# Patient Record
Sex: Female | Born: 1960 | Race: White | Hispanic: No | Marital: Married | Smoking: Current every day smoker
Health system: Southern US, Community
[De-identification: ages and names within clinical notes are randomized; demographics above are authoritative.]

## PROBLEM LIST (undated history)

## (undated) DIAGNOSIS — J069 Acute upper respiratory infection, unspecified: Secondary | ICD-10-CM

## (undated) DIAGNOSIS — F419 Anxiety disorder, unspecified: Secondary | ICD-10-CM

## (undated) DIAGNOSIS — F32A Depression, unspecified: Secondary | ICD-10-CM

## (undated) DIAGNOSIS — M549 Dorsalgia, unspecified: Secondary | ICD-10-CM

## (undated) DIAGNOSIS — E785 Hyperlipidemia, unspecified: Secondary | ICD-10-CM

## (undated) DIAGNOSIS — E010 Iodine-deficiency related diffuse (endemic) goiter: Secondary | ICD-10-CM

## (undated) DIAGNOSIS — M5030 Other cervical disc degeneration, unspecified cervical region: Secondary | ICD-10-CM

## (undated) DIAGNOSIS — Z720 Tobacco use: Secondary | ICD-10-CM

## (undated) DIAGNOSIS — M519 Unspecified thoracic, thoracolumbar and lumbosacral intervertebral disc disorder: Secondary | ICD-10-CM

## (undated) DIAGNOSIS — E041 Nontoxic single thyroid nodule: Secondary | ICD-10-CM

## (undated) DIAGNOSIS — M545 Low back pain, unspecified: Secondary | ICD-10-CM

## (undated) DIAGNOSIS — F410 Panic disorder [episodic paroxysmal anxiety] without agoraphobia: Secondary | ICD-10-CM

## (undated) HISTORY — DX: Anxiety disorder, unspecified: F41.9

## (undated) HISTORY — DX: Other cervical disc degeneration, unspecified cervical region: M50.30

## (undated) HISTORY — DX: Depression, unspecified: F32.A

## (undated) HISTORY — DX: Hyperlipidemia, unspecified: E78.5

## (undated) HISTORY — DX: Unspecified thoracic, thoracolumbar and lumbosacral intervertebral disc disorder: M51.9

## (undated) HISTORY — DX: Iodine-deficiency related diffuse (endemic) goiter: E01.0

## (undated) HISTORY — DX: Nontoxic single thyroid nodule: E04.1

## (undated) HISTORY — DX: Panic disorder (episodic paroxysmal anxiety): F41.0

## (undated) HISTORY — DX: Tobacco use: Z72.0

## (undated) HISTORY — DX: Low back pain, unspecified: M54.50

## (undated) HISTORY — PX: NECK SURGERY: SHX720

## (undated) HISTORY — PX: ABDOMINAL HYSTERECTOMY: SHX81

## (undated) HISTORY — DX: Acute upper respiratory infection, unspecified: J06.9

## (undated) HISTORY — DX: Dorsalgia, unspecified: M54.9

## (undated) HISTORY — PX: BRAIN SURGERY: SHX531

## (undated) HISTORY — PX: TONSILLECTOMY AND ADENOIDECTOMY: SUR1326

---

## 2011-09-24 ENCOUNTER — Encounter: Payer: Self-pay | Admitting: Gastroenterology

## 2011-09-25 ENCOUNTER — Encounter: Payer: Self-pay | Admitting: Gastroenterology

## 2011-10-14 ENCOUNTER — Encounter: Payer: Self-pay | Admitting: Neurosurgery

## 2011-10-15 ENCOUNTER — Ambulatory Visit: Payer: Self-pay | Admitting: Neurosurgery

## 2011-10-15 ENCOUNTER — Encounter: Payer: Self-pay | Admitting: Neurosurgery

## 2011-10-15 VITALS — BP 94/56 | HR 76 | Ht 64.0 in | Wt 151.0 lb

## 2011-10-15 DIAGNOSIS — M5412 Radiculopathy, cervical region: Secondary | ICD-10-CM

## 2011-10-15 DIAGNOSIS — M545 Low back pain, unspecified: Secondary | ICD-10-CM

## 2011-10-15 NOTE — H&P (Signed)
History and Physical    HISTORY:  Chief Complaint   Patient presents with   . Back Pain         History of Present Illness:    HPI56 year old with a multiyear history of low back and neck pain.  Over the last few years the pain has progressively worsened.  She feels the neck pain is related to back pain, which bothers her more.  Back pain radiates to her left leg; anterior/lateral thigh, calf and into the toes which are numb.  The pain is there always, but is episodically more severe.  It is during these spikes in severity that she feels the leg is bad.  She has fallen at times when her left leg gives out due to pain.  With respect to her neck, the pain is, again always there, but worse when her back pain escalates.  The pain usually radiates to both scapulae, left greater than right and over her triceps.  She feels like her arms frequently "go numb".   She is here for surgical evaluation.    Problems:  There is no problem list on file for this patient.       Past Medical/Surgical History:   Past Medical History   Diagnosis Date   . Lumbar disc disease    . Disc disease, degenerative, cervical    . Depression    . Anxiety    . Hypertension    . Low back pain    . Hyperlipidemia    . Panic disorder    . Thyroid nodule    . Thyromegaly    . Tobacco abuse      Past Surgical History   Procedure Date   . Hysterectomy    . Ovary removal          Allergies:    Allergies   Allergen Reactions   . Codeine    . Morphine Sulfate    . Motrin (Ibuprofen Micronized)    . Percocet (Oxycodone-Acetaminophen)    . Vicodin (Hydrocodone-Acetaminophen)        Current medications:    Current Outpatient Prescriptions   Medication Sig   . cyclobenzaprine (FLEXERIL) 10 MG tablet Take 10 mg by mouth 3 times daily as needed       . cetirizine (ZYRTEC) 10 MG tablet Take 10 mg by mouth nightly       . ALPRAZolam (XANAX) 1 MG tablet Take 1 mg by mouth 4 times daily as needed       . zolpidem (AMBIEN) 10 MG tablet Take 10 mg by mouth nightly as  needed       . citalopram (CELEXA) 40 MG tablet Take 60 mg by mouth daily       . acetaminophen (TYLENOL) 500 mg tablet Take 500 mg by mouth Q4-6H PRN           Family History:    No family history on file.    Social/Occupational History:   History     Social History   . Marital Status: Married     Spouse Name: N/A     Number of Children: N/A   . Years of Education: N/A     Social History Main Topics   . Smoking status: Current Everyday Smoker -- 5.0 packs/day for 30 years     Types: Cigarettes   . Smokeless tobacco: Not on file   . Alcohol Use: Not on file   . Drug Use: Not on file   .  Sexually Active: Not on file     Other Topics Concern   . Not on file     Social History Narrative   . No narrative on file         Review of Systems:    Review of Systems   HENT: Positive for neck pain.    Gastrointestinal: Positive for heartburn.   Musculoskeletal: Positive for back pain, joint pain and falls.   Skin: Positive for itching.   Neurological: Positive for tingling, sensory change and headaches.         PHYSICAL EXAM:  Physical Exam   Constitutional: She appears well-developed.   HENT:   Head: Atraumatic.   Eyes: Pupils are equal, round, and reactive to light.   Musculoskeletal: She exhibits no edema and no tenderness.   Neurological: She is alert. She has normal reflexes. No sensory deficit. She exhibits normal muscle tone. Gait abnormal. Coordination normal.        4/5 in left triceps and biceps.  Left leg mildly weaker than the right; testing evokes discomfort.  Walks with antalgic gait.   Skin: Skin is warm.   Psychiatric: Her behavior is normal.       Vital Signs:   BP 94/56  Pulse 76  Ht 1.626 m (5\' 4" )  Wt 68.493 kg (151 lb)  BMI 25.92 kg/m2    Assessment:    There are no diagnoses linked to this encounter.   .      Plan:       *surgical evaluation*

## 2011-10-16 ENCOUNTER — Other Ambulatory Visit: Payer: Self-pay | Admitting: Neurosurgery

## 2011-10-16 DIAGNOSIS — M542 Cervicalgia: Secondary | ICD-10-CM

## 2011-10-16 NOTE — Letter (Signed)
October 15, 2011    Shari Prows, NP  [address]  [place], [place] [address]    RE:   [name], [name]  DOB:  [birth year]  Unit#: [id number]    Dear Barkley Bruns:    I have just seen Ms. Krystal Kramer.  She is a very nice lady with a long history  of back pain for 25+ years, neck pain in the last several years.  Again,  she is a difficult historian, as she associates her back and neck pain  strongly.  She states her back pain comes on and brings on her neck pain  and left entire body numbness and weakness.  She does note she does have  neck pain that shoots into her left arm.  Again, she notes weakness in her  left arm.    On exam, she does appear to have some weakness in the left biceps, wrist  extension, and wrist flexion.    Her MRI of the lumbar spine is frankly unremarkable but she does have what  appears to be some eccentric disk bulge to the left at C5-C6 with potential  foraminal compromise.  Also, she states she has been diagnosed with carpal  tunnel syndrome before.    Overall, given [name]'s somewhat confusing history but concerning  objective findings, I would like to get a better detailed anatomic study  with CT myelography.  I also would like to get an EMG of her upper  extremities to evaluate for the severity of her nerve compression  peripherally in this somewhat confusing patient.  I certainly think her  lumbar spine is relatively unremarkable with a small bulge at L4-L5, which  does not explain some of her dramatic symptoms.    Thanks so much for referring this very nice lady.    Sincerely,              Electronically Signed and Finalized by  Anna Genre, MD 10/20/2011 13:14  ____________________________________  Anna Genre, MD        DD:   10/15/2011  DT:   10/16/2011  7:37 A  DVI:  [id number]  RER/JR2#[id number]    cc:   *Shari Prows, NP

## 2011-11-28 ENCOUNTER — Ambulatory Visit: Payer: Self-pay | Admitting: Neurosurgery

## 2011-12-10 ENCOUNTER — Telehealth: Payer: Self-pay

## 2011-12-11 ENCOUNTER — Ambulatory Visit
Admit: 2011-12-11 | Discharge: 2011-12-11 | Disposition: A | Discharge status: Home / Self Care | Payer: Self-pay | Source: Ambulatory Visit | Attending: Neurosurgery | Admitting: Neurosurgery

## 2011-12-11 ENCOUNTER — Encounter: Payer: Self-pay | Admitting: Neurosurgery

## 2011-12-11 MED ORDER — MEPERIDINE HCL 75 MG/ML IJ SOLN *I*
75.0000 mg | Freq: Once | INTRAMUSCULAR | Status: AC
Start: 2011-12-11 — End: 2011-12-11
  Administered 2011-12-11: 75 mg via INTRAMUSCULAR

## 2011-12-11 MED ORDER — MIDAZOLAM HCL 2 MG/ML PO SYRP *I*
8.0000 mg | ORAL_SOLUTION | Freq: Once | ORAL | Status: AC
Start: 2011-12-11 — End: 2011-12-11
  Administered 2011-12-11: 8 mg via ORAL

## 2011-12-11 MED ORDER — MEPERIDINE HCL 75 MG/ML IJ SOLN *I*
INTRAMUSCULAR | Status: AC
Start: 2011-12-11 — End: 2011-12-11
  Filled 2011-12-11: qty 1

## 2011-12-11 MED ORDER — MIDAZOLAM HCL 2 MG/ML PO SYRP *I*
ORAL_SOLUTION | ORAL | Status: AC
Start: 2011-12-11 — End: 2011-12-11
  Filled 2011-12-11: qty 5

## 2011-12-16 ENCOUNTER — Ambulatory Visit: Payer: Self-pay | Admitting: Neurosurgery

## 2011-12-17 ENCOUNTER — Telehealth: Payer: Self-pay | Admitting: Neurosurgery

## 2011-12-23 ENCOUNTER — Ambulatory Visit: Payer: Self-pay | Admitting: Neurology

## 2011-12-23 ENCOUNTER — Encounter: Payer: Self-pay | Admitting: Neurology

## 2011-12-26 ENCOUNTER — Encounter: Payer: Self-pay | Admitting: Neurology

## 2012-01-06 ENCOUNTER — Encounter: Payer: Self-pay | Admitting: Neurology

## 2012-01-09 ENCOUNTER — Encounter: Payer: Self-pay | Admitting: Neurosurgery

## 2012-01-09 ENCOUNTER — Ambulatory Visit: Payer: Self-pay | Admitting: Neurosurgery

## 2012-01-09 VITALS — BP 108/70 | HR 100

## 2012-01-09 DIAGNOSIS — M4802 Spinal stenosis, cervical region: Secondary | ICD-10-CM | POA: Insufficient documentation

## 2012-01-09 HISTORY — DX: Spinal stenosis, cervical region: M48.02

## 2012-01-09 NOTE — Progress Notes (Signed)
 Pre op h&p done

## 2012-01-27 NOTE — Preop H&P (Signed)
OUTPATIENT  Chief Complaint: neck, left arm pain    History of Present Illness:  HPI  51 year old with neck pain which usually radiates to both scapulae, left greater than right and over her triceps. She feels like her arms frequently "go numb".  She has weakness in the left biceps, wrist extension, and wrist flexion.  MRI shows a disc bulge at C5-6 to the left.  Foraminal stenosis confirmed with CT myelogram.  Presents for elective surgery.      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  History     Social History    Marital Status: Married     Spouse Name: N/A     Number of Children: N/A    Years of Education: N/A     Social History Main Topics    Smoking status: Current Everyday Smoker -- 5.0 packs/day for 30 years     Types: Cigarettes    Smokeless tobacco: Not on file    Alcohol Use: Not on file    Drug Use: Not on file    Sexually Active: Not on file     Other Topics Concern    Not on file     Social History Narrative    No narrative on file       Allergies:   Allergies   Allergen Reactions    Morphine Sulfate Itching    Codeine Nausea And Vomiting    Motrin (Ibuprofen Micronized) Nausea And Vomiting    Percocet (Oxycodone-Acetaminophen) Nausea And Vomiting    Vicodin (Hydrocodone-Acetaminophen) Nausea And Vomiting       Current Outpatient Prescriptions   Medication    naproxen (NAPROSYN) 375 MG tablet    cyclobenzaprine (FLEXERIL) 10 MG tablet    cetirizine (ZYRTEC) 10 MG tablet    ALPRAZolam (XANAX) 1 MG tablet    zolpidem (AMBIEN) 10 MG tablet    citalopram (CELEXA) 40 MG tablet    acetaminophen (TYLENOL) 500 mg tablet         Review of Systems:   Review of Systems   HENT: Positive for neck pain.    Musculoskeletal: Positive for back pain.       Last Nursing documented pain:        No data found.           Physical Exam   Constitutional: She appears well-developed.   HENT:   Head: Atraumatic.   Eyes: Pupils are equal, round, and reactive to light.   Neck:        ROM limited by pain   Cardiovascular: Normal rate, regular rhythm, normal heart sounds and intact distal pulses.  Exam reveals no gallop and no friction rub.    No murmur heard.  Pulmonary/Chest: Breath sounds normal.   Abdominal: Bowel sounds are normal.   Musculoskeletal: She exhibits no edema and no tenderness.   Neurological: She is alert. She has normal reflexes. No  cranial nerve deficit or sensory deficit. Coordination normal.        Mild weakness left biceps, wrist flex/extension   Skin: Skin is warm.   Psychiatric: Her behavior is normal.       Lab Results: labs done at outside lab; scanned into erecord.    Radiology impressions (last 3 days):  No results found.    Currently Active/Followed Hospital Problems:  There are no hospital problems to display for this patient.      Assessment: 51 year old with C5-6 disc bulge to the left with foraminal impingement    Plan: MISS left posterior C5-6 foraminotomy    Author: Glennie Hawk Karlisa Gaubert, NP  Note created: 01/27/2012  at: 5:32 PM

## 2012-01-27 NOTE — Discharge Instructions (Signed)
CERVICAL SPINE SURGERY (MINIMALLY INVASIVE) DISCHARGE INFORMATION    You can expect to have good and bad days over the next  2 weeks.  The surgical area will have some swelling and soreness which will take about 2 weeks to settle down.  You should gradually begin to feel better.  Nerves heal very slowly; you may actually notice slow improvement for up to over year.        Activity    Do not stay in one position longer than two hours during the day, whether this is sitting or lying down. This could contribute to stiffness and increased pain.      Take frequent walks daily, gradually increasing the time and distance.  Short, more         frequent activities are better tolerated than one long activity      You may go up and down stairs.      Lifting may not exceed thirty pounds for 6 weeks.  Follow this rule:  if it hurts...DON'T do it!!     You may return to work within a week with the above restrictions.       Do not drive for one week or while you are still taking narcotic pain medication.        Wound care    The dressing/bandages should be removed the day after surgery and the incision left open to air.  Do not use creams, lotions, or ointments on the incision.       Your incision was closed with sutures under the skin, the sutures will absorb and do not need to be removed.  There may be steri-strips (thin pieces of tape) on the incision.  These may be removed on the eighth day after your surgery.      The incision should be kept dry for three days.  After 3 days you may shower without protecting the incision.  No tub baths, swimming or hot tub use for 4 weeks after surgery or while on narcotic pain medications.      Comfort    Pain medication will be prescribed to manage your immediate post surgical pain; generally prescriptions are given to get you through until your surgical follow up appointment. Pain medication refills cannot be called in. Please notify your doctor's office 1 week in advance of needing a  refill so the prescription can be mailed to you.  No refills on prescriptions will be given after office hours or on weekends!    Muscle spasms are common with the surgery you had and can be quite painful.  If you experience muscle spasms you may apply ice to your neck area. Be sure to use a towel between the ice and skin to prevent injury to your skin.  You may be given a prescription for a muscle relaxer at discharge.    If you are still having pain after the expected post operative period we may refer you to a pain management group to better treat your pain.    Other    You should drink plenty of fluids and use a stool softener or laxative daily to ensure regular bowel movements after surgery.  These are available at your pharmacy without a prescription.  Do not go more than 2 days without using one of the medications to avoid significant constipation.    Call your physician's office to schedule a post-operative follow-up appointment if one has not been made for you.          CALL YOUR PHYSICIAN IF YOU DEVELOP:    Fever greater than 101.5 for 24 hours, bleeding, redness, swelling or drainage from the incision site.    Difficulty with urination or bowel movements.    Sudden, severe pain that is not relieved with pain medication or new pain, numbness or weakness in your legs.    Difficulty swallowing or breathing.

## 2012-02-06 ENCOUNTER — Ambulatory Visit
Admit: 2012-02-06 | Discharge: 2012-02-06 | Disposition: A | Discharge status: Home / Self Care | Payer: Self-pay | Source: Ambulatory Visit | Attending: Neurosurgery | Admitting: Neurosurgery

## 2012-02-06 MED ORDER — ALPRAZOLAM 0.25 MG PO TABS *I*
ORAL_TABLET | ORAL | Status: DC
Start: 2012-02-06 — End: 2012-02-07
  Filled 2012-02-06: qty 4

## 2012-02-06 MED ORDER — PROMETHAZINE HCL 25 MG/ML IJ SOLN *I*
6.2500 mg | Freq: Once | INTRAMUSCULAR | Status: AC | PRN
Start: 2012-02-06 — End: 2012-02-06
  Administered 2012-02-06: 6.25 mg via INTRAVENOUS

## 2012-02-06 MED ORDER — PROMETHAZINE HCL 25 MG/ML IJ SOLN *I*
INTRAMUSCULAR | Status: DC
Start: 2012-02-06 — End: 2012-02-07
  Filled 2012-02-06: qty 1

## 2012-02-06 MED ORDER — LIDOCAINE HCL 1 % IJ SOLN
0.1000 mL | INTRAMUSCULAR | Status: DC | PRN
Start: 2012-02-06 — End: 2012-02-07
  Administered 2012-02-06: 0.1 mL via SUBCUTANEOUS

## 2012-02-06 MED ORDER — SODIUM CHLORIDE 0.9 % IV SOLN WRAPPED *I*
20.0000 mL/h | Status: DC
Start: 2012-02-06 — End: 2012-02-07
  Administered 2012-02-06: 20 mL/h via INTRAVENOUS

## 2012-02-06 MED ORDER — CEFAZOLIN SODIUM 1000 MG IJ SOLR *I*
2000.0000 mg | INTRAMUSCULAR | Status: AC
Start: 2012-02-06 — End: 2012-02-06
  Administered 2012-02-06: 2000 mg via INTRAVENOUS

## 2012-02-06 MED ORDER — ALPRAZOLAM 0.25 MG PO TABS *I*
1.0000 mg | ORAL_TABLET | Freq: Once | ORAL | Status: AC | PRN
Start: 2012-02-06 — End: 2012-02-06
  Administered 2012-02-06: 1 mg via ORAL

## 2012-02-06 MED ORDER — ONDANSETRON HCL 2 MG/ML IV SOLN *I*
1.0000 mg | Freq: Once | INTRAMUSCULAR | Status: AC | PRN
Start: 2012-02-06 — End: 2012-02-06
  Administered 2012-02-06: 1 mg via INTRAVENOUS

## 2012-02-06 MED ORDER — HYDROMORPHONE HCL 2 MG/ML IJ SOLN *WRAPPED*
0.4000 mg | INTRAMUSCULAR | Status: DC | PRN
Start: 2012-02-06 — End: 2012-02-07
  Administered 2012-02-06: 0.4 mg via INTRAVENOUS

## 2012-02-06 MED ORDER — NAPROXEN 375 MG PO TABS *I*
375.0000 mg | ORAL_TABLET | Freq: Three times a day (TID) | ORAL | Status: DC
Start: 2012-02-06 — End: 2016-08-20

## 2012-02-06 MED ORDER — ONDANSETRON HCL 2 MG/ML IV SOLN *I*
INTRAMUSCULAR | Status: AC
Start: 2012-02-06 — End: 2012-02-06
  Filled 2012-02-06: qty 2

## 2012-02-06 MED ORDER — HYDROMORPHONE HCL PF 1 MG/ML IJ SOLN *WRAPPED*
INTRAMUSCULAR | Status: DC
Start: 2012-02-06 — End: 2012-02-07
  Filled 2012-02-06: qty 1

## 2012-02-06 NOTE — INTERIM OP NOTE (Signed)
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

## 2012-02-06 NOTE — Anesthesia Post-procedure Eval (Signed)
Anesthesia Post-op Note    Patient: Liliah Torchio    Procedure(s) Performed: Cervical formaotomy    Anesthesia type: General    Patient location: PACU    Mental Status: Recovered to baseline    Patient able to participate in this evaluation: yes  Last Vitals: BP: 133/78 mmHg (02/06/12 1800)  BP MAP : 91 mmHg (02/06/12 1800)  Heart Rate: 96  (02/06/12 1800)  Temp: 36 C (96.8 F) (02/06/12 1758)  Resp: 14  (02/06/12 1758)  Height: 162 cm (5' 3.78") (02/06/12 1300)  weight: 71.1 kg (156 lb 12 oz) (02/06/12 1300)  BMI (Calculated): 27.1  (02/06/12 1300)  SpO2: 98 % (02/06/12 1800)      Post-op vital signs noted above are within patient's normal range  Post-op vitals signs: stable  Respiratory function: baseline    Airway patent: Yes    Cardiovascular and hydration status stable: Yes    Post-Op pain: Adequate analgesia    Post-Op assessment: no apparent anesthetic complications, tolerated procedure well and no evidence of recall    Complications: none    Attending Attestation: All indicated post anesthesia care provided    Author: Lenon Oms, MD  as of: 02/06/2012  at: 6:10 PM

## 2012-02-06 NOTE — Progress Notes (Addendum)
1910:  Patient returned from PACU via stretcher.  Report obtained from PACU RN.  Patient awake and vital signs stable.  Patient assisted out of bed to chair.  IV patent.  Patient with complaints of nausea.  Patient c/o 6/10 neck pain.  Incision site benign with dermabond intact.   Starting po.  Family visiting with patient.  Will continue to monitor.   1920:  Patient states she feels like "I can't take a deep breath".  Incentive spirometer teaching done.  Patient states current 1/2 pack/day smoker.  Sleepy in recliner.  Anes. Grimmes Notified.  Medicated for pain & nausea.  2L nasal cannula applied for comfort.  O2 sat 97%.  Appears comfortable.    1950:  Patient states pain is better 0/10.  Sleepy using incentive spirometer.  1L nasal cannula 97%  No SOB.    2000:  Resident called for patients usual anxiety medication xanax.  Patient resting comfortably no pain.  Requesting prescription, resident states plan was to send home with Tylenol and flexeril.  Patient has several narcotic allergies.  2100:  Patient states readiness for discharge.  IV discontinued.  Patient tolerating good oral intake.  Patient denies nausea.  Patient denies pain.  Patient assisted out of bed with steady gait.  Voided good.  Discharge instructions reviewed with patient and husband. Verbalized good understanding.   Chester Holstein, BSN, Charity fundraiser, [employer]

## 2012-02-06 NOTE — H&P (Signed)
UPDATES TO PATIENT'S CONDITION on the DAY OF SURGERY/PROCEDURE    I. Updates to Patient's Condition (to be completed by a provider privileged to complete a H&P, following reassessment of the patient by the provider):    Day of Surgery/Procedure Update:  History  History reviewed and no change    Physical  Physical exam updated and no change              II. Procedure Readiness   I have reviewed the patient's H&P and updated condition. By completing and signing this form, I attest that this patient is ready for surgery/procedure.      III. Attestation   I have reviewed the updated information regarding the patient's condition and it is appropriate to proceed with the planned surgery/procedure.    Junius Argyle Rickie Gange as of 3:35 PM 02/06/2012

## 2012-02-06 NOTE — Anesthesia Pre-procedure Eval (Signed)
Anesthesia Pre-operative Evaluation for Krystal Kramer    51 year old female with cervical degenerative disc disease with associated formainal stenosis, presents for left C5-C6 posterior foraminotomy.    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     Past Surgical History   Procedure Date    Hysterectomy     Ovary removal      Social History  History   Substance Use Topics    Smoking status: Current Everyday Smoker -- 5.0 packs/day for 30 years     Types: Cigarettes    Smokeless tobacco: Not on file    Alcohol Use: Not on file      History   Drug Use Not on file     ______________________________________________________________________  Allergies:   Allergies   Allergen Reactions    Morphine Sulfate Itching    Codeine Nausea And Vomiting    Environmental Allergies Itching     [place], pets, and dust cause watery, itchy eyes, and stuffy nose     Motrin (Ibuprofen Micronized) Nausea And Vomiting    Percocet (Oxycodone-Acetaminophen) Nausea And Vomiting    Vicodin (Hydrocodone-Acetaminophen) Nausea And Vomiting      Cannot display prior to admission medications because the patient has not been admitted in this contact.        Current Outpatient Prescriptions   Medication Sig    cyclobenzaprine (FLEXERIL) 10 MG tablet Take 10 mg by mouth 3 times daily as needed        cetirizine (ZYRTEC) 10 MG tablet Take 10 mg by mouth nightly        ALPRAZolam (XANAX) 1 MG tablet Take 1 mg by mouth 4 times daily as needed        zolpidem (AMBIEN) 10 MG tablet Take 10 mg by mouth nightly as needed        citalopram (CELEXA) 40 MG tablet Take 60 mg by mouth daily        acetaminophen (TYLENOL) 500 mg tablet Take 500 mg by mouth Q4-6H PRN        naproxen  (NAPROSYN) 375 MG tablet Take 1 tablet (375 mg total) by mouth 3 times daily (with meals)   You may resume this in one week.     Current Facility-Administered Medications   Medication    sodium chloride IV    lidocaine 1 % injection 0.1 mL    ceFAZolin (ANCEF) injection 2,000 mg     Admission Medications:  Scheduled Meds        cefazolin IV     IV Meds   PRN Meds        lidocaine 0.1 mL at 02/06/12 1348     Anesthesia EvaluationInformation Source: per records  General  Pertinent (-):  history of anesthetic complications, FamHx of anesthetic complications, contact precautions, anesthesia monitoring restrictions or communication issues    HEENT  Pertinent (-):   anatomic issues    Pulmonary     + Smoker (150 pack year smoker)            currently  Pertinent (-):  asthma  or sleep apnea Cardiovascular  Good(4+METs) Exercise Tolerance  Pertinent (-):  hypertension, angina, CAD or valvular disease    Comment:H/o HLD    GI/Hepatic/Renal  Last PO Intake: >8hr before procedure      + GERD            well controlled  Pertinent (-):  liver  issues or renal issues Neuro/Psych    + Chronic pain          lower back    + Psychiatric Issues (h/o panic d/o; takes xanax PRN and celexa)          anxiety, depression    + Neuromuscular disease (neuropathy of the upper extremities bilaterally )  Pertinent (-):  dizziness/motion sickness    Comment: H/o degenerative disc disease (cervical), cervical foraminal stenosis, and lumbar disc disease    Endo/Other    +Thyroid Disease (h/o thyroid nodule and thyromegaly; not currently on any medications)  Pertinent (-):  diabetes mellitus         Nursing Reported PO Status: Date Last PO Fluids: 02/06/12 1215  Date Last PO Solids: 02/05/12 2000  ______________________________________________________________________  Physical Exam    Airway            Mouth opening: normal            Mallampati: II            TM distance (fb): >3 FB            Neck ROM: full  Dental   Normal Exam   Upper:  dentures   Cardiovascular           Rhythm: regular           Rate: normal  No murmur, friction rub or systolic click     Pulmonary     breath sounds clear to auscultation    No cough, rhonchi, decreased breath sounds, wheezes    Mental Status     oriented to person, place and time    depressed    Not confused, anxious, depressed         Most Recent Vitals: BP: 120/76 mmHg (02/06/12 1344)  Heart Rate: 101  (02/06/12 1300)  Temp: 37.1 C (98.8 F) (02/06/12 1300)  Resp: 18  (02/06/12 1300)  Height: 162 cm (5' 3.78") (02/06/12 1300)  weight: 71.1 kg (156 lb 12 oz) (02/06/12 1300)  BMI (Calculated): 27.1  (02/06/12 1300)  SpO2: 96 % (02/06/12 1300)    Vital Sign Ranges (last 24hrs)  Temp:  [37.1 C (98.8 F)] 37.1 C (98.8 F)  Heart Rate:  [101] 101   Resp:  [18] 18   BP: (120)/(76) 120/76 mmHg   O2 Device: None (Room air) (02/06/12 1344)    _______________________________________________________________________  Medical Problems  Patient Active Problem List   Diagnoses Date Noted    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       PreOp/PreProcedure Diagnosis (For more detail see procedural consent)            C5-6 degenerative disc disease with formainal stenosis  Planned Procedure (For more detail see procedural consent)            Left C5-C6 posterior foraminotomy  Plan   ASA Score 2  Anesthetic Plan (general); Induction (routine IV); Airway (cuffed ETT); Line ( use current access); Monitoring (standard ASA); Positioning (prone); Pain (per surgical team); PostOp (PACU)    Informed Consent     Risks:  Risks discussed were commensurate with the plan listed above with the following specific points:  N/V, sore throat , damage to:(nerves, teeth, eyes), allergic Rx, unexpected serious injury    Anesthetic Consent:         Anesthetic plan and risks discussed with:  patient      Attending Attestation: The patient or proxy understand and accept the risks and benefits of the anesthesia plan. By accepting this  note, I attest that I have personally performed the history and physical exam and prescribed the anesthetic plan within 48 hours prior to the anesthetic as documented by me above.    Author: Lenon Oms, MD

## 2012-02-06 NOTE — Progress Notes (Signed)
ARRIVES FROM PACU UP TO RECLINER.  DRESSING DERMABOND ON BACK OF NECK 1 INCH LONG DRY AND INTACT.  C/O NAUSEA AND PAIN. PT TO BE MEDICATED WITH DILAUDID AND PAIN. 6/10

## 2012-02-10 MED FILL — Lorazepam Inj 2 MG/ML: INTRAMUSCULAR | Qty: 1 | Status: AC

## 2012-02-10 MED FILL — Propofol IV Emul 10 MG/ML: INTRAVENOUS | Qty: 20 | Status: AC

## 2012-02-10 MED FILL — Midazolam HCl Inj 2 MG/2ML (Base Equivalent): INTRAMUSCULAR | Qty: 2 | Status: AC

## 2012-02-10 MED FILL — Ondansetron HCl Inj 4 MG/2ML (2 MG/ML): INTRAMUSCULAR | Qty: 2 | Status: AC

## 2012-02-10 MED FILL — Metoprolol Tartrate IV Soln 5 MG/5ML: INTRAVENOUS | Qty: 5 | Status: AC

## 2012-02-10 MED FILL — Glycopyrrolate Inj 0.2 MG/ML: INTRAMUSCULAR | Qty: 2 | Status: AC

## 2012-02-10 MED FILL — Thrombin For Soln 5000 Unit: CUTANEOUS | Qty: 5000 | Status: AC

## 2012-02-10 MED FILL — Bupivacaine HCl Inj 0.25%: INTRAMUSCULAR | Qty: 30 | Status: AC

## 2012-02-10 MED FILL — Rocuronium Bromide IV Soln 50 MG/5ML (10 MG/ML): INTRAVENOUS | Qty: 5 | Status: AC

## 2012-02-10 MED FILL — Methylprednisolone Acetate PF Inj Susp 80 MG/ML: INTRAMUSCULAR | Qty: 1 | Status: AC

## 2012-02-10 MED FILL — Fentanyl Citrate Inj 0.05 MG/ML: INTRAMUSCULAR | Qty: 5 | Status: AC

## 2012-02-10 MED FILL — Lidocaine Inj 1% w/ Epinephrine-1:100000: INTRAMUSCULAR | Qty: 50 | Status: AC

## 2012-02-10 MED FILL — Lidocaine HCl Local Preservative Free (PF) Inj 2%: INTRAMUSCULAR | Qty: 5 | Status: AC

## 2012-02-10 MED FILL — Ketorolac Tromethamine Inj 30 MG/ML: INTRAMUSCULAR | Qty: 1 | Status: AC

## 2012-02-10 NOTE — Op Note (Signed)
Krystal Kramer, Krystal Kramer  MR #:  [id number]   ACCOUNT #:  [id number] DOB:  [birth year]    AGE:  51     SURGEON:  Anna Genre, MD  CO-SURGEON:    ASSISTANT:  Gerlene Burdock, MD,RES  SURGERY DATE:  02/06/2012    DIAGNOSIS:  Left C5-C6 foraminal stenosis.    OPERATIVE PROCEDURE:    1. Left C5-C6 minimally invasive foraminotomy.   2. Use of intraoperative microscope.    HISTORY:  The patient is a woman with history of neck and left arm pain, cervical myelogram demonstrating severe foraminal stenosis, left C5-C6, planned for foraminotomy.    DESCRIPTION OF PROCEDURE:  After obtaining informed consent, the patient was taken to the operative suite.  She was placed in general endotracheal anesthesia in [place] fixation pins and flipped prone with the neck slightly flexed and held in place.  The back and neck were then prepped and draped in a sterile manner.       A lumbar spinal needle was then placed through use of lateral fluoroscopy to confirm the level to be correct at C5-C6.  A small, approximately 12 mm, linear skin incision was then made to the left of midline at C5-C6 after infiltrating with 1% lidocaine with 1:100,000 dilution epinephrine.  Skin incision made down through the skin, subcutaneous tissues, and posterior cervical fascia.  Dilator was then placed onto the lamina-facet junction at C5-C6.  Finally, a 12 mm diameter access port was docked at C5-C6 and held in place with self-retaining retraction system.       At this point, the high-power microscope was then brought into the field.  The small plug of tissue at the base of the exposure was then removed.  Hemilaminotomies at C5-C6 were then performed with high-powered air drill and Kerrison punches.  By progressively angling the retractor more lateral, the medial facet was identified and drilled, as well as using Kerrison punches to create a medial facetectomy.  Ready visualization of the lateral aspect of thecal sac and the nerve root was  readily identified and noted to be quite tight in the foramen until this was opened.  Once this was decompressed with Kerrison rongeurs, the nerve root was noted to be free from compression on inspection and palpation.  At this point, immaculate epidural hemostasis was then achieved and the wound then closed in layers, with interrupted 2-0 Vicryl sutures to the lumbodorsal fascia.  The paraspinous musculature infiltrated with 0.25% Marcaine, 2 mg of Ativan, 3 mg of Toradol, 10 cc total volume.  Subcutaneous tissue closed with 2-0 Vicryl sutures, 4-0 running intradermal suture used, and then Dermabond placed to the skin.  Wound dressed sterilely.  Patient taken in stable condition to recovery.       It should be noted, a pledget Gelfoam soaked in Depo-Medrol placed over the exposed nerve root as well.    PREOPERATIVE DIAGNOSIS:      ANESTHESIA:               ______________________________  Anna Genre, MD    RER/MODL  DD:  02/10/2012 10:56:07  DT:  02/10/2012 13:38:14  Job #:  [id number]

## 2012-03-16 ENCOUNTER — Encounter: Payer: Self-pay | Admitting: Neurosurgery

## 2012-03-23 ENCOUNTER — Encounter: Payer: Self-pay | Admitting: Neurosurgery

## 2012-03-23 ENCOUNTER — Ambulatory Visit: Payer: Self-pay | Admitting: Neurosurgery

## 2012-03-23 VITALS — BP 110/62 | HR 76 | Ht 64.0 in | Wt 161.0 lb

## 2012-03-23 DIAGNOSIS — M4802 Spinal stenosis, cervical region: Secondary | ICD-10-CM

## 2012-03-23 NOTE — Progress Notes (Signed)
Dear Dr Marnette Burgess,    I had the pleasure of seeing Krystal Kramer in our neurosurgery clinic today.  She is 6 weeks post Left C5-C6 minimally invasive foraminotomy.      [name] says her neck pain and arm symptoms have resolved.  Her only complaint is her low back pain radiating to the left leg which she has had for over a year.      [name]'s incision is completely healed.  She had 5/5 strength in both upper extremities.    [name] will return in June for follow up on her back.  At that point, after she is a few months post op, we will look at her lumbar spine and see if more imaging is indicated.    Thank you for the opportunity to participate in the care of your patient.  If you have any questions or concerns please don't hesitate to call our office.    Sincerely,        Aundra Millet, NP

## 2012-04-06 ENCOUNTER — Telehealth: Payer: Self-pay | Admitting: Neurosurgery

## 2012-04-06 NOTE — Telephone Encounter (Signed)
Pt is calling because she is in severe pain and would like to know what she can do to relieve it best... Please advise. Thanks

## 2012-04-06 NOTE — Telephone Encounter (Signed)
Patient says her back "went out".  She has severe stabbing pain in her leg.  Her PCP office advised her to go to ED or see the NP in [place].  Advised her to do either of those.

## 2016-07-16 ENCOUNTER — Encounter: Payer: Self-pay | Admitting: Gastroenterology

## 2016-08-20 ENCOUNTER — Encounter: Payer: Self-pay | Admitting: Neurosurgery

## 2016-08-20 ENCOUNTER — Ambulatory Visit: Payer: Self-pay | Admitting: Neurosurgery

## 2016-08-20 VITALS — BP 96/58 | HR 98 | Ht 64.0 in | Wt 137.0 lb

## 2016-08-20 DIAGNOSIS — M5126 Other intervertebral disc displacement, lumbar region: Secondary | ICD-10-CM

## 2016-08-20 DIAGNOSIS — M5416 Radiculopathy, lumbar region: Secondary | ICD-10-CM

## 2016-08-20 DIAGNOSIS — M48062 Spinal stenosis, lumbar region with neurogenic claudication: Secondary | ICD-10-CM

## 2016-08-20 NOTE — Progress Notes (Signed)
,          Tama Gander, DO  NEUROSURGERY     Brain, Complex spine and Minimally Invasive Surgery.    [place],  [address]  Ph: [phone]  Fax: [phone]             08/20/16      RE:  Krystal Kramer, December 04, 1960    Dear Dr. Marlane Mingle,    I had the pleasure of seeing Krystal Kramer today 08/20/2016  In consultation at my [place], [place] office with regards to low back pain.    Krystal Kramer is a 55 y.o. right-handed female with history of posterior cervical foraminotomy by Dr Marcille Blanco in 2013.    She is here today for evaluation of low back and leg pain. She complains of low back and leg pain for few months, she complains of right anterior/lateral thigh pain but also complains of b/l posterior leg pain that is worse with standing and walking. Leg pain is >> back pain. She denies any preceding trauma. She denies numbness/weakness in the legs. Rest and heat usually eases the pain. Rates pain 8/10, dull constant ache, over the last few weakness she has noted some paresthesia in the legs.    She did not have any conservative treatment so far. No physical therapy or pain injections so far.        No weakness, numbness, tingling, fever, new bowel or bladder problems.    Past Medical History:   Past Medical History:   Diagnosis Date    Anxiety     Depression     Disc disease, degenerative, cervical     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    Hyperlipidemia     Low back pain     Lumbar disc disease     Panic disorder     Thyroid nodule     Thyromegaly     Tobacco abuse      Past Surgical History:   Past Surgical History:   Procedure Laterality Date    bone spur      HYSTERECTOMY      OVARY REMOVAL      RECTAL SURGERY      TONSILLECTOMY AND ADENOIDECTOMY       Allergies:   Allergies   Allergen Reactions    Morphine Sulfate Itching    Codeine Nausea And Vomiting    Environmental Allergies Itching     Pollan, pets, and dust cause watery, itchy  eyes, and stuffy nose     Motrin [Ibuprofen Micronized] Nausea And Vomiting    Percocet [Oxycodone-Acetaminophen] Nausea And Vomiting    Vicodin [Hydrocodone-Acetaminophen] Nausea And Vomiting    Darvon [Propoxyphene] Other (See Comments)     Darvocet n       Medications:  Current Outpatient Prescriptions   Medication    citalopram (CELEXA) 40 MG tablet    ALPRAZolam (XANAX) 2 MG tablet    QUEtiapine (SEROQUEL) 100 MG tablet    gabapentin (NEURONTIN) 300 MG capsule     No current facility-administered medications for this visit.      Social History:  Social History     Social History    Marital status: Married     Spouse name: N/A    Number of children: N/A    Years of education: N/A     Social History Main Topics    Smoking status: Current Every Day Smoker  Packs/day: 5.00     Years: 30.00     Types: Cigarettes    Smokeless tobacco: Never Used    Alcohol use None    Drug use: None    Sexual activity: Not Asked     Other Topics Concern    None     Social History Narrative     Family History:  No family history on file.    Review of Symptoms:  Twelve systems have been reviewed and pertinent positives noted in HPI.  All other systems negative.    BP 96/58   Pulse 98   Ht 1.626 m (5\' 4" )   Wt 62.1 kg (137 lb)   BMI 23.52 kg/m2   Pain    08/20/16 1328   PainSc:   3   PainLoc: Back     Physical Exam:  Well-developed. No acute distress.  Eyes:  EOMI, sclera clear  Neck: Supple.  Non-tender. no pain with motion    Cardiovascular:  No cyanosis, good capillary refill.  Pulmonary:  Clear, even, and easy respirations.    Skin:  Warm, dry, and pink.  Musculoskeletal:   Normal tone.  No atrophy.  No fasciculations.       Neurological exam:      Alert and oriented to person, place and time.  Speech is clear and fluent.    Cranial nerves II-XII intact.      Strength:     Upper Extremities Deltoid Biceps Triceps WE WF Int   Right 5 5 5 5 5 5    Left 5 5 5 5 5 5      Lower Extremities Hip Flexion Knee Flexion Knee  Extension DF PF EHL   Right 5 5 5 5 5 5    Left 5 5 5 5 5 5        Sensory exam normal    Gait is narrow-based and steady.      DTR 2+ biceps, 2+ triceps, 2+ brachioradialis,     2+ patellar, 2+ Achilles'.      No clonus.  Hoffman's negative.      Toes down.  Romberg negative.  No dysmetria.      Positive right SLR.    Imaging:     I personally reviewed the MRI of lumbar spine dated 07-02-2016 that shows L2-3 herniated disc causing right lateral recess stenosis and moderate stenosis at L4-5     Impression/Plan: 55 y.o. female with lumbar stenosis and radiculopathy,    I reviewed the MRI with her , her right leg pain is concordant with right L 2-3 HNP, she also has some claudication symptoms.    I recommend conservative treatment first as she had not had any treatment so far. I gave referrals for PT and PMR.    She will follow  Up with me after the above, if she is not better then she will be a candidate for surgery.    Should you have any questions regarding my plan, please do not hesitate to contact me personally.    Thank you for this opportunity to care for your patient.    Sincerely,    Ocie Doyne, DO  08/20/16  2:21 PM

## 2017-03-06 ENCOUNTER — Encounter: Payer: Self-pay | Admitting: Gastroenterology

## 2017-03-11 ENCOUNTER — Ambulatory Visit: Payer: No Typology Code available for payment source | Attending: Internal Medicine | Admitting: Surgery

## 2017-03-11 ENCOUNTER — Encounter: Payer: Self-pay | Admitting: Surgery

## 2017-03-11 VITALS — BP 115/65 | HR 72 | Temp 98.3°F | Ht 64.0 in | Wt 159.7 lb

## 2017-03-11 DIAGNOSIS — N823 Fistula of vagina to large intestine: Secondary | ICD-10-CM

## 2017-03-11 NOTE — Progress Notes (Signed)
Dear Krystal Kramer,     Thank you for this request of surgical consultation. As you know, Krystal Kramer is a pleasant 56 y.o. 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  She did have episiotomy after the birth of her last children and had vaginal deliveries for all her children.  She thinks that she has had 2 procedures but does not remember who did them were exactly where or when.  Patient does endorse having 3 bowel movements a week which are formed but always stool evacuate some of the vagina and the rectum.  Patient has no colonoscopy on file.    Allergies:  She is allergic to morphine sulfate; codeine; environmental allergies; motrin [ibuprofen micronized]; percocet [oxycodone-acetaminophen]; vicodin [hydrocodone-acetaminophen]; and darvon [propoxyphene].    Medications:  She has a current medication list which includes the following prescription(s): citalopram, alprazolam, quetiapine, gabapentin, cyclobenzaprine, famotidine, meclizine, and simvastatin.    Past Medical History, Surgical History, Family History, and Social History had been reviewed.  Past surgical history of hysterectomy in 2008, and tonsillectomy in 2017.    Review of Systems:   Remainder of ten system ROS is negative    Physical Exam:  Her height is 1.626 m (5\' 4" ) and weight is 72.4 kg (159 lb 11.2 oz). Her temporal temperature is 36.8 C (98.3 F). Her blood pressure is 115/65 and her pulse is 72. Her oxygen saturation is 96%.     General: Well-appearing in no acute distress  HEENT: Sclerae anicteric, extraocular muscles are intact, mucous membranes are moist  Neck: Supple without lymphadenopathy or JVD  Cardio: Regular rate and rhythm, no murmurs, rubs, or gallops  Chest: Clear to auscultation bilaterally  Abdomen: Soft, nontender, nondistended  Extremities: Warm bilaterally without edema, cyanosis, or clubbing  Back: No spinous process tenderness, no  paraspinal muscle tenderness  Neuro: Alert oriented x 3, cranial nerves II through XII grossly intact    Rectal:  Evidence of scar on perineum consistent with episiotomy.  DRE moderate tone.  Anteriorly in the sphincter there feels to be a divit.  No masses.  No palpable defect in posterior vagina  Anoscopy: Normal mucosa, no evidence of obvious internal opening although only gently probed with a q-tip.      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  She needs to have a colonoscopy.  We will need to do an endorectal ultrasound and subsequently an EUA.  We willl then make plans for repair afterwards.  However, prior to surgery, she will need to stop smoking for at least 4 weeks to optimize the outcome.      Once again, thank you Dr.Ramaswamy for this request of surgical consultation.  Please feel free to contact our office with any questions or concerns.      With best personal regards,      Esau Grew , MD, MSc, Allegra Grana, FASCRS  Division of Colorectal Surgery Chief   Professor of Surgical Oncology

## 2017-04-18 ENCOUNTER — Telehealth: Payer: Self-pay | Admitting: Surgery

## 2017-04-18 NOTE — Telephone Encounter (Signed)
Left message for patient that appointment on 5/24 at 2:30pm has been moved to [facility].  Asked to call back to confirm new time

## 2017-04-24 ENCOUNTER — Ambulatory Visit: Payer: No Typology Code available for payment source | Admitting: Surgery

## 2017-05-21 ENCOUNTER — Telehealth: Payer: Self-pay | Admitting: Surgery

## 2017-05-21 NOTE — Telephone Encounter (Signed)
-----   Message from Jackquline Bosch, NP sent at 05/20/2017 11:15 AM EDT -----  Butch Penny,    Please schedule a colonoscopy for this patient. 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  Thanks

## 2017-05-21 NOTE — Telephone Encounter (Signed)
Spoke to pt who didn`t wish to schedule their Colonoscopy as per recommendation below.

## 2017-08-15 ENCOUNTER — Telehealth: Payer: Self-pay | Admitting: Surgery

## 2017-08-15 NOTE — Telephone Encounter (Signed)
Contacted patient to inquire how she was doing and if she was interested in pursuing care with our group. She was initially seen and recommended to have a colonoscopy, rectal [place] and to quit smoking in preparation for repair of her colovaginal fistula. She did not  respond to our attempts to schedule a colonoscopy. Today she simply stated that she sis not want to h ave care with out group . She is seeing another provider. I states that was fine and should she chang her mind to call anytime.

## 2017-11-18 ENCOUNTER — Encounter: Payer: Self-pay | Admitting: Gastroenterology

## 2017-12-03 ENCOUNTER — Ambulatory Visit: Payer: No Typology Code available for payment source | Admitting: Surgery

## 2018-09-03 ENCOUNTER — Encounter: Payer: Self-pay | Admitting: Gastroenterology

## 2018-09-11 ENCOUNTER — Encounter: Payer: Self-pay | Admitting: Neurological Surgery

## 2018-09-11 ENCOUNTER — Ambulatory Visit: Payer: No Typology Code available for payment source | Attending: Neurological Surgery | Admitting: Neurological Surgery

## 2018-09-11 VITALS — BP 112/63 | HR 93 | Ht 64.0 in | Wt 178.0 lb

## 2018-09-11 DIAGNOSIS — D329 Benign neoplasm of meninges, unspecified: Secondary | ICD-10-CM

## 2018-09-11 NOTE — Progress Notes (Signed)
Dear Dr. Chase Caller,    I had the pleasure of seeing Krystal Kramer today.  I appreciate you sending her to see me.    History:     Samiya Mervin is a 57 y.o. female who is left handed. Patient presents with the chief complaint of a 2-3 month history of daily headache which occasionally involves the entire head and occasional just the frontal area. Her headaches are at times accompanied by dizziness, nausea and blurred vision. They typically last the entire day. She occasionally experiences mild photophobia.       Current Pain Control and Previous Treatment Modalities:                   Pain    09/11/18 1138   PainSc:   8   PainLoc: Head       Factors which aggravate symptoms: no particular activity    Factors which alleviate or lessen symptoms: Nothing in particular    Previous treatments: Medication-Advil.    Previous Spine Surgery:    Minimally invasive left C5-6 foraminotomy by Dr. Terrial Rhodes on 02/11/2012    Depression and/or Anxiety:  Yes  Medications currently taken for depression/anxiety: Xanax 2 mg TID PRN, Celexa 40 mg daily.        Tobacco History:    History   Smoking Status    Current Every Day Smoker    Packs/day: 1.50    Years: 30.00    Types: Cigarettes   Smokeless Tobacco    Never Used       Alcohol and Drug History:    .  History   Alcohol Use No       History   Drug Use No       Occupation and Outside Interests:    Patient is currently: Not Employed    Hobbies and interests include: inside and outdoor work. Her symptoms are preventing her from enjoying these activities.    Relevant Family and Social History:    Married and currently lives: With spouse             Past Medical History:   Diagnosis Date    Anxiety     Depression     Disc disease, degenerative, cervical     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    Hyperlipidemia     Low back pain     Lumbar disc disease     Panic disorder     Thyroid nodule     Thyromegaly     Tobacco abuse                  Past Surgical History:   Procedure Laterality Date    bone spur      HYSTERECTOMY      OVARY REMOVAL      RECTAL SURGERY      TONSILLECTOMY AND ADENOIDECTOMY                  Allergies   Allergen Reactions    Morphine Sulfate Itching    Codeine Nausea And Vomiting    Environmental Allergies Itching     Pollan, pets, and dust cause watery, itchy eyes, and stuffy nose     Motrin [Ibuprofen Micronized] Nausea And Vomiting    Percocet [Oxycodone-Acetaminophen] Nausea And Vomiting    Vicodin [Hydrocodone-Acetaminophen] Nausea And Vomiting    Darvon [Propoxyphene] Other (See Comments)     Darvocet n  Current Outpatient Prescriptions   Medication    hydrOXYzine HCl (ATARAX) 50 MG tablet    famotidine (PEPCID) 20 MG tablet    simvastatin (ZOCOR) 10 MG tablet    citalopram (CELEXA) 40 MG tablet    QUEtiapine (SEROQUEL) 100 MG tablet    gabapentin (NEURONTIN) 300 MG capsule    cyclobenzaprine (FLEXERIL) 5 MG tablet    meclizine (ANTIVERT) 25 MG tablet    ALPRAZolam (XANAX) 2 MG tablet     No current facility-administered medications for this visit.          Review of Symptoms:    Twelve systems have been reviewed and pertinent positives are noted in her history above. Other positives are noted on her intake sheets which were filled out prior to her appointment and reviewed.    Physical Exam:       BP 112/63    Pulse 93    Ht 1.626 m (5\' 4" )    Wt 80.7 kg (178 lb)    BMI 30.55 kg/m            General:     Well-developed and in no apparent distress.  Eyes: EOMI, sclera clear.  Cardiovascular: No cyanosis, good capillary refill. There is absence of pedal edema bilaterally.  Pulmonary: Clear, even, and easy respirations.  Skin: Warm, dry, and pink.     Musculoskeletal:     Range of motion in the neck is full range of motion and in the lower back is diminished range with pain on backward extension, lateral bending and rotation.  There is no evidence of paraspinal tenderness in the  thoracic region, lumbar region and thoracolumbar junction or cervical regions.  SLR is positive at 60 degrees on left. Spurling's sign is negative bilaterally.  There is not evidence of Waddell's signs which can sometimes be positive with somatization.      Focused Neurological Exam:      Alert and oriented to person, place and time. Speech is clear and fluent. Cranial nerves II-XII intact.      Strength bulk and tone were normal in both upper and lower extremities except for what is indicated below in the strength chart.  No pronator drift. Muscle tone is normal the upper and lower extremities bilaterally, no atrophy and no fasciculations.      Upper Extremities   Deltoid Biceps Triceps WE WF Int   Right 5 5 5 5 5 5    Left 5 5 5 5 5 5      Lower Extremities    HF KF KE DF EHL PF   Right 5 5 5 5 5 5    Left 4 5 5 4 5 5        Sensation intact to pinprick, light touch, temperature and proprioception bilaterally in upper and lower extremities at the present time.    Reflexes with DTR's 2+ bilateral biceps, 2+ bilateral triceps, 2+ bilateral brachioradialis, 2+ bilateral patellar, 2+ bilateral Achilles.  No clonus. Toes are downgoing.  Negative Hoffman's.    Cerebellar with negative romberg and no dysmetria.    Gait is wide-based, steady andNormal.       Imaging:   I personally reviewed the MRI of the brain without contrast done on 09/01/2018.  There is a left frontal lobe meningioma. There is not cerebral atrophy, no acute infarction or intracerebral hemorrhage.        Impression and Plan:    Amnah Breuer is a 57 y.o. female with a left temporal meningioma. Will  schedule follow up with Dr. Laureen Ochs to discuss treatment options.        ICD-10-CM ICD-9-CM    1. Meningioma D32.9 225.2        The visit was 30 minutes, with greater than 50% of the time spent counseling and coordinating care.    Thank you for this opportunity to care for your patient.  If you have any questions about this patient or others and you wish to speak  with me directly, please e-mail, use qliq or call our office at [phone].    Sincerely,      Vilinda Blanks. Mancel Bale, FNP-BC    [email or web address]    cc. Pat Patrick, MD             Vilinda Blanks. Mancel Bale FNP-BC    [employer]  Department of Neurosurgery  [facility]  [address]  [place], [place] [address]  [phone]

## 2018-09-17 ENCOUNTER — Ambulatory Visit: Payer: No Typology Code available for payment source | Admitting: Neurosurgery

## 2018-09-24 ENCOUNTER — Encounter: Payer: Self-pay | Admitting: Neurosurgery

## 2018-09-24 ENCOUNTER — Ambulatory Visit: Payer: No Typology Code available for payment source | Attending: Neurosurgery | Admitting: Neurosurgery

## 2018-09-24 VITALS — BP 113/66 | HR 82 | Ht 64.0 in | Wt 177.0 lb

## 2018-09-24 DIAGNOSIS — D329 Benign neoplasm of meninges, unspecified: Secondary | ICD-10-CM

## 2018-09-24 NOTE — Progress Notes (Signed)
Dear Dr. Chase Caller,    I had the pleasure of seeing Krystal Kramer today in follow up.    Interval History:     Krystal Kramer is a 57 y.o. female who presented recently to Verita Lamb, NP, with headaches of relatively recent onset.  An MRI revealed an incidental orbital roof lesion consistent with meningioma.  These headaches have been present for a few months and they have not changed in nature or character.  She has some pain over the left orbit as well.    Interval Pain Control and Interval Treatment Modalities:                   Pain    09/24/18 0757   PainSc:   4   PainLoc: Head     She takes Tylenol and Advil for headaches.    Ongoing Tobacco History/Use:    No changes    Changes in Medical and Social History including Medications and Allergies:    None    Current Reported Medications per Patient:    No changes    Review of Symptoms:    Pertinent changes, both positive and negative, are included in the above history, and are otherwise unchanged from her previous visit.      Physical Exam:   BP 113/66    Pulse 82    Ht 1.626 m (5\' 4" )    Wt 80.3 kg (177 lb)    BMI 30.38 kg/m         Height       Weight       Estimated body mass index is 30.38 kg/m as calculated from the following:    Height as of this encounter: 1.626 m (5\' 4" ).    Weight as of this encounter: 80.3 kg (177 lb).    General:     Well-developed and in no acute distress.  Eyes: EOMI, sclera clear.  Cardiovascular: No cyanosis, good capillary refill. No edema.  Pulmonary: Clear, even, and easy respirations.  Skin: Warm, dry, and pink.    Musculoskeletal:     Bulk and tone remain normal.  Range of motion in the neck is full  and in the lower back is full .      Focused Neurological Exam:      Orientation: Alert and Oriented, Cooperative with exam.    Cranial Nerves: Grossly intact.     Strength: Normal in both upper and lower extremities.    Sensation: Grossly intact to light touch.     Reflexes: Symmetrical.    Cerebellar:  Finger to nose intact  without dysmetria.    Gait: Normal both straight away and tandem.    Interval Imaging:      MRI reveals a small left lateral orbital roof lesion consistent with meningioma.  She has older CT scans and a more recent CT scan but the lesion was not clearly visible:          Impression and Plan:    Krystal Kramer is a 57 y.o. female with an incidental meningioma.  I discussed with her the options of observation, surgery and radiation.  I believe observation versus surgery is most appropriate.  I think following this for a period of time to assess its growth is very reasonable, but the patient would like to have it removed with the understanding that at some point, given her age, it would likely need to be removed if and when it shows signs of growth.  I don't  think this is unreasonable but I impressed upon her and her husband that watchful waiting would be quite reasonable as well.  She seems firmly set on having it removed and I think from a psychological standpoint, this would be very helpful for her.    She would like this to be done after the holidays, some time in January.  I will see her back in December for preoperative consent for a left orbital craniotomy and resection of meningioma.    The visit was 30 minutes, with greater than 50% of the time spent counseling and coordinating care.    Thank you for this opportunity to care for your patient.  If you have any questions about this patient or others and you wish to speak with me directly, please call my cell phone at any time:  [phone].    Sincerely,      Marice Potter, MD    cc:           Marice Potter, MD, [facility]  [address]  [place], [place] [address]  [phone]

## 2018-09-25 ENCOUNTER — Telehealth: Payer: Self-pay

## 2018-09-25 NOTE — Telephone Encounter (Signed)
The patient is calling.  She does not want surgery after the holidays.  She would like it ASAP.    [employer]

## 2018-11-19 ENCOUNTER — Ambulatory Visit: Payer: No Typology Code available for payment source | Admitting: Neurosurgery

## 2018-11-19 ENCOUNTER — Encounter: Payer: Self-pay | Admitting: Neurosurgery

## 2018-11-19 ENCOUNTER — Ambulatory Visit: Payer: No Typology Code available for payment source | Attending: Neurosurgery | Admitting: Neurosurgery

## 2018-11-19 VITALS — BP 102/64 | HR 94 | Ht 64.0 in | Wt 179.0 lb

## 2018-11-19 DIAGNOSIS — D329 Benign neoplasm of meninges, unspecified: Secondary | ICD-10-CM

## 2018-12-02 NOTE — Progress Notes (Signed)
Jolly Mango, MS, PA-C    [place] [facility] [place]  Department of Neurosurgery  [facility]  [address]  [place], [place] [address]  [phone]              HPI: Krystal Kramer is a 58 y.o. female who presents today for pre-op evaluation prior to left orbital craniotomy for resection of meningioma. She reports no new or changed symptoms since her last visit. Reports headaches are unchanged. The meningioma was incidentally found during work up for the headache.     ROS:   Review of Systems   Eyes: Negative for blurred vision.   Neurological: Positive for headaches. Negative for dizziness.       Current Medications:   Current Outpatient Medications   Medication    famotidine (PEPCID) 20 MG tablet    simvastatin (ZOCOR) 10 MG tablet    citalopram (CELEXA) 40 MG tablet    QUEtiapine (SEROQUEL) 100 MG tablet    gabapentin (NEURONTIN) 300 MG capsule    hydrOXYzine HCl (ATARAX) 50 MG tablet    cyclobenzaprine (FLEXERIL) 5 MG tablet    meclizine (ANTIVERT) 25 MG tablet    ALPRAZolam (XANAX) 2 MG tablet     No current facility-administered medications for this visit.        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    Hyperlipidemia     Low back pain     Lumbar disc disease     Panic disorder     Thyroid nodule     Thyromegaly     Tobacco abuse        Past Surgical History:   Procedure Laterality Date    bone spur      HYSTERECTOMY      OVARY REMOVAL      RECTAL SURGERY      TONSILLECTOMY AND ADENOIDECTOMY         Social History     Socioeconomic History    Marital status: Married     Spouse name: Not on file    Number of children: Not on file    Years of education: Not on  file    Highest education level: Not on file   Occupational History    Not on file   Tobacco Use    Smoking status: Current Every Day Smoker     Packs/day: 1.50     Years: 30.00     Pack years: 45.00     Types: Cigarettes    Smokeless tobacco: Never Used   Substance and Sexual Activity    Alcohol use: No    Drug use: No    Sexual activity: Not on file   Social History Narrative    Not on file         History reviewed. No pertinent family history.    Physical Exam:  BP 102/64    Pulse 94    Ht 1.626 m (5\' 4" )    Wt 81.2 kg (179 lb)    BMI 30.73 kg/m     General: Appears well, no acute distress. Slightly anxious.     Neuro:   Alert and oriented to person, place and time. Speech is clear and  fluent.      Cranial nerves II-XII intact.      Gait is normal and steady.     Strength 5/5    Sensation intact.    DTR 2+ biceps, 2+ triceps, 2+ brachioradialis, 2+ patellar, 2+ Achilles'.     Coordination intact.       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     Assessment and Plan:   This patient present for pre op evaluation prior to undergoing surgery with Dr. Laureen Ochs. She consented today for left orbital craniotomy and resection of meningioma. The procedure, peri-operative expectations, and post-operative course were discussed with the patient in office today. We discussed recovery and post-op follow up as well. She understands to call our office if she has any other questions. She will follow up post-op.

## 2018-12-15 ENCOUNTER — Encounter: Payer: Self-pay | Admitting: Gastroenterology

## 2018-12-16 ENCOUNTER — Encounter: Payer: Self-pay | Admitting: Gastroenterology

## 2018-12-28 ENCOUNTER — Other Ambulatory Visit: Payer: Self-pay | Admitting: Pain Medicine

## 2018-12-28 ENCOUNTER — Encounter: Payer: Self-pay | Admitting: Neurosurgery

## 2018-12-28 ENCOUNTER — Encounter: Payer: Self-pay | Admitting: Gastroenterology

## 2018-12-28 DIAGNOSIS — D329 Benign neoplasm of meninges, unspecified: Secondary | ICD-10-CM

## 2018-12-28 DIAGNOSIS — D320 Benign neoplasm of cerebral meninges: Secondary | ICD-10-CM

## 2018-12-28 MED ORDER — TRAMADOL HCL 50 MG PO TABS *I*
50.0000 mg | ORAL_TABLET | Freq: Four times a day (QID) | ORAL | 0 refills | Status: AC | PRN
Start: 2018-12-28 — End: ?

## 2018-12-28 MED ORDER — DOCUSATE SODIUM 100 MG PO CAPS *I*
100.0000 mg | ORAL_CAPSULE | Freq: Two times a day (BID) | ORAL | 1 refills | Status: DC
Start: 2018-12-28 — End: 2019-01-20

## 2018-12-28 MED ORDER — LEVETIRACETAM 500 MG PO TABS *I*
500.0000 mg | ORAL_TABLET | Freq: Two times a day (BID) | ORAL | 0 refills | Status: AC
Start: 2018-12-28 — End: 2019-01-04

## 2018-12-28 NOTE — Progress Notes (Signed)
12/28/18 Left frontal craniotomy for meningioma    I called in Tramadol and Colace due to multiple allergies

## 2018-12-28 NOTE — Progress Notes (Signed)
Addendum: Please note Keppra 500 mg twice daily by mouth x 1 week for s/p resection of meningioma. No need to titrate of medication per Dr. Laureen Ochs.     Krystal Kramer

## 2018-12-30 ENCOUNTER — Telehealth: Payer: Self-pay

## 2018-12-30 DIAGNOSIS — D329 Benign neoplasm of meninges, unspecified: Secondary | ICD-10-CM

## 2018-12-30 NOTE — Telephone Encounter (Signed)
Dena from [facility] Dept called and said patient is very nauseous and would like to know if you can send something to [facility] in [place] for her please.  Dena would like [place] to call her at [phone] to let her know what we send for patient.  She also needs an order for nursing for plan of care for someone to go out and look for infection.      Thanks      Anderson Malta A Elden Brucato

## 2018-12-31 MED ORDER — ONDANSETRON 4 MG PO TBDP *I*
4.0000 mg | ORAL_TABLET | Freq: Three times a day (TID) | ORAL | 0 refills | Status: AC | PRN
Start: 2018-12-31 — End: 2019-01-07

## 2018-12-31 NOTE — Telephone Encounter (Signed)
Noted.Tried to contact home health nurse. No answer, Left message. Please see order for home health services and Zofran,     Thank you,     Kathlee Nations

## 2019-01-05 ENCOUNTER — Encounter: Payer: Self-pay | Admitting: Gastroenterology

## 2019-01-05 ENCOUNTER — Telehealth: Payer: No Typology Code available for payment source | Admitting: Neurosurgery

## 2019-01-05 NOTE — Telephone Encounter (Signed)
December 28, 2018: Left orbitofrontal craniotomy for meningioma    The patient is doing very well.  She has no specific complaint except for mild intermittent headache.  She did have significant nausea and some vomiting directly after surgery while at home but this resolved with medication called in.  We will look forward to seeing her back.

## 2019-01-13 ENCOUNTER — Telehealth: Payer: Self-pay

## 2019-01-13 NOTE — Telephone Encounter (Signed)
The patient is having pain on the left side of her head/ear. The nurse would like to speak to someone about this because she doesn't feel that this is normal.  It is a new occurrence.    Mendel Ryder [facility] nurse)  [phone]

## 2019-01-13 NOTE — Telephone Encounter (Signed)
Spoke to home health nurse and she reports the patient has developed a left lateral temporal headache without any visual disturbances. She denies any fevers and she reports the pain is a throbbing headache. She reports he left cervical lymph nodes are slightly swollen. She is scheduled to follow up with our office on 01/20/2019. She reports the pain subsides at times when she lays on that side of her head. She denies any increase in headaches when upright from laying. Recommend nurse continue to monitor area and contact the office with any further concerns.

## 2019-01-14 ENCOUNTER — Telehealth: Payer: Self-pay

## 2019-01-14 NOTE — Telephone Encounter (Signed)
SX 12/28/18  AUTH [id number]

## 2019-01-15 ENCOUNTER — Telehealth: Payer: Self-pay

## 2019-01-15 DIAGNOSIS — D329 Benign neoplasm of meninges, unspecified: Secondary | ICD-10-CM

## 2019-01-15 NOTE — Telephone Encounter (Signed)
Home health nurse, Ria Comment, called to say that the throbbing and stabbing in her head isn't getting any better.  Ria Comment would like you to call her and can be reached at [phone].        Eduard Clos Tyniah Kastens

## 2019-01-15 NOTE — Telephone Encounter (Signed)
Order done.     Thanks a bunch!    Kathlee Nations

## 2019-01-15 NOTE — Telephone Encounter (Signed)
Can we please contact the Nurse and see what is going on. When is the pain the worse? When is it better? I can order a CT of her head but if she feels it is an emergency she can be seen in the ER. Let me know.

## 2019-01-20 ENCOUNTER — Encounter: Payer: Self-pay | Admitting: Neurosurgery

## 2019-01-20 ENCOUNTER — Ambulatory Visit: Payer: No Typology Code available for payment source | Attending: Neurosurgery | Admitting: Neurosurgery

## 2019-01-20 VITALS — BP 127/75 | HR 94 | Ht 64.0 in | Wt 179.0 lb

## 2019-01-20 DIAGNOSIS — Z4889 Encounter for other specified surgical aftercare: Secondary | ICD-10-CM | POA: Insufficient documentation

## 2019-01-20 NOTE — Progress Notes (Signed)
Jolly Mango, MS, PA-C    [place] [facility] [place]  Department of Neurosurgery  [place]  [place], [place] [address]  [phone]            CC: Patient presents for evaluation of headache    HPI: Krystal Kramer is a 58 y.o. female who reports developing a left temporal headache approximately 1 week ago.  She is s/p left orbital frontal craniotomy for meningioma resection on 12/28/18 with Dr. Laureen Ochs.  Headache is constant and unrelieved with over-the-counter medication.  However, she does experience some relief with ibuprofen.  She has experienced headache in the past prior to workup and surgery.  She is unable to specify location of previous headache and relate whether or not to current headache is similar.  She denies specific aggravating or alleviating factors for current headache.  Denies vision changes, seizure, nausea, radiating pain, gait instability, numbness, tingling, weakness, pain in the upper and lower extremities, lightheadedness and dizziness.    ROS:   Review of Systems   Constitutional: Negative for chills and fever.   Gastrointestinal: Negative for nausea and vomiting.   Musculoskeletal: Negative for back pain, falls and neck pain.   Neurological: Positive for headaches. Negative for dizziness, tingling, tremors, sensory change, speech change, focal weakness, seizures and weakness.   All other systems reviewed and are negative.      Current Medications:   Current Outpatient Medications   Medication    traMADol (ULTRAM) 50 MG tablet    hydrOXYzine HCl (ATARAX) 50 MG tablet    cyclobenzaprine (FLEXERIL) 5 MG tablet    famotidine (PEPCID) 20 MG tablet    meclizine (ANTIVERT) 25 MG tablet    simvastatin (ZOCOR) 10 MG tablet    citalopram (CELEXA) 40 MG tablet    ALPRAZolam (XANAX) 2 MG tablet    QUEtiapine (SEROQUEL) 100 MG tablet    gabapentin (NEURONTIN) 300 MG capsule    levETIRAcetam (KEPPRA) 500 MG tablet     No current facility-administered medications for  this visit.        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    Hyperlipidemia     Low back pain     Lumbar disc disease     Panic disorder     Thyroid nodule     Thyromegaly     Tobacco abuse        Past Surgical History:   Procedure Laterality Date    bone spur      HYSTERECTOMY      OVARY REMOVAL      RECTAL SURGERY      TONSILLECTOMY AND ADENOIDECTOMY         Social History     Socioeconomic History    Marital status: Married     Spouse name: Not on file    Number of children: Not on file    Years of education: Not on file    Highest education level: Not on file   Occupational History    Not on file   Tobacco Use    Smoking status: Current Every Day Smoker     Packs/day: 1.50     Years: 30.00  Pack years: 45.00     Types: Cigarettes    Smokeless tobacco: Never Used   Substance and Sexual Activity    Alcohol use: No    Drug use: No    Sexual activity: Not on file   Social History Narrative    Not on file         No family history on file.    Physical Exam:  BP 127/75    Pulse 94    Ht 1.626 m (5\' 4" )    Wt 81.2 kg (179 lb)    BMI 30.73 kg/m     General: Appears well, no acute distress.     Neuro:   Alert and oriented to person, place and time. Speech is clear and fluent.      Cranial nerves II-XII intact.      Sensory exam intact to light touch and pinprick.     Gait is normal and steady.     DTR 2+ biceps, 2+ triceps, 2+ brachioradialis, 2+ patellar, 2+ Achilles'. No clonus. Hoffman's negative.      No pronator drift.     Coordination intact.    Incision: Healing well. No drainage, erythema, swelling.     Imaging:   CT head shows post-operative change and is without  hemorrhage.    Assessment and Plan:   This patient presents for follow-up after left orbitofrontal craniotomy for resection of meningioma on 12/28/18 with Dr. Laureen Ochs.  She has been experiencing left temporal headache for approximately one week and underwent follow-up CT scan for evaluation.  This imaging was available and reviewed with the patient in office today.  CT shows postoperative change but is otherwise normal.  CT does not provide a clear explanation for her headache.  She is recovering well from surgery.  She will continue over-the-counter medications and follow-up with her PCP for further evaluation of headache.  Neuro intact on exam today.  She will be seen in office for another follow-up appointment in approximately 4 weeks.

## 2019-01-25 ENCOUNTER — Encounter: Payer: Self-pay | Admitting: Gastroenterology

## 2019-01-27 ENCOUNTER — Encounter: Payer: Self-pay | Admitting: Gastroenterology

## 2019-02-18 ENCOUNTER — Ambulatory Visit: Payer: No Typology Code available for payment source | Attending: Neurosurgery | Admitting: Neurosurgery

## 2019-02-18 DIAGNOSIS — D329 Benign neoplasm of meninges, unspecified: Secondary | ICD-10-CM

## 2019-02-18 DIAGNOSIS — Z48811 Encounter for surgical aftercare following surgery on the nervous system: Secondary | ICD-10-CM

## 2019-02-18 DIAGNOSIS — D320 Benign neoplasm of cerebral meninges: Secondary | ICD-10-CM

## 2019-02-18 DIAGNOSIS — Z4889 Encounter for other specified surgical aftercare: Secondary | ICD-10-CM

## 2019-02-18 NOTE — Progress Notes (Signed)
Telephone Visit     Reason for visit: Follow-up      HPI:  This patient presents to the telephone conference follow up visit with no current complaints. She underwent left orbitofrontal craniotomy for meningioma resection on 12/28/18 with Dr. Laureen Ochs.  At her last visit she was experiencing severe, constant headache.  CT had been obtained prior to that visit which showed postoperative change and no hemorrhage.  Currently she denies headache and states that she is feeling very well.  The headache improved shortly after her last appointment and she attributes it to allergies, sinus issues and stress from her mother recently having a heart attack.  Today she reports itching sensation on the scalp near the surgical wound.  She is able to apply an ointment to this area with resolution of itching.  She denies vision change, dizziness, gait instability, speech change.  Denies swelling, redness and pain at the incision site.  She states that the visiting nurse that was coming for a period of time after her surgery checked the incision frequently and now she has her husband check the incision.    Patient's problem list, allergies, and medications were not reviewed and updated as appropriate. Please see the EHR for full details.    Assessment Plan:  This patient presents for follow-up after left orbitofrontal craniotomy for meningioma resection on 12/28/18 with Dr. Laureen Ochs.  Headaches have resolved since her last visit in office and she states she is feeling quite well. We discussed obtaining MRI brain with and without contrast for follow up and monitoring after the meningioma resection. This imaging has been ordered to be completed in 1 month and she will follow up in office after the MRI.     The plan was discussed with the patient and the patient/patient rep demonstrated understanding to the provider's satisfaction.    Consent was previously obtained from the patient to complete this telephone consult; including the potential  for financial liability.    5-10 minutes was spent reviewing the EMR and management of this patient.

## 2019-03-22 ENCOUNTER — Telehealth: Payer: Self-pay

## 2019-03-22 NOTE — Telephone Encounter (Signed)
Mri of head was approved , approval number is [id number] expires 09/18/2019. Everything faxed to [facility] to schedule.  Krystal Kunde Aragon LPN

## 2019-10-21 ENCOUNTER — Encounter: Payer: Self-pay | Admitting: Gastroenterology

## 2019-11-11 ENCOUNTER — Ambulatory Visit: Payer: No Typology Code available for payment source | Admitting: Colorectal Surgery

## 2019-12-03 HISTORY — PX: BIOPSY BREAST: PRO8

## 2019-12-21 ENCOUNTER — Ambulatory Visit: Payer: No Typology Code available for payment source | Admitting: Colorectal Surgery

## 2020-01-06 ENCOUNTER — Encounter: Payer: Self-pay | Admitting: Gastroenterology

## 2020-01-11 ENCOUNTER — Ambulatory Visit: Payer: No Typology Code available for payment source | Admitting: Colorectal Surgery

## 2020-01-13 ENCOUNTER — Other Ambulatory Visit: Payer: Self-pay | Admitting: Gastroenterology

## 2020-02-10 ENCOUNTER — Encounter: Payer: Self-pay | Admitting: Gastroenterology

## 2020-02-21 ENCOUNTER — Telehealth: Payer: Self-pay

## 2020-02-21 NOTE — Telephone Encounter (Signed)
Patient confirmed appt scheduled for 3/25.

## 2020-02-24 ENCOUNTER — Ambulatory Visit: Payer: No Typology Code available for payment source | Admitting: Neurosurgery

## 2020-03-03 ENCOUNTER — Ambulatory Visit: Payer: No Typology Code available for payment source | Admitting: Neurosurgery

## 2020-03-09 ENCOUNTER — Telehealth: Payer: Self-pay | Admitting: Internal Medicine

## 2020-03-09 NOTE — Telephone Encounter (Signed)
We received a referral from Dr Golden Pop office to [facility] for LEFT ETD.   called and spoke with patient, she is unsure if she wanted to make an appt because they may be moving to [place] within the next month. She will call if she changes her mind.   Referral sent to scanning

## 2020-04-05 ENCOUNTER — Ambulatory Visit: Payer: No Typology Code available for payment source | Admitting: Neurosurgery

## 2020-04-26 ENCOUNTER — Ambulatory Visit: Payer: No Typology Code available for payment source | Admitting: Neurosurgery

## 2020-04-26 NOTE — Progress Notes (Deleted)
Dear Dr. Ramaswamy,     I had the pleasure of seeing Krystal Kramer today in follow up after left frontal craniotomy for meningioma on December 28, 2018.     Previous Impression and Plan, Krystal Lawlor, PA-c, on 3.19.2020:      Krystal Kramer is a 59 y.o. female who states her headaches have resolved since her last visit in office and she states she is feeling quite well. We discussed obtaining MRI brain with and without contrast for follow up and monitoring after the meningioma resection. This imaging has been ordered to be completed in 1 month and she will follow up in office after the MRI.      Current History:     ***     Post-operative pain Control and/or Other Treatment Modalities:                     ***     Changes in Medical and Social History:     ***     Current Reported Medications per Patient:         Current Outpatient Medications   Medication   • traMADol (ULTRAM) 50 MG tablet   • levETIRAcetam (KEPPRA) 500 MG tablet   • hydrOXYzine HCl (ATARAX) 50 MG tablet   • cyclobenzaprine (FLEXERIL) 5 MG tablet   • famotidine (PEPCID) 20 MG tablet   • meclizine (ANTIVERT) 25 MG tablet   • simvastatin (ZOCOR) 10 MG tablet   • citalopram (CELEXA) 40 MG tablet   • ALPRAZolam (XANAX) 2 MG tablet   • QUEtiapine (SEROQUEL) 100 MG tablet   • gabapentin (NEURONTIN) 300 MG capsule      No current facility-administered medications for this visit.         Review of Symptoms:     Pertinent changes, both positive and negative, are included in the above history, and are otherwise unchanged from her preoperative status.       Physical Exam:                            ***   Estimated body mass index is 30.73 kg/m² as calculated from the following:    Height as of 01/20/19: 1.626 m (5' 4").    Weight as of 01/20/19: 81.2 kg (179 lb).     General: ***     Well-developed and in no acute distress.  Eyes: EOMI, sclera clear.  Cardiovascular: No cyanosis, good capillary refill. No ***edema.  Pulmonary: Clear, even, and easy  respirations.  Skin: Warm, dry, and pink.     Musculoskeletal:     Bulk and tone remain normal.     Wound:     Clean, Dry and Intact without erythema or discharge***.     Focused Neurological Exam:      Orientation: Alert and Oriented to self, place and time and situation***.     Strength: Very good in both upper and lower extremities***.     Sensation: Intact to LT *** throughout upper and lower extremities***.      Reflexes: Equal bilaterally***.     Cerebellar: Finger to nose without dysmetria***.     Gait: Steady***.     Interval Imaging:       None***:                  Jan 13, 2020                              Preop (2019)          Impression and Plan:    Krystal Kramer is doing well since surgery.   She may continue to increase activity as she feels capable. ***      The visit was 30*** minutes, with greater than 50% of the time spent counseling and coordinating care.     Thank you for this opportunity to care for your patient.  If you have any questions about this patient or others and you wish to speak with me directly, please call my cell phone at any time:  [phone].     Sincerely,        Cristian Davitt     Kyriaki Moder A Kedra Mcglade, MD on 04/26/2020 at 5:10 PM      cc:             Konrad Hoak, MD, FAANS  [facility] [place]  [facility]  [address]  [place], [place] [address]  [phone]

## 2020-04-27 ENCOUNTER — Ambulatory Visit: Payer: No Typology Code available for payment source | Admitting: Neurosurgery

## 2020-05-13 ENCOUNTER — Ambulatory Visit: Payer: No Typology Code available for payment source | Admitting: Neurosurgery

## 2020-05-18 ENCOUNTER — Ambulatory Visit: Payer: No Typology Code available for payment source | Admitting: Neurosurgery

## 2020-05-23 ENCOUNTER — Telehealth: Payer: Self-pay

## 2020-05-23 NOTE — Telephone Encounter (Signed)
Called and spoke with patient, patient confirmed upcoming appointment.

## 2020-05-24 ENCOUNTER — Ambulatory Visit: Payer: No Typology Code available for payment source | Admitting: Neurosurgery

## 2020-05-25 NOTE — Progress Notes (Deleted)
Dear Dr. Chase Caller,    I had the pleasure of seeing Nadya Hopwood today in follow up after left frontal craniotomy for meningioma on December 28, 2018.    Previous Impression and Plan, Theodore Demark, PA-c, on 3.19.2020:     Joselynne Killam is a 59 y.o. female who states her headaches have resolved since her last visit in office and she states she is feeling quite well. We discussed obtaining MRI brain with and without contrast for follow up and monitoring after the meningioma resection. This imaging has been ordered to be completed in 1 month and she will follow up in office after the MRI.    Current History:    ***    Post-operative pain Control and/or Other Treatment Modalities:                    ***    Changes in Medical and Social History:    ***    Current Reported Medications per Patient:        Current Outpatient Medications   Medication    traMADol (ULTRAM) 50 MG tablet    levETIRAcetam (KEPPRA) 500 MG tablet    hydrOXYzine HCl (ATARAX) 50 MG tablet    cyclobenzaprine (FLEXERIL) 5 MG tablet    famotidine (PEPCID) 20 MG tablet    meclizine (ANTIVERT) 25 MG tablet    simvastatin (ZOCOR) 10 MG tablet    citalopram (CELEXA) 40 MG tablet    ALPRAZolam (XANAX) 2 MG tablet    QUEtiapine (SEROQUEL) 100 MG tablet    gabapentin (NEURONTIN) 300 MG capsule     No current facility-administered medications for this visit.       Review of Symptoms:    Pertinent changes, both positive and negative, are included in the above history, and are otherwise unchanged from her preoperative status.      Physical Exam:                           ***   Estimated body mass index is 30.73 kg/m as calculated from the following:    Height as of 01/20/19: 1.626 m (5\' 4" ).    Weight as of 01/20/19: 81.2 kg (179 lb).    General: ***    Well-developed and in no acute distress.  Eyes: EOMI, sclera clear.  Cardiovascular: No cyanosis, good capillary refill. No ***edema.  Pulmonary: Clear, even, and easy  respirations.  Skin: Warm, dry, and pink.    Musculoskeletal:    Bulk and tone remain normal.    Wound:    Clean, Dry and Intact without erythema or discharge***.    Focused Neurological Exam:      Orientation: Alert and Oriented to self, place and time and situation***.     Strength: Very good in both upper and lower extremities***.    Sensation: Intact to LT *** throughout upper and lower extremities***.     Reflexes: Equal bilaterally***.    Cerebellar: Finger to nose without dysmetria***.    Gait: Steady***.    Interval Imaging:      None***:                 Jan 13, 2020                          Preop (2019)        Impression and Plan:    Delsie Amador is doing well since surgery.  She may continue to increase activity as she feels capable. ***     The visit was 30*** minutes, with greater than 50% of the time spent counseling and coordinating care.    Thank you for this opportunity to care for your patient.  If you have any questions about this patient or others and you wish to speak with me directly, please call my cell phone at any time:  [phone].    Sincerely,      Ricard Dillon, MD on 04/26/2020 at 5:10 PM     cc:           Marice Potter, MD, [facility] [place]  [address]  [place], [place] [address]  [phone]

## 2020-05-26 ENCOUNTER — Telehealth: Payer: Self-pay

## 2020-05-26 ENCOUNTER — Ambulatory Visit: Payer: No Typology Code available for payment source | Admitting: Neurosurgery

## 2020-05-26 NOTE — Telephone Encounter (Signed)
Copied from CRM [id number]. Topic: Clinical - COVID Pre-Screen >> May 26, 2020 10:14 AM Amy Hutchinson E wrote: 1. To the best of your knowledge, have you been in close contact with anyone with a confirmed diagnosis of COVID 19?  no  If no - Proceed to next question; If yes - Schedule patient for a virtual visit  2. Have you had any one or more of the following: fever, chills, cough, shortness of breath or any flu-like symptoms?  no  If no - Proceed to next question; If yes - Schedule patient for a virtual visit  3. Have you been diagnosed with or have a previous diagnosis of COVID 19?  no  If no - Proceed to next question; If yes - Schedule patient for a virtual visit  4. I am going to go over a few other symptoms with you. Please let me know if you are experiencing any of the following: no  Ear, nose or throat discomfort  A sore throat  Headache  Muscle pain  Diarrhea  Loss of taste or smell  If no - Continue with scheduling process; If yes - Document in scheduling notes   Thank you for answering these questions. Please know we will ask you these questions or similar questions when you arrive for your appointment and again it's how we are keeping everyone safe. Also, to keep you safe, please use the provided hand sanitizer when you enter the building. Hilliard Clark, we are asking everyone in the building to wear a mask because they help [place] prevent the spread of germs.   Do you have a mask of your own, if not, we are happy to provide one for you. The last thing I want to go over with you is the no visitor guidelines. This means no one can attend the appointment with you unless you need physical assistance. I understand this may be different from your past appointments and I know this may be difficult but please know if someone is driving you we are happy to call them for you once your appointment is over.

## 2020-05-26 NOTE — Telephone Encounter (Signed)
No show letter has been sent to patient

## 2020-06-19 ENCOUNTER — Telehealth: Payer: Self-pay

## 2020-06-19 NOTE — Telephone Encounter (Signed)
Patient is calling to schedule anything after 06/23/20 her medication runs out 06/25/20.  [facility] no longer accepting new patients. [facility] unable to reschedule New Patient patient. Please advise CB- [phone]

## 2020-06-19 NOTE — Telephone Encounter (Signed)
Copied from CRM #[id number]. Topic: Appointment Scheduling - New Patient >> Jun 19, 2020  8:34 AM Soria, Christian D wrote: PT need to reschedule her appt for tomorrow 7/ 20 / insurance issues / please advise 

## 2020-06-19 NOTE — Telephone Encounter (Signed)
Pt has been rescheduled with Marcelino Duster Flinchum.

## 2020-06-19 NOTE — Progress Notes (Deleted)
New patient visit   Patient: Amy Hutchinson   DOB: [birth year]   59 y.o. Female  MRN: [id number] Visit Date: 06/20/2020  Today's healthcare provider: Trey Sailors, PA-C   No chief complaint on file.  Subjective    Amy Hutchinson is a 59 y.o. female who presents today as a new patient to establish care.  HPI  ***  No past medical history on file. *** The histories are not reviewed yet. Please review them in the "History" navigator section and refresh this SmartLink. No family status information on file.   No family history on file. Social History   Socioeconomic History  . Marital status: Married    Spouse name: Not on file  . Number of children: Not on file  . Years of education: Not on file  . Highest education level: Not on file  Occupational History  . Not on file  Tobacco Use  . Smoking status: Not on file  Substance and Sexual Activity  . Alcohol use: Not on file  . Drug use: Not on file  . Sexual activity: Not on file  Other Topics Concern  . Not on file  Social History Narrative  . Not on file   Social Determinants of Health   Financial Resource Strain:   . Difficulty of Paying Living Expenses:   Food Insecurity:   . Worried About Programme researcher, broadcasting/film/video in the Last Year:   . Barista in the Last Year:   Transportation Needs:   . Freight forwarder (Medical):   Marland Kitchen Lack of Transportation (Non-Medical):   Physical Activity:   . Days of Exercise per Week:   . Minutes of Exercise per Session:   Stress:   . Feeling of Stress :   Social Connections:   . Frequency of Communication with Friends and Family:   . Frequency of Social Gatherings with Friends and Family:   . Attends Religious Services:   . Active Member of Clubs or Organizations:   . Attends Banker Meetings:   Marland Kitchen Marital Status:    No outpatient medications prior to visit.   No facility-administered medications prior to visit.   Not on File   There is no  immunization history on file for this patient.  Health Maintenance  Topic Date Due  . Hepatitis C Screening  Never done  . HIV Screening  Never done  . TETANUS/TDAP  Never done  . PAP SMEAR-Modifier  Never done  . MAMMOGRAM  Never done  . COLONOSCOPY  Never done  . INFLUENZA VACCINE  07/02/2020    No care team member to display  Review of Systems  Constitutional: Negative.   HENT: Negative.   Eyes: Negative.   Respiratory: Negative.   Cardiovascular: Negative.   Gastrointestinal: Negative.   Endocrine: Negative.   Genitourinary: Negative.   Musculoskeletal: Negative.   Skin: Negative.   Allergic/Immunologic: Negative.   Neurological: Negative.   Hematological: Negative.   Psychiatric/Behavioral: Negative.     {Heme  Chem  Endocrine  Serology  Results Review (optional):[id number]::" "}  Objective    There were no vitals taken for this visit. Physical Exam ***  Depression Screen No flowsheet data found. No results found for any visits on 06/20/20.  Assessment & Plan     ***  No follow-ups on file.     {provider attestation***:1}   Maryella Shivers  [facility] [phone] (phone) [phone] (fax)  [facility]

## 2020-06-20 ENCOUNTER — Ambulatory Visit: Payer: Self-pay | Admitting: Physician Assistant

## 2020-06-20 NOTE — Telephone Encounter (Signed)
Advised patient of provider message below and gave her phone number for [facility], [facility]  and [facility]. KW

## 2020-06-20 NOTE — Telephone Encounter (Signed)
Thought we were not scheduling new patients currently ?

## 2020-06-20 NOTE — Telephone Encounter (Signed)
FYI. KW 

## 2020-06-20 NOTE — Telephone Encounter (Signed)
Not scheduling new patients right now per office manager.

## 2020-06-23 ENCOUNTER — Ambulatory Visit: Payer: Self-pay | Admitting: Adult Health

## 2020-06-27 ENCOUNTER — Other Ambulatory Visit: Payer: Self-pay

## 2020-06-27 ENCOUNTER — Encounter: Payer: Self-pay | Admitting: Family Medicine

## 2020-06-27 ENCOUNTER — Ambulatory Visit: Payer: Self-pay | Admitting: Family Medicine

## 2020-06-27 VITALS — BP 119/69 | HR 81 | Temp 96.9°F | Resp 16 | Ht 64.0 in | Wt 162.6 lb

## 2020-06-27 DIAGNOSIS — Z7689 Persons encountering health services in other specified circumstances: Secondary | ICD-10-CM

## 2020-06-27 DIAGNOSIS — Z8603 Personal history of neoplasm of uncertain behavior: Secondary | ICD-10-CM

## 2020-06-27 DIAGNOSIS — Z9889 Other specified postprocedural states: Secondary | ICD-10-CM

## 2020-06-27 DIAGNOSIS — F411 Generalized anxiety disorder: Secondary | ICD-10-CM | POA: Insufficient documentation

## 2020-06-27 DIAGNOSIS — E782 Mixed hyperlipidemia: Secondary | ICD-10-CM

## 2020-06-27 DIAGNOSIS — F5104 Psychophysiologic insomnia: Secondary | ICD-10-CM

## 2020-06-27 DIAGNOSIS — F3340 Major depressive disorder, recurrent, in remission, unspecified: Secondary | ICD-10-CM

## 2020-06-27 DIAGNOSIS — Z1211 Encounter for screening for malignant neoplasm of colon: Secondary | ICD-10-CM

## 2020-06-27 NOTE — Patient Instructions (Addendum)
Thank you for coming to the office today.  Phone visit tomorrow, we can discuss all refills and send in for at least 1 month, and in meantime, take a look at these locations and see what will work for you. Once you get your insurance plan we can find one in network and do a referral.  PSYCHIATRY / THERAPY-COUSENLING  [facility] Address: [address] [place], [place] [address] [email or web address] Phone: [phone]  [facility] [address] [place], [place] [address] Darden Amber P: [phone]  [facility]) Address: [address], [place], [place] [address] Hours: 8:30AM-5PM Phone: [phone]  [facility] at [facility] [address] [place], [place] [address] Phone: [phone]  Caryn Section, MD [address] [facility] [address] [place], [place] [address] Phone: [phone]   [facility] (All ages) [address] Ervin Knack [place], [id number] Phone: [phone] (Option 1) [email or web address]  ---------------------------------  [facility]) [place] [address] [place], [place] [address] Phone: [phone]  [employer], available walk-in 9am-4pm M-F [address] [place], [place] [address] Hours: 9am - 4pm (M-F, walk in available) [phone]   [facility]   Address: [address] [place], [place], [place] [address] Hours: 8AM-5PM (accepts walk in to establish) Phone: [phone]  ----------------------------------------------------------------- PSYCHOLOGY COUNSELING ONLY  Delight Ovens, LCSW [address] [place], [place] [address] Main Line: [phone]  [facility]   Address: [address] [place], [place] [address] Hours: Open today   9AM-7PM Phone: [phone]  [facility] [address], [facility]  - [facility] Address: [address], [place], [place] [address] Phone: [phone]   Please schedule a Follow-up Appointment to: Return in about 1 day (around 06/28/2020) for 1 day telephone visit for med refills.  If you have any other questions or concerns, please feel free to call the office or send a message through MyChart. You may also schedule an earlier appointment if necessary.  Additionally, you may be receiving a survey about your experience at our office within a few days to 1 week by e-mail or mail. We value your feedback.  Saralyn Pilar, DO [facility] [place]

## 2020-06-27 NOTE — Progress Notes (Signed)
Subjective:    Patient ID: Amy Hutchinson, female    DOB: 10/12/1961, 59 y.o.   MRN: [id number]  Amy Hutchinson is a 59 y.o. female presenting on 06/27/2020 for Establish Care (anxiety, depression)  Trey Paula, Husband here, due to work with [employer]. Moved from [place], down here to [place] approximately 2 weeks ago   HPI  Major Depression, chronic recurrent Generalized Anxiety Disorder - In past followed by Psychiatry over past 20 years, now has no longer returned and has been managed by PCP on current meds. Today asking about med refills. Currently doing very well on her current med regimen. - Taking Quetiapine 100mg  nightly - On Xanax for past 4-5 years. Taking most days, half tab 4 times a day every day. She has 1.5 pills left, needs refill. - Worries about her youngest daughter often. - Previous medications tried - Prozac, Paxil, Klonopin, Lexapro, Zoloft. Failed without success  HYPERLIPIDEMIA: - Last lipid panel within 1 year, no labs available. - Currently taking Simvastatin 10mg , tolerating well without side effects or myalgias  History of Meningioma resection (12/2018) S/p Left orbital frontal craniotomy  Health Maintenance: No updated screening. She reports prior Mammogram, will request outside prior PCP records.  Last Cologuard done >3 years ago, due for repeat, has done x 2, cannot drink the bowel prep for colonoscopy.  Depression screen [facility] 2/9 06/27/2020  Decreased Interest 0  Down, Depressed, Hopeless 0  PHQ - 2 Score 0  Altered sleeping 0  Tired, decreased energy 0  Change in appetite 0  Feeling bad or failure about yourself  0  Trouble concentrating 0  Moving slowly or fidgety/restless 0  Suicidal thoughts 0  PHQ-9 Score 0  Difficult doing work/chores Not difficult at all     GAD 7 : Generalized Anxiety Score 06/27/2020  Nervous, Anxious, on Edge 2  Control/stop worrying 3  Worry too much - different things 0  Trouble relaxing 0  Restless 0  Easily  annoyed or irritable 0  Afraid - awful might happen 0  Total GAD 7 Score 5  Anxiety Difficulty Not difficult at all     Past Medical History:  Diagnosis Date  . Anxiety   . Back pain   . Depression   . Hyperlipidemia    Past Surgical History:  Procedure Laterality Date  . ABDOMINAL HYSTERECTOMY    . BRAIN SURGERY    . NECK SURGERY    . TONSILECTOMY, ADENOIDECTOMY, BILATERAL MYRINGOTOMY AND TUBES     Social History   Socioeconomic History  . Marital status: Married    Spouse name: Not on file  . Number of children: Not on file  . Years of education: Not on file  . Highest education level: Not on file  Occupational History  . Not on file  Tobacco Use  . Smoking status: Current Every Day Smoker  . Smokeless tobacco: Current User  Substance and Sexual Activity  . Alcohol use: Not Currently  . Drug use: Never  . Sexual activity: Not on file  Other Topics Concern  . Not on file  Social History Narrative  . Not on file   Social Determinants of Health   Financial Resource Strain:   . Difficulty of Paying Living Expenses:   Food Insecurity:   . Worried About 06/29/2020 in the Last Year:   . 06/29/2020 in the Last Year:   Transportation Needs:   . Programme researcher, broadcasting/film/video (Medical):   Barista Lack  of Transportation (Non-Medical):   Physical Activity:   . Days of Exercise per Week:   . Minutes of Exercise per Session:   Stress:   . Feeling of Stress :   Social Connections:   . Frequency of Communication with Friends and Family:   . Frequency of Social Gatherings with Friends and Family:   . Attends Religious Services:   . Active Member of Clubs or Organizations:   . Attends Banker Meetings:   Marland Kitchen Marital Status:   Intimate Partner Violence:   . Fear of Current or Ex-Partner:   . Emotionally Abused:   Marland Kitchen Physically Abused:   . Sexually Abused:    Family History  Problem Relation Age of Onset  . Diabetes Mother   . Cancer Mother         lung  . Bipolar disorder Mother   . Anxiety disorder Mother   . Depression Mother    Current Outpatient Medications on File Prior to Visit  Medication Sig  . alprazolam (XANAX) 2 MG tablet Take 2 mg by mouth at bedtime as needed for sleep. Take 0.5 tablet by PO four times daily as needed.  . citalopram (CELEXA) 40 MG tablet Take 40 mg by mouth daily.  . QUEtiapine (SEROQUEL) 100 MG tablet Take 100 mg by mouth at bedtime.  . simvastatin (ZOCOR) 10 MG tablet Take 10 mg by mouth daily.   No current facility-administered medications on file prior to visit.    Review of Systems Per HPI unless specifically indicated above      Objective:    BP 119/69   Pulse 81   Temp (!) 96.9 F (36.1 C) (Temporal)   Resp 16   Ht 5\' 4"  (1.626 m)   Wt 162 lb 9.6 oz (73.8 kg)   SpO2 99%   BMI 27.91 kg/m   Wt Readings from Last 3 Encounters:  06/27/20 162 lb 9.6 oz (73.8 kg)    Physical Exam Vitals and nursing note reviewed.  Constitutional:      General: She is not in acute distress.    Appearance: She is well-developed. She is not diaphoretic.     Comments: Well-appearing, comfortable, cooperative  HENT:     Head: Normocephalic and atraumatic.  Eyes:     General:        Right eye: No discharge.        Left eye: No discharge.     Conjunctiva/sclera: Conjunctivae normal.  Neck:     Thyroid: No thyromegaly.  Cardiovascular:     Rate and Rhythm: Normal rate and regular rhythm.     Heart sounds: Normal heart sounds. No murmur heard.   Pulmonary:     Effort: Pulmonary effort is normal. No respiratory distress.     Breath sounds: Normal breath sounds. No wheezing or rales.  Musculoskeletal:        General: Normal range of motion.     Cervical back: Normal range of motion and neck supple.  Lymphadenopathy:     Cervical: No cervical adenopathy.  Skin:    General: Skin is warm and dry.     Findings: No erythema or rash.  Neurological:     Mental Status: She is alert and oriented to  person, place, and time.  Psychiatric:        Behavior: Behavior normal.     Comments: Well groomed, good eye contact, normal speech and thoughts       No results found for this or any  previous visit.    Assessment & Plan:   Problem List Items Addressed This Visit    Screening for colon cancer   Relevant Orders   Cologuard   Psychophysiological insomnia   Mixed hyperlipidemia   Relevant Medications   simvastatin (ZOCOR) 10 MG tablet   Major depressive disorder, recurrent, in remission (HCC)   Relevant Medications   citalopram (CELEXA) 40 MG tablet   alprazolam (XANAX) 2 MG tablet   History of resection of meningioma   GAD (generalized anxiety disorder) - Primary   Relevant Medications   citalopram (CELEXA) 40 MG tablet   alprazolam (XANAX) 2 MG tablet    Other Visit Diagnoses    Encounter to establish care with new doctor          Request outside records from [place] PCP [facility] Specialists. CareEverywhere available on chart for some.  #Colon CA Screening Ordered Cologuard.  #Anxiety Depression, recurrent Reviewed meds, and request outside records. Discussed concerns with chronic BDZ use and current med regimen. Emphasized importance that given her complex chronic mental health history, it is my recommendation as new provider to her that we seek consultation from mental health specialist and may benefit from therapy as well, may offer recommendations on med management. It is often not the primary goal to manage chronic anxiety with long term BDZ. However I am willing to work with her towards this treatment goal. - Advised no rx controlled substance on first initial appointment. We will discuss this further promptly at virtual visit likely in 1-2 days, given she is about to run out of pills, will likely arrange for 30 day rx until we can review Psychiatry resources and identify location for referral, she will need insurance coverage, waiting on her husband's job currently now. -  Will review med refills as indicated and offer additional management as indicated.  No orders of the defined types were placed in this encounter.   Follow up plan: Return in about 1 day (around 06/28/2020) for 1 day telephone visit for med refills.  Saralyn Pilar, DO [facility] 06/27/2020, 3:35 PM

## 2020-06-28 ENCOUNTER — Telehealth: Payer: Self-pay

## 2020-06-28 ENCOUNTER — Encounter: Payer: Self-pay | Admitting: Family Medicine

## 2020-06-28 ENCOUNTER — Telehealth (INDEPENDENT_AMBULATORY_CARE_PROVIDER_SITE_OTHER): Payer: Self-pay | Admitting: Family Medicine

## 2020-06-28 DIAGNOSIS — F411 Generalized anxiety disorder: Secondary | ICD-10-CM

## 2020-06-28 DIAGNOSIS — F3340 Major depressive disorder, recurrent, in remission, unspecified: Secondary | ICD-10-CM

## 2020-06-28 DIAGNOSIS — E782 Mixed hyperlipidemia: Secondary | ICD-10-CM

## 2020-06-28 DIAGNOSIS — F5104 Psychophysiologic insomnia: Secondary | ICD-10-CM

## 2020-06-28 MED ORDER — QUETIAPINE FUMARATE 100 MG PO TABS: 100.0000 mg | ORAL_TABLET | Freq: Every day | ORAL | 2 refills | Status: AC

## 2020-06-28 MED ORDER — SIMVASTATIN 10 MG PO TABS: 10.0000 mg | ORAL_TABLET | Freq: Every day | ORAL | 2 refills | Status: DC

## 2020-06-28 MED ORDER — ALPRAZOLAM 2 MG PO TABS: 1.0000 mg | ORAL_TABLET | Freq: Four times a day (QID) | ORAL | 0 refills | Status: DC | PRN

## 2020-06-28 MED ORDER — CITALOPRAM HYDROBROMIDE 40 MG PO TABS: 40.0000 mg | ORAL_TABLET | Freq: Every day | ORAL | 2 refills | Status: DC

## 2020-06-28 NOTE — Progress Notes (Signed)
Virtual Visit via Telephone The purpose of this virtual visit is to provide medical care while limiting exposure to the novel coronavirus (COVID19) for both patient and office staff.  Consent was obtained for phone visit:  Yes.   Answered questions that patient had about telehealth interaction:  Yes.   I discussed the limitations, risks, security and privacy concerns of performing an evaluation and management service by telephone. I also discussed with the patient that there may be a patient responsible charge related to this service. The patient expressed understanding and agreed to proceed.  Patient Location: Home Provider Location: [facility])  ---------------------------------------------------------------------- Chief Complaint  Patient presents with  . Depression    S: Reviewed CMA documentation. I have called patient and gathered additional HPI as follows:  Generalized Anxiety Disorder Major Depression, chronic recurrent Chronic history of anxiety disorder, can be sporadic triggers, sometimes unprovoked, has had long term issue with episodic anxiety flares and some panic. One common trigger, worries about her youngest daughter.   In past followed by Psychiatry over past 20 years, now has no longer returned and has been managed by PCP on current meds.   Today asking about med refills. Currently doing very well on her current med regimen. - Taking Quetiapine 100mg  nightly, Citalopram 40mg  daily - has meds for next 1-2 weeks. - On Xanax for past 4-5 years. Taking most days, half tab 4 times a day every day. She has roughly half pill left at this time. Needs refill.  - Previous medications tried - Prozac, Paxil, Klonopin, Lexapro, Zoloft. Failed without success  Denies active worsening depression at this time. Says her anxiety often provokes or triggers her anxiety. It is currently well controlled   Past Medical History:  Diagnosis Date  . Anxiety   .  Back pain   . Depression   . Hyperlipidemia    Social History   Tobacco Use  . Smoking status: Current Every Day Smoker    Packs/day: 0.40    Years: 16.00    Pack years: 6.40    Types: Cigarettes  . Smokeless tobacco: Never Used  Substance Use Topics  . Alcohol use: Not Currently  . Drug use: Never    Current Outpatient Medications:  .  alprazolam (XANAX) 2 MG tablet, Take 2 mg by mouth at bedtime as needed for sleep. Take 0.5 tablet by PO four times daily as needed., Disp: , Rfl:  .  citalopram (CELEXA) 40 MG tablet, Take 40 mg by mouth daily., Disp: , Rfl:  .  QUEtiapine (SEROQUEL) 100 MG tablet, Take 100 mg by mouth at bedtime., Disp: , Rfl:  .  simvastatin (ZOCOR) 10 MG tablet, Take 10 mg by mouth daily., Disp: , Rfl:   Depression screen [facility] 2/9 06/28/2020 06/27/2020  Decreased Interest 0 0  Down, Depressed, Hopeless 0 0  PHQ - 2 Score 0 0  Altered sleeping 0 0  Tired, decreased energy 0 0  Change in appetite 0 0  Feeling bad or failure about yourself  0 0  Trouble concentrating 0 0  Moving slowly or fidgety/restless 0 0  Suicidal thoughts 0 0  PHQ-9 Score 0 0  Difficult doing work/chores Not difficult at all Not difficult at all    GAD 7 : Generalized Anxiety Score 06/28/2020 06/27/2020  Nervous, Anxious, on Edge 2 2  Control/stop worrying 3 3  Worry too much - different things 0 0  Trouble relaxing 0 0  Restless 0 0  Easily annoyed  or irritable 0 0  Afraid - awful might happen 0 0  Total GAD 7 Score 5 5  Anxiety Difficulty Not difficult at all Not difficult at all    -------------------------------------------------------------------------- O: No physical exam performed due to remote telephone encounter.  Lab results reviewed.  No results found for this or any previous visit (from the past 2160 hour(s)).  -------------------------------------------------------------------------- A&P:  Problem List Items Addressed This Visit    Psychophysiological  insomnia   Relevant Medications   QUEtiapine (SEROQUEL) 100 MG tablet   alprazolam (XANAX) 2 MG tablet   Mixed hyperlipidemia   Relevant Medications   simvastatin (ZOCOR) 10 MG tablet   Major depressive disorder, recurrent, in remission (HCC)   Relevant Medications   QUEtiapine (SEROQUEL) 100 MG tablet   citalopram (CELEXA) 40 MG tablet   alprazolam (XANAX) 2 MG tablet   GAD (generalized anxiety disorder) - Primary   Relevant Medications   citalopram (CELEXA) 40 MG tablet   alprazolam (XANAX) 2 MG tablet     #Anxiety Depression, recurrent Reviewed meds, and waiting on outside records. Some available in chart from CareEverywhere at this time. However awaiting PCP records who was managing her alprazolam and mental healthmeds.  Reviewed PDMP for [place] and [place] through website for past 2 years. Reviewed previous rx by her [place] providers, last on 05/27/20 for 60 pills, consistent with her current usage back >1 year.  Discussed concerns with chronic BDZ use and current med regimen. She has been on medication for past 4-5 years. No history of evidence of withdrawal symptoms.  Emphasized importance that given her complex chronic mental health history, it is my recommendation as new provider to her that we seek consultation from mental health specialist and may benefit from therapy as well, may offer recommendations on med management. It is often not the primary goal to manage chronic anxiety with long term BDZ. However I am willing to work with her towards this treatment goal.  I will agree to refill her Alprazolam current dose for 30 days, sent rx Alprazolam 2mg  for half tab dose 1mg  up to 4 times day, some days only needs 2 other days needs 4 doses., sent #60 pills, 0 refills. - Asked her to call within 1 week of needing refill and we can submit new one, monthly for now. - Anticipate once she establishes with Psychiatry, they may be able to manage that rx instead.  Additionally refilled her  other mental health meds - Quetiapine 100mg  nightly for insomnia, #30 pills +2 refill and Citalopram 40mg  daily #30 + 2 refills, sent to pharmacy she can pick up now or hold until ready.   She was asked to review previous handout yesterday with Psychiatry offices for further consultation. They can check with insurance plan and find a provider in network, once she has active insurance and located provider, we can place referral.   No orders of the defined types were placed in this encounter.   Follow-up: - Return in 3 months for anxiety, med follow-up  Patient verbalizes understanding with the above medical recommendations including the limitation of remote medical advice.  Specific follow-up and call-back criteria were given for patient to follow-up or seek medical care more urgently if needed.   - Time spent in direct consultation with patient on phone: 10 minutes   , DO [facility] 06/28/2020, 1:18 PM

## 2020-06-28 NOTE — Telephone Encounter (Signed)
Copied from CRM [id number]. Topic: Appointment Scheduling - New Patient >> Jun 19, 2020  8:34 AM Lyn Hollingshead D wrote: PT need to reschedule her appt for tomorrow 7/ 20 / insurance issues / please advise

## 2020-07-07 ENCOUNTER — Telehealth: Payer: Self-pay

## 2020-07-07 DIAGNOSIS — F411 Generalized anxiety disorder: Secondary | ICD-10-CM

## 2020-07-07 DIAGNOSIS — F3340 Major depressive disorder, recurrent, in remission, unspecified: Secondary | ICD-10-CM

## 2020-07-07 DIAGNOSIS — F5104 Psychophysiologic insomnia: Secondary | ICD-10-CM

## 2020-07-07 NOTE — Telephone Encounter (Signed)
Copied from CRM [id number]. Topic: General - Inquiry >> Jul 07, 2020 12:46 PM Daphine Deutscher D wrote: Reason for CRM: Pt called saying she got her insurance card in the mail today and she has BCBS.  Dr, K wanted to refer her to a psy doctor for eval and meds.  She said she was supposed to cal dr. Kirtland Bouchard back when she received her insurance card.  CB#  [phone]

## 2020-07-07 NOTE — Addendum Note (Signed)
Addended by: Smitty Cords on: 07/07/2020 05:23 PM   Modules accepted: Orders

## 2020-07-07 NOTE — Telephone Encounter (Signed)
Referral sent.  Reason for Referral: Referral to Psychiatry for new patient, for medication management and cognitive behavioral therapy for Major Depression recurrent and Generalized Anxiety Disorder, prior history of Psychiatry within past 20 years, in [place], now had been on medications from prior PCP, newly establish here in [place] now. Managed on Quetiapine and Xanax for past 5+ years, has failed Prozac, Paxil, Lexapro, Zoloft, Klonopin in past. Requesting assistance with med management.  Has the referral been discussed with the patient?: Yes  Designated contact for the referral if not the patient (name/phone number): patient [phone] (M)   Has the patient seen a specialist for this issue before?: Yes  If so, who (practice/provider)? Unsure >10-20+ years ago in [place], do not have records available.  Does the patient have a provider or location preference for the referral?: Yes - [facility] Would the patient like to see previous specialist if applicable?  [facility] Address: [address], [place], [place] [address] [email or web address] Phone: [phone]   Saralyn Pilar, DO [facility] 07/07/2020, 5:23 PM

## 2020-07-07 NOTE — Telephone Encounter (Signed)
They have found two different order one from specialist may be Dr Marchelle Gearing from 10/2019 which is still active and now one from Dr Kirtland Bouchard --advised to proceed with Dr Charm Rings order and patient is aware since she saw Dr Kirtland Bouchard recently, they will cancel previous order and proceed with recent order.

## 2020-07-07 NOTE — Telephone Encounter (Signed)
Excellent. Thanks.  Amy Pilar, DO [facility] 07/07/2020, 12:37 PM

## 2020-07-07 NOTE — Telephone Encounter (Signed)
Copied from CRM [id number]. Topic: General - Other >> Jul 06, 2020  4:01 PM Daphine Deutscher D wrote: Reason for CRM: Dylan with [employer] is calling in regards to a question that they have about the order for her cologuard  CB#  [phone]

## 2020-07-21 ENCOUNTER — Telehealth: Payer: Self-pay

## 2020-07-21 NOTE — Telephone Encounter (Signed)
Copied from CRM [id number]. Topic: Referral - Request for Referral >> Jul 21, 2020 10:41 AM Adrian Prince D wrote: Has patient seen PCP for this complaint? No. *If NO, is insurance requiring patient see PCP for this issue before PCP can refer them? Referral for which specialty: Orthopedic doctor for foot  Preferred provider/office: anyone you refer will be okay with the parient Reason for referral: Foot  Appt scheduled for Friday, August 27th at 2:40pm.

## 2020-07-26 ENCOUNTER — Other Ambulatory Visit: Payer: Self-pay | Admitting: Family Medicine

## 2020-07-26 DIAGNOSIS — F411 Generalized anxiety disorder: Secondary | ICD-10-CM

## 2020-07-26 DIAGNOSIS — F5104 Psychophysiologic insomnia: Secondary | ICD-10-CM

## 2020-07-26 MED ORDER — ALPRAZOLAM 2 MG PO TABS: 1.0000 mg | ORAL_TABLET | Freq: Four times a day (QID) | ORAL | 0 refills | Status: DC | PRN

## 2020-07-26 NOTE — Telephone Encounter (Signed)
Medication Refill - Medication: alprazolam Prudy Feeler) 2 MG tablet    Preferred Pharmacy (with phone number or street name):  [facility] [place], [place] - [address] Phone:  [phone]  Fax:  [phone]       Agent: Please be advised that RX refills may take up to 3 business days. We ask that you follow-up with your pharmacy.

## 2020-07-26 NOTE — Telephone Encounter (Signed)
Please review for refill. Refill not delegated.  Last refill:06/28/20 #60

## 2020-07-28 ENCOUNTER — Ambulatory Visit (INDEPENDENT_AMBULATORY_CARE_PROVIDER_SITE_OTHER): Payer: BC Managed Care – PPO | Admitting: Family Medicine

## 2020-07-28 ENCOUNTER — Encounter: Payer: Self-pay | Admitting: Family Medicine

## 2020-07-28 ENCOUNTER — Other Ambulatory Visit: Payer: Self-pay

## 2020-07-28 VITALS — BP 106/62 | HR 79 | Temp 97.3°F | Resp 16 | Ht 64.0 in | Wt 158.0 lb

## 2020-07-28 DIAGNOSIS — S92492A Other fracture of left great toe, initial encounter for closed fracture: Secondary | ICD-10-CM | POA: Diagnosis not present

## 2020-07-28 DIAGNOSIS — M79675 Pain in left toe(s): Secondary | ICD-10-CM | POA: Diagnosis not present

## 2020-07-28 NOTE — Patient Instructions (Addendum)
Thank you for coming to the office today.  Referral to Orthopedic for Left foot/toe fracture  [facility] ([facility] Address: [address] [place], [place], [place] [address] Hours:  9AM-5PM Phone: [phone]  May be able to do walk in Urgent Care hours at [facility] if you cannot wait til appointment they can help sooner. Some of the foot specialists are in [place] if they need to send you out to a specialized location. But the orthopedics in [place] should be able to handle this.  Please schedule a Follow-up Appointment to: Return in about 4 weeks (around 08/25/2020), or if symptoms worsen or fail to improve, for left foot/toe fracture.  If you have any other questions or concerns, please feel free to call the office or send a message through MyChart. You may also schedule an earlier appointment if necessary.  Additionally, you may be receiving a survey about your experience at our office within a few days to 1 week by e-mail or mail. We value your feedback.  Saralyn Pilar, DO [facility], [place]

## 2020-07-28 NOTE — Progress Notes (Signed)
Subjective:    Patient ID: Amy Hutchinson, female    DOB: October 29, 1961, 59 y.o.   MRN: [id number]  Amy Hutchinson is a 59 y.o. female presenting on 07/28/2020 for Toe Pain (left side broke her toe six and half week ago )   HPI   LEFT Great Toe Fracture / Pain Swelling Initial injury late June 2021 while living in [place] still, accidental Left Great toe fracture, she saw Orthopedic in [place] at that time about 1 week before moving to [place], they did X-rays showed fractured great toe and reported an angular fracture and was placed in CAM walker boot at that time, at least 4 weeks, then she kept boot on longer due to persistent pain bruising and swelling. - Now here today, moved to [place] about 1 month or more ago, she has been dealing with this fracture but not healing well. She is not wearing the boot now, she has difficulty with pain and swelling and bruising and limiting ability to walk on it. Denies any new injury fall or trauma  Additional update Upcoming 08/05/20 with Psychiatry, from prior referral   Depression screen [facility] 2/9 07/28/2020 06/28/2020 06/27/2020  Decreased Interest 0 0 0  Down, Depressed, Hopeless 0 0 0  PHQ - 2 Score 0 0 0  Altered sleeping 0 0 0  Tired, decreased energy 0 0 0  Change in appetite 0 0 0  Feeling bad or failure about yourself  0 0 0  Trouble concentrating 0 0 0  Moving slowly or fidgety/restless 0 0 0  Suicidal thoughts 0 0 0  PHQ-9 Score 0 0 0  Difficult doing work/chores Not difficult at all Not difficult at all Not difficult at all   GAD 7 : Generalized Anxiety Score 07/28/2020 06/28/2020 06/27/2020  Nervous, Anxious, on Edge 1 2 2   Control/stop worrying 1 3 3   Worry too much - different things 0 0 0  Trouble relaxing 0 0 0  Restless 0 0 0  Easily annoyed or irritable 0 0 0  Afraid - awful might happen 0 0 0  Total GAD 7 Score 2 5 5   Anxiety Difficulty Somewhat difficult Not difficult at all Not difficult at all     Social History   Tobacco Use  . Smoking  status: Current Every Day Smoker    Packs/day: 0.40    Years: 16.00    Pack years: 6.40    Types: Cigarettes  . Smokeless tobacco: Never Used  Substance Use Topics  . Alcohol use: Not Currently  . Drug use: Never    Review of Systems Per HPI unless specifically indicated above     Objective:    BP 106/62   Pulse 79   Temp (!) 97.3 F (36.3 C) (Temporal)   Resp 16   Ht 5\' 4"  (1.626 m)   Wt 158 lb (71.7 kg)   SpO2 98%   BMI 27.12 kg/m   Wt Readings from Last 3 Encounters:  07/28/20 158 lb (71.7 kg)  06/27/20 162 lb 9.6 oz (73.8 kg)    Physical Exam Vitals and nursing note reviewed.  Constitutional:      General: She is not in acute distress.    Appearance: She is well-developed. She is not diaphoretic.     Comments: Well-appearing, comfortable, cooperative  HENT:     Head: Normocephalic and atraumatic.  Eyes:     General:        Right eye: No discharge.  Left eye: No discharge.     Conjunctiva/sclera: Conjunctivae normal.  Cardiovascular:     Rate and Rhythm: Normal rate.  Pulmonary:     Effort: Pulmonary effort is normal.  Musculoskeletal:        General: Swelling and tenderness (Left foot, great toe and forefoot) present.     Comments: Left foot Appears some mild ecchymosis still and mild swelling assoc L Great toe and forefoot, limited movement of L great toe without pain, unable to directly bare weight on it, distal pulse intact  Skin:    General: Skin is warm and dry.     Findings: No erythema or rash.  Neurological:     Mental Status: She is alert and oriented to person, place, and time.  Psychiatric:        Behavior: Behavior normal.     Comments: Well groomed, good eye contact, normal speech and thoughts       No results found for this or any previous visit.    Assessment & Plan:   Problem List Items Addressed This Visit    None    Visit Diagnoses    Great toe pain, left    -  Primary   Other fracture of left great toe, initial  encounter for closed fracture       Relevant Orders   Ambulatory referral to Orthopedic Surgery      Clinically with persistent L great toe pain and swelling following known fracture >2 months ago No copy of X-ray result or report from [place] ([facility]) but reported angular fracture No longer using CAM Walker boot but has persistent symptoms of fracture History suggestive that it is not healed or other complication  Plan Referral to local [facility] for re-evaluation by ortho, will need x-rays by ortho and further management Discussed conservative management of pain and RICE therapy, use walker boot, off load for now  No orders of the defined types were placed in this encounter.  Orders Placed This Encounter  Procedures  . Ambulatory referral to Orthopedic Surgery    Referral Priority:   Routine    Referral Type:   Surgical    Referral Reason:   Specialty Services Required    Requested Specialty:   Orthopedic Surgery    Number of Visits Requested:   1      Follow up plan: Return in about 4 weeks (around 08/25/2020), or if symptoms worsen or fail to improve, for left foot/toe fracture.    Saralyn Pilar, DO [facility] 07/28/2020, 2:55 PM

## 2020-08-01 DIAGNOSIS — S92414A Nondisplaced fracture of proximal phalanx of right great toe, initial encounter for closed fracture: Secondary | ICD-10-CM | POA: Diagnosis not present

## 2020-08-10 ENCOUNTER — Ambulatory Visit: Payer: Self-pay

## 2020-08-10 NOTE — Telephone Encounter (Signed)
Pt. Has had dizziness x 4 days. States she has had this before "and it is usually my ears." Has some nausea with this. No availability in the office until Monday. Pt. Will go to UC.  Reason for Disposition  [1] MODERATE dizziness (e.g., interferes with normal activities) AND [2] has been evaluated by physician for this  Answer Assessment - Initial Assessment Questions 1. DESCRIPTION: "Describe your dizziness."     Dizzy 2. LIGHTHEADED: "Do you feel lightheaded?" (e.g., somewhat faint, woozy, weak upon standing)     Woozy 3. VERTIGO: "Do you feel like either you or the room is spinning or tilting?" (i.e. vertigo)     Yes 4. SEVERITY: "How bad is it?"  "Do you feel like you are going to faint?" "Can you stand and walk?"   - MILD: Feels slightly dizzy, but walking normally.   - MODERATE: Feels very unsteady when walking, but not falling; interferes with normal activities (e.g., school, work) .   - SEVERE: Unable to walk without falling, or requires assistance to walk without falling; feels like passing out now.      Moderate 5. ONSET:  "When did the dizziness begin?"     4 days 6. AGGRAVATING FACTORS: "Does anything make it worse?" (e.g., standing, change in head position)     Movement 7. HEART RATE: "Can you tell me your heart rate?" "How many beats in 15 seconds?"  (Note: not all patients can do this)       No 8. CAUSE: "What do you think is causing the dizziness?"     Ears 9. RECURRENT SYMPTOM: "Have you had dizziness before?" If Yes, ask: "When was the last time?" "What happened that time?"     Yes 10. OTHER SYMPTOMS: "Do you have any other symptoms?" (e.g., fever, chest pain, vomiting, diarrhea, bleeding)        Nausea 11. PREGNANCY: "Is there any chance you are pregnant?" "When was your last menstrual period?"       No  Protocols used: DIZZ[facility]

## 2020-08-16 DIAGNOSIS — S92499S Other fracture of unspecified great toe, sequela: Secondary | ICD-10-CM | POA: Diagnosis not present

## 2020-08-16 DIAGNOSIS — F3340 Major depressive disorder, recurrent, in remission, unspecified: Secondary | ICD-10-CM | POA: Diagnosis not present

## 2020-08-16 DIAGNOSIS — S92414A Nondisplaced fracture of proximal phalanx of right great toe, initial encounter for closed fracture: Secondary | ICD-10-CM | POA: Diagnosis not present

## 2020-08-16 DIAGNOSIS — F411 Generalized anxiety disorder: Secondary | ICD-10-CM | POA: Diagnosis not present

## 2020-08-16 DIAGNOSIS — G9050 Complex regional pain syndrome I, unspecified: Secondary | ICD-10-CM | POA: Diagnosis not present

## 2020-08-16 DIAGNOSIS — F410 Panic disorder [episodic paroxysmal anxiety] without agoraphobia: Secondary | ICD-10-CM | POA: Diagnosis not present

## 2020-08-21 DIAGNOSIS — G9050 Complex regional pain syndrome I, unspecified: Secondary | ICD-10-CM | POA: Diagnosis not present

## 2020-08-21 DIAGNOSIS — S92411S Displaced fracture of proximal phalanx of right great toe, sequela: Secondary | ICD-10-CM | POA: Diagnosis not present

## 2020-08-28 ENCOUNTER — Other Ambulatory Visit: Payer: Self-pay | Admitting: Family Medicine

## 2020-08-28 DIAGNOSIS — F5104 Psychophysiologic insomnia: Secondary | ICD-10-CM

## 2020-08-28 DIAGNOSIS — F411 Generalized anxiety disorder: Secondary | ICD-10-CM

## 2020-08-28 NOTE — Telephone Encounter (Signed)
Requested medication (s) are due for refill today  Yes  Requested medication (s) are on the active medication list   Yes  Future visit scheduled Yes 09/27/20  Note to clinic-This medication is not delegated for the [facility] to refill.  Requested Prescriptions  Pending Prescriptions Disp Refills   alprazolam (XANAX) 2 MG tablet 60 tablet 0    Sig: Take 0.5 tablets (1 mg total) by mouth 4 (four) times daily as needed for sleep or anxiety.      Not Delegated - Psychiatry:  Anxiolytics/Hypnotics Failed - 08/28/2020  3:52 PM      Failed - This refill cannot be delegated      Failed - Urine Drug Screen completed in last 360 days.      Passed - Valid encounter within last 6 months    Recent Outpatient Visits           1 month ago Great toe pain, left   [facility] [place], Netta Neat, DO   2 months ago GAD (generalized anxiety disorder)   [facility] Smitty Cords, DO   2 months ago GAD (generalized anxiety disorder)   [facility] Smitty Cords, DO       Future Appointments             In 1 month Althea Charon, Netta Neat, DO [facility]

## 2020-08-28 NOTE — Telephone Encounter (Signed)
Medication Refill - Medication: xanax  Has the patient contacted their pharmacy? No. (Agent: If no, request that the patient contact the pharmacy for the refill.) (Agent: If yes, when and what did the pharmacy advise?)  Preferred Pharmacy (with phone number or street name): [id number] - [place], [place] - [address]  Agent: Please be advised that RX refills may take up to 3 business days. We ask that you follow-up with your pharmacy.

## 2020-08-30 ENCOUNTER — Other Ambulatory Visit: Payer: Self-pay | Admitting: Family Medicine

## 2020-08-30 DIAGNOSIS — F411 Generalized anxiety disorder: Secondary | ICD-10-CM

## 2020-08-30 DIAGNOSIS — F5104 Psychophysiologic insomnia: Secondary | ICD-10-CM

## 2020-08-30 NOTE — Telephone Encounter (Signed)
Requested medication (s) are due for refill today: yes  Requested medication (s) are on the active medication list: yes  Last refill:  07/26/20  #60  0 refills  Future visit scheduled: yes  Notes to clinic:  Not delegated Needs drug screen    Requested Prescriptions  Pending Prescriptions Disp Refills   alprazolam (XANAX) 2 MG tablet [Pharmacy Med Name: ALPRAZolam 2 MG Oral Tablet] 60 tablet 0    Sig: TAKE 1/2 (ONE-HALF) TABLET BY MOUTH 4 TIMES DAILY AS NEEDED FOR ANXIETY OR  SLEEP      Not Delegated - Psychiatry:  Anxiolytics/Hypnotics Failed - 08/30/2020  9:12 AM      Failed - This refill cannot be delegated      Failed - Urine Drug Screen completed in last 360 days.      Passed - Valid encounter within last 6 months    Recent Outpatient Visits           1 month ago Great toe pain, left   [facility] [place], Netta Neat, DO   2 months ago GAD (generalized anxiety disorder)   [facility] Smitty Cords, DO   2 months ago GAD (generalized anxiety disorder)   [facility] Althea Charon, Netta Neat, DO       Future Appointments             In 4 weeks Althea Charon, Netta Neat, DO [facility]

## 2020-08-30 NOTE — Telephone Encounter (Signed)
Patient was referred to Psychiatry ([facility]) on 07/07/20.  Patient is aware that I plan to rx this medication Alprazolam temporarily until she can establish with Psychiatry.  Please call patient to find out if she has seen Psychiatry yet and when she is scheduled.  Saralyn Pilar, DO [facility] 08/30/2020, 7:54 AM

## 2020-08-31 ENCOUNTER — Telehealth: Payer: Self-pay | Admitting: Family Medicine

## 2020-08-31 MED ORDER — ALPRAZOLAM 2 MG PO TABS: 1.0000 mg | ORAL_TABLET | Freq: Four times a day (QID) | ORAL | 0 refills | Status: AC | PRN

## 2020-08-31 NOTE — Telephone Encounter (Signed)
Called [facility]. Confirmed patient saw Raynelle Fanning provider on 08/16/20. She added Buspar BID. She continued other meds and said okay to continue Xanax 1mg  PRN.  Unable to reach the provider, they reviewed office visit note for me, and I gave them message for provider, with my mobile # to contact me so we can collaborate.  I agree to refill short term for now until I speak with them and review the office notes from Psych  She is on Xanax 2mg  tabs takes HALF tab 4 times day PRN, sent rx #60, 0 refill.  I sent rx, patient was notified.  , DO [facility] 08/31/2020, 4:58 PM

## 2020-08-31 NOTE — Telephone Encounter (Signed)
FYI - Patient's Psychiatrist Raynelle Fanning, NP called me back today 9/30 after 5pm, we discussed the patient's case and current medications.  We agreed that the patient wishes to stay on Xanax at this time, and she is also on the newly rx Buspar by psychiatry.  She will fax [place] office note record tomorrow.  I have already re ordered the Xanax 2mg  tabs, take half tab for dose 1mg  - 4 times a day PRN anxiety. #60 pills. 0 refill.  will now plan to take over the Xanax rx at her next visit on 09/13/20. She will have that discussion with patient in her office.  Right now patient has enough medication to last until that apt with the new rx that was sent today.  Will defer future refill request for Xanax to her Psychiatry office - [facility], [place].  09/15/20, DO [facility] 08/31/2020, 5:56 PM

## 2020-08-31 NOTE — Telephone Encounter (Signed)
Spoke to the patient out her meds from past three days --had seen Raynelle Fanning on 07/16/2020 --patient states, "she tried couple of med's for her but did not work and as per Raynelle Fanning she has no problem Rx it but wanted to find out if Dr Kirtland Bouchard is willing to Rx this ?"

## 2020-09-08 ENCOUNTER — Telehealth: Payer: Self-pay

## 2020-09-08 NOTE — Telephone Encounter (Signed)
Copied from CRM [id number]. Topic: General - Other >> Sep 08, 2020 11:51 AM Lyn Hollingshead D wrote: PT asking for advise on the Pfizer booster shot / please advise

## 2020-09-08 NOTE — Telephone Encounter (Signed)
Would encourage booster when eligible.

## 2020-09-11 NOTE — Telephone Encounter (Signed)
Patient notified

## 2020-09-13 DIAGNOSIS — F410 Panic disorder [episodic paroxysmal anxiety] without agoraphobia: Secondary | ICD-10-CM | POA: Diagnosis not present

## 2020-09-13 DIAGNOSIS — F3340 Major depressive disorder, recurrent, in remission, unspecified: Secondary | ICD-10-CM | POA: Diagnosis not present

## 2020-09-13 DIAGNOSIS — F411 Generalized anxiety disorder: Secondary | ICD-10-CM | POA: Diagnosis not present

## 2020-09-27 ENCOUNTER — Telehealth (INDEPENDENT_AMBULATORY_CARE_PROVIDER_SITE_OTHER): Payer: BC Managed Care – PPO | Admitting: Family Medicine

## 2020-09-27 ENCOUNTER — Other Ambulatory Visit: Payer: Self-pay

## 2020-09-27 ENCOUNTER — Encounter: Payer: Self-pay | Admitting: Family Medicine

## 2020-09-27 DIAGNOSIS — F411 Generalized anxiety disorder: Secondary | ICD-10-CM

## 2020-09-27 DIAGNOSIS — J069 Acute upper respiratory infection, unspecified: Secondary | ICD-10-CM

## 2020-09-27 MED ORDER — IPRATROPIUM BROMIDE 0.06 % NA SOLN: 2.0000 | Freq: Four times a day (QID) | NASAL | 0 refills | Status: DC

## 2020-09-27 MED ORDER — BENZONATATE 100 MG PO CAPS: 100.0000 mg | ORAL_CAPSULE | Freq: Three times a day (TID) | ORAL | 0 refills | Status: DC | PRN

## 2020-09-27 NOTE — Progress Notes (Signed)
Subjective:    Patient ID: Amy Hutchinson, female    DOB: 05-01-1961, 59 y.o.   MRN: [id number]  Amy Hutchinson is a 59 y.o. female presenting on 09/27/2020 for Anxiety  Virtual / Telehealth Encounter - Telephone The purpose of this virtual visit is to provide medical care while limiting exposure to the novel coronavirus (COVID19) for both patient and office staff.  Consent was obtained for remote visit:  Yes.   Answered questions that patient had about telehealth interaction:  Yes.   I discussed the limitations, risks, security and privacy concerns of performing an evaluation and management service by video/telephone. I also discussed with the patient that there may be a patient responsible charge related to this service. The patient expressed understanding and agreed to proceed.  Patient Location: Home Provider Location: [facility] (Office)   HPI   Generalized Anxiety Disorder Major Depression, chronic recurrent Chronic history of anxiety disorder, can be sporadic triggers, sometimes unprovoked, has had long term issue with episodic anxiety flares and some panic. One common trigger, worries about her youngest daughter.  In past followed by Psychiatry over past 20 years Interval update she has established with [facility], I have corresponded with them by phone within past 1 month regarding patient coordination. Update she was changed to Alprazolam 1mg  (half of 2mg  tab) PRN dosing, #30 pills Currently doing very well on her current med regimen. - Taking Quetiapine 100mg  nightly, Citalopram 40mg  daily - On Xanax for past 4-5 years. Taking most days few times a day in past. Up to 4 - Previous medications tried - Prozac, Paxil, Klonopin, Lexapro, Zoloft.Failed without success Says her anxiety often provokes or triggers her depression. It is currently well controlled Denies active worsening depression at this time.  Viral URI / Cough / Congestion / Body  Ache Reports variety of symptoms - including body aches, congestion, cold and hot flashes, cough, congestion - She had to go to twice, and husband's mother had COVID and was not doing well. Recently returned to [facility] after traveling. - She has not been tested for COVID recently since onset symptoms. - Denies nausea vomiting, loss of taste smell, headache   Health Maintenance: UTD COVID Vaccine 04/26/20 Did not receive Flu Shot.   Depression screen [facility] 2/9 09/27/2020 07/28/2020 06/28/2020  Decreased Interest 0 0 0  Down, Depressed, Hopeless 0 0 0  PHQ - 2 Score 0 0 0  Altered sleeping 0 0 0  Tired, decreased energy 0 0 0  Change in appetite 0 0 0  Feeling bad or failure about yourself  0 0 0  Trouble concentrating 0 0 0  Moving slowly or fidgety/restless 0 0 0  Suicidal thoughts 0 0 0  PHQ-9 Score 0 0 0  Difficult doing work/chores Not difficult at all Not difficult at all Not difficult at all    Social History   Tobacco Use  . Smoking status: Current Every Day Smoker    Packs/day: 0.40    Years: 16.00    Pack years: 6.40    Types: Cigarettes  . Smokeless tobacco: Never Used  Substance Use Topics  . Alcohol use: Not Currently  . Drug use: Never    Review of Systems Per HPI unless specifically indicated above     Objective:    There were no vitals taken for this visit.  Wt Readings from Last 3 Encounters:  07/28/20 158 lb (71.7 kg)  06/27/20 162 lb 9.6 oz (73.8 kg)  Physical Exam   Telephone virtual visit. No exam done.  No results found for this or any previous visit.    Assessment & Plan:   Problem List Items Addressed This Visit    GAD (generalized anxiety disorder)   Relevant Medications   alprazolam (NIRAVAM) 2 MG dissolvable tablet    Other Visit Diagnoses    Viral URI with cough    -  Primary   Relevant Medications   benzonatate (TESSALON) 100 MG capsule   ipratropium (ATROVENT) 0.06 % nasal spray      #GAD Chronic problem Followed now by  [facility] Managing on meds and Xanax at this time, they are adjusting dose according to her usage Continue to follow-up with Psych for her mental health including anxiety and rx management. Last I corresponded w/ Psychiatry they will manage the medications including BDZ at this time.  #Viral URI Cannot rule out covid Patient has been vaccinated 5 months ago in 04/2020 already completed series, but had recent exposure and travel to [place] multiple times.  Offered COVID testing. And recommend quarantine protocol  Start Tessalon Perls take 1 capsule up to 3 times a day as needed for cough Start Atrovent nasal spray decongestant 2 sprays in each nostril up to 4 times daily for 7 days May take Tylenol PRN. Mucinex OTC Avoid oral decongestant Use nasal saline   F/u if positive covid or other symptoms worsening.  Meds ordered this encounter  Medications  . benzonatate (TESSALON) 100 MG capsule    Sig: Take 1 capsule (100 mg total) by mouth 3 (three) times daily as needed for cough.    Dispense:  30 capsule    Refill:  0  . ipratropium (ATROVENT) 0.06 % nasal spray    Sig: Place 2 sprays into both nostrils 4 (four) times daily. For up to 5-7 days then stop.    Dispense:  15 mL    Refill:  0     Follow up plan: Return in about 3 months (around 12/28/2020) for 3 month annual physical fasting lab AM AFTER apt.  Labs to be ordered at time of next visit.  Patient verbalizes understanding with the above medical recommendations including the limitation of remote medical advice.  Specific follow-up and call-back criteria were given for patient to follow-up or seek medical care more urgently if needed.  Total duration of direct patient care provided via telephone: 11 minutes   Saralyn Pilar, DO [facility] 09/27/2020, 3:49 PM

## 2020-09-27 NOTE — Patient Instructions (Addendum)
°  Please schedule a Follow-up Appointment to: Return in about 3 months (around 12/28/2020) for 3 month annual physical fasting lab AM AFTER apt.  If you have any other questions or concerns, please feel free to call the office or send a message through MyChart. You may also schedule an earlier appointment if necessary.  Additionally, you may be receiving a survey about your experience at our office within a few days to 1 week by e-mail or mail. We value your feedback.  Saralyn Pilar, DO [facility], [place]

## 2020-10-11 DIAGNOSIS — G4700 Insomnia, unspecified: Secondary | ICD-10-CM | POA: Diagnosis not present

## 2020-10-11 DIAGNOSIS — F411 Generalized anxiety disorder: Secondary | ICD-10-CM | POA: Diagnosis not present

## 2020-10-11 DIAGNOSIS — F3340 Major depressive disorder, recurrent, in remission, unspecified: Secondary | ICD-10-CM | POA: Diagnosis not present

## 2020-11-10 ENCOUNTER — Telehealth: Payer: Self-pay | Admitting: Family Medicine

## 2020-11-10 DIAGNOSIS — E782 Mixed hyperlipidemia: Secondary | ICD-10-CM

## 2020-11-10 MED ORDER — SIMVASTATIN 10 MG PO TABS: 10.0000 mg | ORAL_TABLET | Freq: Every day | ORAL | 2 refills | Status: DC

## 2020-11-10 NOTE — Telephone Encounter (Signed)
Medication Refill - Medication: Simvastatin  Has the patient contacted their pharmacy? Yes.   (Agent: If no, request that the patient contact the pharmacy for the refill.) (Agent: If yes, when and what did the pharmacy advise?)  Preferred Pharmacy (with phone number or street name): [id number] - [place], [place] - [address]  Agent: Please be advised that RX refills may take up to 3 business days. We ask that you follow-up with your pharmacy.

## 2020-11-21 ENCOUNTER — Telehealth: Payer: Self-pay

## 2020-11-21 NOTE — Telephone Encounter (Signed)
I reviewed her chart. I do not see where we have discussed itching or this problem before and I do not see any medications listed on her chart or in the past that were used for this problem.  I am okay to call something in if she has taken it before, please let me know the name and dosage of the medication and I can order it - if the problem does not resolve - or worsens, then she should schedule apt in person or virtual.  Amy Pilar, DO [facility] 11/21/2020, 5:35 PM

## 2020-11-21 NOTE — Telephone Encounter (Signed)
Copied from CRM [id number]. Topic: General - Inquiry >> Nov 21, 2020  3:17 PM Daphine Deutscher D wrote: Reason for CRM: Pt called saying she has been having itching with out rash.  This has been going on for a couple of months.  She has had this in the past but it went away and she has never spoken to Dr. Kirtland Bouchard about the itching.  She would like a nurse to call her back.  CB#  [phone]

## 2020-11-21 NOTE — Telephone Encounter (Signed)
As per patient like the same med's for itching which was going on from past three days wants the same Rx that was prescribed in the past --informed patient that if she need apt will call her back.

## 2020-11-22 NOTE — Telephone Encounter (Signed)
Patient does not remember medication name and since not listed in her chart apt is scheduled virtual on 10/24/2020 around 11:20 am.

## 2020-11-22 NOTE — Telephone Encounter (Signed)
Virtual OV 11/23/20

## 2020-11-23 ENCOUNTER — Other Ambulatory Visit: Payer: Self-pay

## 2020-11-23 ENCOUNTER — Encounter: Payer: Self-pay | Admitting: Family Medicine

## 2020-11-23 ENCOUNTER — Telehealth (INDEPENDENT_AMBULATORY_CARE_PROVIDER_SITE_OTHER): Payer: BC Managed Care – PPO | Admitting: Family Medicine

## 2020-11-23 DIAGNOSIS — L299 Pruritus, unspecified: Secondary | ICD-10-CM | POA: Insufficient documentation

## 2020-11-23 MED ORDER — HYDROXYZINE HCL 25 MG PO TABS: 25.0000 mg | ORAL_TABLET | Freq: Three times a day (TID) | ORAL | 1 refills | Status: DC | PRN

## 2020-11-23 NOTE — Assessment & Plan Note (Signed)
Reports has had this in the past and was treated with oral medication with good relief.  Discussed hydroxyzine 25mg  TID PRN for pruritis and patient in agreement with trial.  Will RTC PRN.

## 2020-11-23 NOTE — Progress Notes (Signed)
Virtual Visit via Telephone  The purpose of this virtual visit is to provide medical care while limiting exposure to the novel coronavirus (COVID19) for both patient and office staff.  Consent was obtained for phone visit:  Yes.   Answered questions that patient had about telehealth interaction:  Yes.   I discussed the limitations, risks, security and privacy concerns of performing an evaluation and management service by telephone. I also discussed with the patient that there may be a patient responsible charge related to this service. The patient expressed understanding and agreed to proceed.  Patient is at home and is accessed via telephone Services are provided by Amy Dalton, FNP-C from [facility])  ---------------------------------------------------------------------- Chief Complaint  Patient presents with  . Pruritis    Chronic Intermittent flare up of pruritis all over. The pt state it feels like everything itches on her body. She denies any rash associated with it or any changes with anything that she uses.      S: Reviewed CMA documentation. I have called patient and gathered additional HPI as follows:  Amy Hutchinson presents for telemedicine visit via telephone for concerns of chronic intermittent flare up of pruritis.  Has previously taken medication for this in the past, that was an oral medication, but is unsure of name of prescription.  Denies fevers, rashes, recent insect bites/redness or swelling.  States when she has these episodes of feeling itchy, she tries to work to calm her anxiety down with mild improvement in symptoms.  Patient is currently at home Denies any high risk travel to areas of current concern for COVID19. Denies any known or suspected exposure to person with or possibly with COVID19.  Past Medical History:  Diagnosis Date  . Anxiety   . Back pain   . Depression   . Hyperlipidemia    Social History   Tobacco Use  .  Smoking status: Current Every Day Smoker    Packs/day: 0.40    Years: 16.00    Pack years: 6.40    Types: Cigarettes  . Smokeless tobacco: Never Used  Vaping Use  . Vaping Use: Never used  Substance Use Topics  . Alcohol use: Not Currently  . Drug use: Never    Current Outpatient Medications:  .  alprazolam (XANAX) 2 MG tablet, Take 0.5 tablets (1 mg total) by mouth 4 (four) times daily as needed for sleep or anxiety., Disp: 60 tablet, Rfl: 0 .  citalopram (CELEXA) 40 MG tablet, Take 1 tablet (40 mg total) by mouth daily., Disp: 30 tablet, Rfl: 2 .  QUEtiapine (SEROQUEL) 100 MG tablet, Take 1 tablet (100 mg total) by mouth at bedtime., Disp: 30 tablet, Rfl: 2 .  simvastatin (ZOCOR) 10 MG tablet, Take 1 tablet (10 mg total) by mouth at bedtime., Disp: 30 tablet, Rfl: 2 .  hydrOXYzine (ATARAX/VISTARIL) 25 MG tablet, Take 1 tablet (25 mg total) by mouth 3 (three) times daily as needed., Disp: 90 tablet, Rfl: 1  Depression screen [facility] 2/9 09/27/2020 07/28/2020 06/28/2020  Decreased Interest 0 0 0  Down, Depressed, Hopeless 0 0 0  PHQ - 2 Score 0 0 0  Altered sleeping 0 0 0  Tired, decreased energy 0 0 0  Change in appetite 0 0 0  Feeling bad or failure about yourself  0 0 0  Trouble concentrating 0 0 0  Moving slowly or fidgety/restless 0 0 0  Suicidal thoughts 0 0 0  PHQ-9 Score 0 0 0  Difficult  doing work/chores Not difficult at all Not difficult at all Not difficult at all    GAD 7 : Generalized Anxiety Score 09/27/2020 07/28/2020 06/28/2020 06/27/2020  Nervous, Anxious, on Edge 3 1 2 2   Control/stop worrying 0 1 3 3   Worry too much - different things 1 0 0 0  Trouble relaxing 0 0 0 0  Restless 0 0 0 0  Easily annoyed or irritable 0 0 0 0  Afraid - awful might happen 0 0 0 0  Total GAD 7 Score 4 2 5 5   Anxiety Difficulty Not difficult at all Somewhat difficult Not difficult at all Not difficult at all     -------------------------------------------------------------------------- O: No physical exam performed due to remote telephone encounter.  Physical Exam: Patient remotely monitored without video.  Verbal communication appropriate.  Cognition normal.  No results found for this or any previous visit (from the past 2160 hour(s)).  -------------------------------------------------------------------------- A&P:  Problem List Items Addressed This Visit      Musculoskeletal and Integument   Pruritus - Primary    Reports has had this in the past and was treated with oral medication with good relief.  Discussed hydroxyzine 25mg  TID PRN for pruritis and patient in agreement with trial.  Will RTC PRN.      Relevant Medications   hydrOXYzine (ATARAX/VISTARIL) 25 MG tablet      Meds ordered this encounter  Medications  . hydrOXYzine (ATARAX/VISTARIL) 25 MG tablet    Sig: Take 1 tablet (25 mg total) by mouth 3 (three) times daily as needed.    Dispense:  90 tablet    Refill:  1    Follow-up: - Return if symptoms worsen or fail to improve  Patient verbalizes understanding with the above medical recommendations including the limitation of remote medical advice.  Specific follow-up and call-back criteria were given for patient to follow-up or seek medical care more urgently if needed.  - Time spent in direct consultation with patient on phone: 7 minutes  , FNP-C [facility] 11/23/2020, 2:03 PM

## 2020-12-22 ENCOUNTER — Encounter: Payer: BC Managed Care – PPO | Admitting: Family Medicine

## 2020-12-25 ENCOUNTER — Encounter: Payer: Self-pay | Admitting: Family Medicine

## 2020-12-25 ENCOUNTER — Ambulatory Visit (INDEPENDENT_AMBULATORY_CARE_PROVIDER_SITE_OTHER): Payer: BC Managed Care – PPO | Admitting: Family Medicine

## 2020-12-25 ENCOUNTER — Other Ambulatory Visit: Payer: Self-pay

## 2020-12-25 VITALS — BP 101/67 | HR 76 | Ht 64.0 in | Wt 168.0 lb

## 2020-12-25 DIAGNOSIS — M48061 Spinal stenosis, lumbar region without neurogenic claudication: Secondary | ICD-10-CM

## 2020-12-25 DIAGNOSIS — F410 Panic disorder [episodic paroxysmal anxiety] without agoraphobia: Secondary | ICD-10-CM | POA: Diagnosis not present

## 2020-12-25 DIAGNOSIS — F411 Generalized anxiety disorder: Secondary | ICD-10-CM | POA: Diagnosis not present

## 2020-12-25 DIAGNOSIS — M5441 Lumbago with sciatica, right side: Secondary | ICD-10-CM | POA: Diagnosis not present

## 2020-12-25 DIAGNOSIS — Z1231 Encounter for screening mammogram for malignant neoplasm of breast: Secondary | ICD-10-CM | POA: Diagnosis not present

## 2020-12-25 DIAGNOSIS — M5442 Lumbago with sciatica, left side: Secondary | ICD-10-CM | POA: Diagnosis not present

## 2020-12-25 DIAGNOSIS — F3340 Major depressive disorder, recurrent, in remission, unspecified: Secondary | ICD-10-CM | POA: Diagnosis not present

## 2020-12-25 DIAGNOSIS — G8929 Other chronic pain: Secondary | ICD-10-CM

## 2020-12-25 DIAGNOSIS — G4700 Insomnia, unspecified: Secondary | ICD-10-CM | POA: Diagnosis not present

## 2020-12-25 MED ORDER — PREDNISONE 10 MG PO TABS: ORAL_TABLET | ORAL | 0 refills | Status: DC

## 2020-12-25 MED ORDER — CYCLOBENZAPRINE HCL 10 MG PO TABS: 10.0000 mg | ORAL_TABLET | Freq: Three times a day (TID) | ORAL | 2 refills | Status: DC | PRN

## 2020-12-25 NOTE — Patient Instructions (Addendum)
Prednisone Flexeril Refer to Neurosurgery [facility]  Please schedule a Follow-up Appointment to: Return in about 4 weeks (around 01/22/2021), or if symptoms worsen or fail to improve, for 4-6 week annual physical.  If you have any other questions or concerns, please feel free to call the office or send a message through MyChart. You may also schedule an earlier appointment if necessary.  Additionally, you may be receiving a survey about your experience at our office within a few days to 1 week by e-mail or mail. We value your feedback.  Saralyn Pilar, DO [facility], [place]

## 2020-12-25 NOTE — Progress Notes (Addendum)
Subjective:    Patient ID: Amy Hutchinson, female    DOB: [birth year], 60 y.o.   MRN: [id number]  Amy Hutchinson is a 60 y.o. female presenting on 12/25/2020 for Back Pain   HPI   Low Back Pain, Spinal Stenosis, Lumbar OA/DJD Chronic problem for years Last seen out of state [place] previous spine specialist, received Lumbar ESI with relief from steroid injection, temporarily, also reports X-ray MRI around 05/2020, has known history Cervical spinal stenosis. - She reports now gradual worsening with low back bilateral pain, can be sharper pain at times, from tailbone up causes pain into legs and them to go "numb" and weak and episodes can trigger her to fall, most days occur, better if lean forward less symptoms, takes ibuprofen aleve PRN  Health Maintenance: Due mammogram, needs order, prior mammogram abnormal spot, had biopsy 1-2 yr ago negative now due repeat  Depression screen [facility] 2/9 09/27/2020 07/28/2020 06/28/2020  Decreased Interest 0 0 0  Down, Depressed, Hopeless 0 0 0  PHQ - 2 Score 0 0 0  Altered sleeping 0 0 0  Tired, decreased energy 0 0 0  Change in appetite 0 0 0  Feeling bad or failure about yourself  0 0 0  Trouble concentrating 0 0 0  Moving slowly or fidgety/restless 0 0 0  Suicidal thoughts 0 0 0  PHQ-9 Score 0 0 0  Difficult doing work/chores Not difficult at all Not difficult at all Not difficult at all    Social History   Tobacco Use  . Smoking status: Current Every Day Smoker    Packs/day: 0.40    Years: 16.00    Pack years: 6.40    Types: Cigarettes  . Smokeless tobacco: Never Used  Vaping Use  . Vaping Use: Never used  Substance Use Topics  . Alcohol use: Not Currently  . Drug use: Never    Review of Systems Per HPI unless specifically indicated above     Objective:    BP 101/67   Pulse 76   Ht 5\' 4"  (1.626 m)   Wt 168 lb (76.2 kg)   SpO2 98%   BMI 28.84 kg/m   Wt Readings from Last 3 Encounters:  12/25/20 168 lb (76.2 kg)  07/28/20 158 lb  (71.7 kg)  06/27/20 162 lb 9.6 oz (73.8 kg)    Physical Exam Vitals and nursing note reviewed.  Constitutional:      General: She is not in acute distress.    Appearance: She is well-developed and well-nourished. She is not diaphoretic.     Comments: Well-appearing, comfortable, cooperative  HENT:     Head: Normocephalic and atraumatic.     Mouth/Throat:     Mouth: Oropharynx is clear and moist.  Eyes:     General:        Right eye: No discharge.        Left eye: No discharge.     Conjunctiva/sclera: Conjunctivae normal.  Cardiovascular:     Rate and Rhythm: Normal rate.  Pulmonary:     Effort: Pulmonary effort is normal.  Musculoskeletal:        General: No edema.     Comments: Low back pain not reproduced on palpation, some muscle hypertonicity, no obvious provoked symptoms SLR but some tightness and pulling.  Skin:    General: Skin is warm and dry.     Findings: No erythema or rash.  Neurological:     Mental Status: She is alert and oriented to person,  place, and time.  Psychiatric:        Mood and Affect: Mood and affect normal.        Behavior: Behavior normal.     Comments: Well groomed, good eye contact, normal speech and thoughts    I have personally reviewed the radiology report from Lumbar X-ray 03/30/15  Impression Performed by [facility] LABORATORY IMPRESSION:  Mild degenerative disc disease, mainly at L4-5.   No acute abnormality identified.  Urgency: Routine. This is a routine medical imaging report.  Recommendation: No specific imaging recommendation.      Signed by Faythe Ghee, MD on 03/30/2015 2:40 PM  Narrative Performed by [facility] LABORATORY Procedure(s): XR LUMBAR SPINE MIN 4 VIEWS (STANDARD)  Date of service: 03/30/2015 2:36 PM   Provided clinical information: 54 years, Female, "pain"  Procedure and materials: Standard protocol.  Potential limitations: None.  Comparison studies: None.  Observations:   5 views of the  lumbar spine show vertebral body heights to be  preserved. Alignment is normal. There is probably some narrowing of  the L4-5 disc space, but others appear preserved. Spurring is minimal.  No destructive process. There is minimal scoliosis convex to the left  in the lower lumbar region.   Sacroiliac joints and sacral foramina are unremarkable. Soft tissues  are unremarkable. Be preserved.  Procedure Note  Faythe Ghee, MD - 03/30/2015  Formatting of this note might be different from the original.  Procedure(s): XR LUMBAR SPINE MIN 4 VIEWS (STANDARD)  Date of service: 03/30/2015 2:36 PM   Provided clinical information: 54 years, Female, "pain"  Procedure and materials: Standard protocol.  Potential limitations: None.  Comparison studies: None.  Observations:   5 views of the lumbar spine show vertebral body heights to be  preserved. Alignment is normal. There is probably some narrowing of  the L4-5 discspace, but others appear preserved. Spurring is minimal.  No destructive process. There is minimal scoliosis convex to the left  in the lower lumbar region.   Sacroiliac joints and sacral foramina are unremarkable. Soft tissues  are unremarkable. Be preserved.   IMPRESSION:  Mild degenerative disc disease, mainly at L4-5.   No acute abnormality identified.  Urgency: Routine. This is a routine medical imaging report.  Recommendation: No specific imaging recommendation.      Signed by Faythe Ghee, MD on 03/30/2015 2:40 PM Exam End: 03/30/15 2:38 PM   Specimen Collected: 03/30/15 2:36 PM Last Resulted: 03/30/15 2:40 PM  Received From: [facility]  Result Received: 06/27/20 10:05 PM     No results found for this or any previous visit.    Assessment & Plan:   Problem List Items Addressed This Visit   None   Visit Diagnoses    Chronic bilateral low back pain with bilateral sciatica    -  Primary   Relevant Medications   predniSONE (DELTASONE) 10 MG tablet    cyclobenzaprine (FLEXERIL) 10 MG tablet   Other Relevant Orders   Ambulatory referral to Neurosurgery   Spinal stenosis of lumbar region without neurogenic claudication       Relevant Medications   predniSONE (DELTASONE) 10 MG tablet   cyclobenzaprine (FLEXERIL) 10 MG tablet   Other Relevant Orders   Ambulatory referral to Neurosurgery   Encounter for screening mammogram for malignant neoplasm of breast       Relevant Orders   MM DIGITAL SCREENING BILATERAL      Subacute on chronic bilateral LBP with associated R/L sciatica vs paresthesia  vs weakness episodes into both lower extremity causing falls. Known OA/DJD Spinal Stenosis Cervical Spine, prior history also in Lumbar spine. No known acute injury Prior out of state specialists in [place] cortisone ESI Lumbar with relief temporarily  Plan: 1. Start Prednisone taper 60 to 10mg  over 6 days 2. Start muscle relaxant with Flexeril 10mg  tabs - take 5-10mg  up to TID PRN, titrate up as tolerated caution sedation 3. May use Tylenol PRN for breakthrough 4. Encouraged use of heating pad 1-2x daily for now then PRN 5. REFERRAL To [facility]   #Due for mammogram Ordered routine mammo, [facility], patient to schedule. Get last record from [facility]   Orders Placed This Encounter  Procedures  . MM DIGITAL SCREENING BILATERAL    Standing Status:   Future    Standing Expiration Date:   06/24/2021    Order Specific Question:   Reason for Exam (SYMPTOM  OR DIAGNOSIS REQUIRED)    Answer:   Routine screening bilateral mammogram. May change to Bilateral MM Tomo screening if indicated and appropriate for patient/insurance    Order Specific Question:   Preferred imaging location?    Answer:   [facility]  . Ambulatory referral to Neurosurgery    Referral Priority:   Routine    Referral Type:   Surgical    Referral Reason:   Specialty Services Required    Requested Specialty:   Neurosurgery    Number of Visits Requested:    1     Meds ordered this encounter  Medications  . predniSONE (DELTASONE) 10 MG tablet    Sig: Take 6 tabs with breakfast Day 1, 5 tabs Day 2, 4 tabs Day 3, 3 tabs Day 4, 2 tabs Day 5, 1 tab Day 6.    Dispense:  21 tablet    Refill:  0  . cyclobenzaprine (FLEXERIL) 10 MG tablet    Sig: Take 1 tablet (10 mg total) by mouth 3 (three) times daily as needed for muscle spasms.    Dispense:  60 tablet    Refill:  2      Follow up plan: No follow-ups on file.  Future re-schedule physical 4-6 weeks.  [place], DO [facility] 12/25/2020, 12:21 PM

## 2020-12-28 ENCOUNTER — Encounter: Payer: BC Managed Care – PPO | Admitting: Family Medicine

## 2021-01-26 ENCOUNTER — Telehealth: Payer: Self-pay | Admitting: Family Medicine

## 2021-01-26 NOTE — Telephone Encounter (Signed)
It looks like she talked to Danielle Rankin, FNP back in 11/2020 for this particular problem. She has history of itching episodes in past as well.  She was prescribed Hydroxyzine as needed for itching.  Unfortunately there are not many other medications for itching available.  With her current medications we do need to be cautious about adding medications to avoid side effect.  Sometimes itching is a medication side effect or it could be related to environmental or dietary causes even.  If she has rash or other allergy symptoms, she may warrant an Allergist evaluation to do testing.  Also, we do not have any recent blood work on her.  I would recommend that in the next few weeks to months we should do an Annual Physical and can check routine blood work may also help provide an answer to the itching.  Amy Pilar, DO [facility] 01/26/2021, 2:17 PM

## 2021-01-26 NOTE — Telephone Encounter (Signed)
Pt was prescribed hydrOXYzine (ATARAX/VISTARIL) 25 MG tablet  For itching all over her body but it has not helped much and the itching seems to be getting worse and pt uses all hypoallergenic products / Pt would like to know what Dr. Althea Charon would recommend / please advise

## 2021-01-26 NOTE — Telephone Encounter (Signed)
The patient was notified of Dr. Kirtland Bouchard recommendation. She verbalize understanding, no question or concern.

## 2021-02-02 ENCOUNTER — Other Ambulatory Visit: Payer: Self-pay

## 2021-02-02 DIAGNOSIS — L299 Pruritus, unspecified: Secondary | ICD-10-CM

## 2021-02-02 MED ORDER — HYDROXYZINE HCL 25 MG PO TABS
25.0000 mg | ORAL_TABLET | Freq: Three times a day (TID) | ORAL | 1 refills | Status: DC | PRN
Start: 2021-02-02 — End: 2021-09-20

## 2021-02-15 ENCOUNTER — Other Ambulatory Visit: Payer: Self-pay | Admitting: Family Medicine

## 2021-02-15 DIAGNOSIS — E782 Mixed hyperlipidemia: Secondary | ICD-10-CM

## 2021-02-15 NOTE — Telephone Encounter (Signed)
Requested medications are due for refill today yes  Requested medications are on the active medication list yes  Last refill 01/03/21  Last visit 06/2020 addressed med/dx  Future visit scheduled no  Notes to clinic Failed protocol due to no valid labs within 360 days, no upcoming visit scheduled.

## 2021-02-21 ENCOUNTER — Telehealth: Payer: Self-pay

## 2021-02-21 NOTE — Telephone Encounter (Signed)
Copied from CRM [id number]. Topic: General - Other >> Feb 20, 2021 11:18 AM Gaetana Michaelis A wrote: Reason for CRM: Patient would like to be contacted by a member of staff regarding labs  Patient was told to come to office for lab work relating to an allergy test  Patient would like to know if they need to fast before submitting a sample  Please contact to advise further

## 2021-02-26 ENCOUNTER — Ambulatory Visit: Payer: Self-pay

## 2021-02-26 ENCOUNTER — Emergency Department
Admission: EM | Admit: 2021-02-26 | Discharge: 2021-02-26 | Disposition: A | Discharge status: Home / Self Care | Payer: BC Managed Care – PPO | Attending: Emergency Medicine | Admitting: Emergency Medicine

## 2021-02-26 ENCOUNTER — Other Ambulatory Visit: Payer: Self-pay

## 2021-02-26 ENCOUNTER — Emergency Department: Payer: BC Managed Care – PPO

## 2021-02-26 DIAGNOSIS — H9202 Otalgia, left ear: Secondary | ICD-10-CM | POA: Diagnosis not present

## 2021-02-26 DIAGNOSIS — R519 Headache, unspecified: Secondary | ICD-10-CM | POA: Diagnosis not present

## 2021-02-26 DIAGNOSIS — F17210 Nicotine dependence, cigarettes, uncomplicated: Secondary | ICD-10-CM | POA: Diagnosis not present

## 2021-02-26 DIAGNOSIS — R42 Dizziness and giddiness: Secondary | ICD-10-CM

## 2021-02-26 LAB — URINALYSIS, COMPLETE (UACMP) WITH MICROSCOPIC
Bilirubin Urine: NEGATIVE
Glucose, UA: NEGATIVE mg/dL
Hgb urine dipstick: NEGATIVE
Ketones, ur: NEGATIVE mg/dL
Leukocytes,Ua: NEGATIVE
Nitrite: NEGATIVE
Protein, ur: NEGATIVE mg/dL
Specific Gravity, Urine: 1.008 (ref 1.005–1.030)
WBC, UA: NONE SEEN WBC/hpf (ref 0–5)
pH: 6 (ref 5.0–8.0)

## 2021-02-26 LAB — CBC
HCT: 42.1 % (ref 36.0–46.0)
Hemoglobin: 13.7 g/dL (ref 12.0–15.0)
MCH: 27.9 pg (ref 26.0–34.0)
MCHC: 32.5 g/dL (ref 30.0–36.0)
MCV: 85.7 fL (ref 80.0–100.0)
Platelets: 322 10*3/uL (ref 150–400)
RBC: 4.91 MIL/uL (ref 3.87–5.11)
RDW: 14.4 % (ref 11.5–15.5)
WBC: 12.4 10*3/uL — ABNORMAL HIGH (ref 4.0–10.5)
nRBC: 0 % (ref 0.0–0.2)

## 2021-02-26 LAB — BASIC METABOLIC PANEL
Anion gap: 8 (ref 5–15)
BUN: 14 mg/dL (ref 6–20)
CO2: 23 mmol/L (ref 22–32)
Calcium: 9.2 mg/dL (ref 8.9–10.3)
Chloride: 107 mmol/L (ref 98–111)
Creatinine, Ser: 0.78 mg/dL (ref 0.44–1.00)
GFR, Estimated: >60 mL/min (ref >60)
Glucose, Bld: 150 mg/dL — ABNORMAL HIGH (ref 70–99)
Potassium: 4 mmol/L (ref 3.5–5.1)
Sodium: 138 mmol/L (ref 135–145)

## 2021-02-26 MED ORDER — MECLIZINE HCL 25 MG PO TABS
25.0000 mg | ORAL_TABLET | Freq: Once | ORAL | Status: AC
  Administered 2021-02-26: 25 mg via ORAL
  Filled 2021-02-26: qty 1

## 2021-02-26 MED ORDER — ACETAMINOPHEN 500 MG PO TABS
1000.0000 mg | ORAL_TABLET | Freq: Once | ORAL | Status: AC
  Administered 2021-02-26: 1000 mg via ORAL
  Filled 2021-02-26: qty 2

## 2021-02-26 MED ORDER — MECLIZINE HCL 25 MG PO TABS: 25.0000 mg | ORAL_TABLET | Freq: Three times a day (TID) | ORAL | 0 refills | Status: DC | PRN

## 2021-02-26 NOTE — ED Triage Notes (Signed)
First Nurse Note:  Arrives with husband.  C/O ear pain x 4 days and dizziness and blurred vision since yesterday afternoon at around 1400.    Patient is AAOx3.  Skin warm and dry.  MAE equally  And strong.  Facial movements equal.

## 2021-02-26 NOTE — Telephone Encounter (Signed)
Dizzy sit and standing- 4 days ago ears pain. For the past month having vertigo which makes her feel like she is spinning. Pt stated that she is using her walker and has to have assistance form her spouse. Pt stated that she bumps onto the walls if she walks independently. Pt reports pain to left ear and side of head. Pt is nauseated. Pt has blurred vision. Disposition was to Go to ED/ PCP triage. Called and spoke with Fleet Contras who agreed that pt needs to go to ED. Pt will be going to [facility].   Care advice given and pt verbalized understanding. Reason for Disposition . Loss of vision or double vision (Exception: similar to previous migraines)    Blurred vision  Additional Information . Commented on: SEVERE dizziness (vertigo) (e.g., unable to walk without assistance)    Your husband, use walker . Commented on: Earache    Left ear and side of head pain  Answer Assessment - Initial Assessment Questions 1. DESCRIPTION: "Describe your dizziness."     Feels like she is spinning 2. VERTIGO: "Do you feel like either you or the room is spinning or tilting?"      She is is spinning 3. LIGHTHEADED: "Do you feel lightheaded?" (e.g., somewhat faint, woozy, weak upon standing)     woozy 4. SEVERITY: "How bad is it?"  "Can you walk?"   - MILD: Feels unsteady but walking normally.   - MODERATE: Feels very unsteady when walking, but not falling; interferes with normal activities (e.g., school, work) .   - SEVERE: Unable to walk without falling, or requires assistance to walk without falling.     severe 5. ONSET:  "When did the dizziness begin?"     4 days ago 6. AGGRAVATING FACTORS: "Does anything make it worse?" (e.g., standing, change in head position)     Standing, supine, sitting 7. CAUSE: "What do you think is causing the dizziness?"     Ear but not dizziness is persistant 8. RECURRENT SYMPTOM: "Have you had dizziness before?" If Yes, ask: "When was the last time?" "What happened that time?"     1  month ago - rested and stayed at home 9. OTHER SYMPTOMS: "Do you have any other symptoms?" (e.g., headache, weakness, numbness, vomiting, earache)     Nausea, left of head and ear  Protocols used: DIZZINESS - VERTIGO-A-AH

## 2021-02-26 NOTE — ED Notes (Signed)
Pt alert and oriented X 4, stable for discharge. RR even and unlabored, color WNL. Discussed discharge instructions and follow-up as directed. Discharge medications discussed if prescribed. Pt had opportunity to ask questions, and RN to provide patient/family eduction.  

## 2021-02-26 NOTE — Discharge Instructions (Addendum)
You are being discharged with a prescription for meclizine to use as needed up to 3 times per day for any vertigo and dizziness.  It is safe to take this medication with Zofran as well.  I have also attached the phone number for one of our local ENT doctors to see in the clinic about your recurrent ear infections and vertigo.  You can follow-up with him or any ENT, if you have 1 already of your own.  If you develop any vertigo that will not quit despite the medication, vertigo with fevers, please return to the ED

## 2021-02-26 NOTE — ED Triage Notes (Signed)
See first nurse note. Pt states 4 days ago, pt started having L ear pain. Pt has had multiple ear infections in the past, but yesterday around 1400 constant dizziness and nausea. Pt states this morning at 0700 she had blurry vision. Denies hx of stroke or vertigo. Denies numbness in extremities. Pt is A&Ox4 and NAD.

## 2021-02-26 NOTE — ED Notes (Signed)
See triage note, pt c/o left ear discomfort and dizziness. Pt in NAD, clear speech, alert and oriented. Dr Katrinka Blazing at bedside

## 2021-02-26 NOTE — ED Notes (Signed)
Up to bathroom to provide urine sample. States dizziness is better than previously today. ambulates with family at side, no difficulties.

## 2021-02-26 NOTE — ED Provider Notes (Signed)
[facility] Emergency Department Provider Note ____________________________________________   Event Date/Time   First MD Initiated Contact with Patient 02/26/21 1304     (approximate)  I have reviewed the triage vital signs and the nursing notes.  HISTORY  Chief Complaint Dizziness   HPI Amy Hutchinson is a 60 y.o. femalewho presents to the ED for evaluation of dizziness.  Chart review indicates history of left orbitofrontal craniotomy for surgical removal of a meningioma, performed in [place] in January 2020, Dr. Melvyn Novas at [facility]. She further reports a history of recurrent ear infections, last completed course of antibiotics a few months ago. Patient resided, depression and chronic pain syndrome.  Patient presents to the ED for evaluation of 4 days of left ear pain and 2 days of vertiginous dizziness.  She reports an aching pain behind her left ear without discharge, trauma but has been constant for the past 4 days.  Denies changes to her hearing.  Denies tinnitus.  She reports 2 days of persistent vertiginous dizziness, for which she has not taken medications.  She reports previously being prescribed meclizine for similar symptoms, but she has never had dizziness that persisted this long.  She reports difficulty ambulating at her baseline due to this.  She denies any syncopal episodes or falls to the ground.  Denies fevers, injury or trauma, emesis, chest pain, neck pain, shortness of breath.  She reports some diffusely blurry vision with her vertigo, denies any focal vision changes. She presents with husband, who provides additional history.  Past Medical History:  Diagnosis Date  . Anxiety   . Back pain   . Depression   . Hyperlipidemia     Patient Active Problem List   Diagnosis Date Noted  . Pruritus 11/23/2020  . Psychophysiological insomnia 06/27/2020  . GAD (generalized anxiety disorder) 06/27/2020  . Screening for colon cancer 06/27/2020   . Major depressive disorder, recurrent, in remission (HCC) 06/27/2020  . Mixed hyperlipidemia 06/27/2020  . History of resection of meningioma 06/27/2020  . 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    Past Surgical History:  Procedure Laterality Date  . ABDOMINAL HYSTERECTOMY    . BRAIN SURGERY    . NECK SURGERY    . TONSILLECTOMY AND ADENOIDECTOMY      Prior to Admission medications   Medication Sig Start Date End Date Taking? Authorizing Provider  meclizine (ANTIVERT) 25 MG tablet Take 1 tablet (25 mg total) by mouth 3 (three) times daily as needed for dizziness. 02/26/21  Yes Delton Prairie, MD  alprazolam Prudy Feeler) 2 MG tablet Take 0.5 tablets (1 mg total) by mouth 4 (four) times daily as needed for sleep or anxiety. 08/31/20   Karamalegos, Netta Neat, DO  citalopram (CELEXA) 40 MG tablet Take 1 tablet (40 mg total) by mouth daily. 06/28/20   Karamalegos, Netta Neat, DO  cyclobenzaprine (FLEXERIL) 10 MG tablet Take 1 tablet (10 mg total) by mouth 3 (three) times daily as needed for muscle spasms. 12/25/20   Karamalegos, Netta Neat, DO  hydrOXYzine (ATARAX/VISTARIL) 25 MG tablet Take 1 tablet (25 mg total) by mouth 3 (three) times daily as needed. 02/02/21   Karamalegos, Netta Neat, DO  predniSONE (DELTASONE) 10 MG tablet Take 6 tabs with breakfast Day 1, 5 tabs Day 2, 4 tabs Day 3, 3 tabs Day 4, 2 tabs Day 5, 1 tab Day 6. 12/25/20   Karamalegos, Netta Neat, DO  QUEtiapine (SEROQUEL) 100 MG tablet Take 1 tablet (100 mg total) by mouth  at bedtime. 06/28/20   Karamalegos, Netta Neat, DO  simvastatin (ZOCOR) 10 MG tablet TAKE 1 TABLET BY MOUTH AT BEDTIME 02/16/21   Smitty Cords, DO    Allergies Darvon [propoxyphene], Dilaudid [hydromorphone], Hydrocodone, and Percocet [oxycodone-acetaminophen]  Family History  Problem Relation Age of Onset  . Diabetes Mother   . Bipolar disorder Mother   . Anxiety disorder Mother   . Depression Mother   . Lung cancer Mother         lung  . Anxiety disorder Sister   . Depression Sister   . Anxiety disorder Sister     Social History Social History   Tobacco Use  . Smoking status: Current Every Day Smoker    Packs/day: 0.40    Years: 16.00    Pack years: 6.40    Types: Cigarettes  . Smokeless tobacco: Never Used  Vaping Use  . Vaping Use: Never used  Substance Use Topics  . Alcohol use: Not Currently  . Drug use: Never    Review of Systems  Constitutional: No fever/chills Eyes: No visual changes. ENT: No sore throat. Cardiovascular: Denies chest pain. Respiratory: Denies shortness of breath. Gastrointestinal: No abdominal pain.  No nausea, no vomiting.  No diarrhea.  No constipation. Genitourinary: Negative for dysuria. Musculoskeletal: Negative for back pain. Skin: Negative for rash. Neurological: Negative focal weakness or numbness.  Positive for headaches and dizziness  ____________________________________________   PHYSICAL EXAM:  VITAL SIGNS: Vitals:   02/26/21 1212 02/26/21 1430  BP: 115/73 138/83  Pulse: 85 68  Resp: 16 18  Temp: 98.3 F (36.8 C)   SpO2: 97% 98%     Constitutional: Alert and oriented. Well appearing and in no acute distress.  Able to swing her legs over and stand up from the stretcher, but causes obvious discomfort and she reports severe vertigo with standing.  She is assisted back into bed. Eyes: Conjunctivae are normal. PERRL. EOMI. Head: Atraumatic. Bilateral TMs visualized without erythema, purulence, bulging or fluid level.  Clear bilaterally. Nose: No congestion/rhinnorhea. Mouth/Throat: Mucous membranes are moist.  Oropharynx non-erythematous. Neck: No stridor. No cervical spine tenderness to palpation. Cardiovascular: Normal rate, regular rhythm. Grossly normal heart sounds.  Good peripheral circulation. Respiratory: Normal respiratory effort.  No retractions. Lungs CTAB. Gastrointestinal: Soft , nondistended, nontender to palpation. No CVA  tenderness. Musculoskeletal: No lower extremity tenderness nor edema.  No joint effusions. No signs of acute trauma. Neurologic:  Normal speech and language. No gross focal neurologic deficits are appreciated. Worsening of her dizziness with lateral EOM to the left with only a couple beats of leftward nystagmus.  Mild dysmetria to left upper extremity on finger-nose-finger.  Cranial nerves otherwise intact 5/5 strength and sensation in all 4 extremities Skin:  Skin is warm, dry and intact. No rash noted. Psychiatric: Mood and affect are normal. Speech and behavior are normal.  ____________________________________________   LABS (all labs ordered are listed, but only abnormal results are displayed)  Labs Reviewed  BASIC METABOLIC PANEL - Abnormal; Notable for the following components:      Result Value   Glucose, Bld 150 (*)    All other components within normal limits  CBC - Abnormal; Notable for the following components:   WBC 12.4 (*)    All other components within normal limits  URINALYSIS, COMPLETE (UACMP) WITH MICROSCOPIC - Abnormal; Notable for the following components:   Color, Urine YELLOW (*)    APPearance CLEAR (*)    Bacteria, UA RARE (*)  All other components within normal limits   ____________________________________________  12 Lead EKG  Sinus rhythm, rate of 89 bpm.  Normal axis and intervals.  No evidence of acute ischemia. ____________________________________________  RADIOLOGY  ED MD interpretation: CT head reviewed by me without evidence of acute intracranial pathology.  Official radiology report(s): CT Head Wo Contrast  Result Date: 02/26/2021 CLINICAL DATA:  Dizziness.  Left-sided headache EXAM: CT HEAD WITHOUT CONTRAST TECHNIQUE: Contiguous axial images were obtained from the base of the skull through the vertex without intravenous contrast. COMPARISON:  None. FINDINGS: Brain: Ventricles and sulci are normal in size and configuration. There is no  intracranial mass, hemorrhage, extra-axial fluid collection, or midline shift. Brain parenchyma appears unremarkable. There is no evident acute infarct. Vascular: No hyperdense vessel. Foci of calcification noted in each carotid siphon region. Skull: Patient has had previous craniotomy in the left supraorbital/inferior frontal region. Bony calvarium otherwise appears intact. Sinuses/Orbits: Visualized paranasal sinuses are clear. No intraorbital lesions evident. Other: Mastoid air cells clear. IMPRESSION: Postoperative left supraorbital/inferior frontal craniotomy. No appreciable encephalomalacia in the left frontal lobe. Brain parenchyma appears unremarkable on this study. No mass or hemorrhage. No evidence of acute infarct. There are foci of arterial vascular calcification. Electronically Signed   By: Bretta BangWilliam  Woodruff III M.D.   On: 02/26/2021 13:43    ____________________________________________   PROCEDURES and INTERVENTIONS  Procedure(s) performed (including Critical Care):  .1-3 Lead EKG Interpretation Performed by: Delton PrairieSmith, Jude Naclerio, MD Authorized by: Delton PrairieSmith, Starnisha Batrez, MD     Interpretation: normal     ECG rate:  70   ECG rate assessment: normal     Rhythm: sinus rhythm     Ectopy: none     Conduction: normal      Medications  acetaminophen (TYLENOL) tablet 1,000 mg (1,000 mg Oral Given 02/26/21 1322)  meclizine (ANTIVERT) tablet 25 mg (25 mg Oral Given 02/26/21 1322)    ____________________________________________   MDM / ED COURSE   60 year old woman with history of meningioma resection and ago presents to the ED with acute vertiginous sensation, without evidence of central pathology, and amenable to outpatient management.  Normal vitals on room air.  Exam without evidence of neurovascular deficits, trauma.  No evidence of AOM.  Blood work with mild nonspecific leukocytosis, otherwise unremarkable.  EKG is nonischemic.  CT head demonstrates old postoperative changes that evidence of  new mass, ICH or CVA.  She has resolution of symptoms after single dose of meclizine and Tylenol.  In conjunction with her lateral nystagmus, history of vertigo and resolution with meclizine, I see no indications for MRI imaging to assess for posterior stroke.  She is ambulatory at her baseline, tolerating p.o. intake and asymptomatic at time of discharge.  We discussed return precautions for the ED and I provided a prescription for meclizine as an outpatient.  Patient stable for outpatient management.   Clinical Course as of 02/26/21 1930  Mon Feb 26, 2021  1411 Reassessed.  Patient reports resolution of dizziness.  She is ambulate to the bathroom and reports feeling better.  We discussed outpatient management of vertigo with the addition of meclizine to her regimen as needed.  We discussed return precautions for the ED. [DS]    Clinical Course User Index [DS] Delton PrairieSmith, Jesselyn Rask, MD    ____________________________________________   FINAL CLINICAL IMPRESSION(S) / ED DIAGNOSES  Final diagnoses:  Vertigo     ED Discharge Orders         Ordered    meclizine (ANTIVERT) 25 MG  tablet  3 times daily PRN        02/26/21 1412           Roselene Gray Katrinka Blazing   Note:  This document was prepared using Dragon voice recognition software and may include unintentional dictation errors.   Delton Prairie, MD 02/26/21 Barry Brunner

## 2021-02-26 NOTE — ED Notes (Signed)
Pt only able to take 1 tablet of tylenol (500 mg). States she usually has trouble swallowing pills. Pt declined to try with another drink.

## 2021-03-02 ENCOUNTER — Other Ambulatory Visit: Payer: Self-pay | Admitting: Family Medicine

## 2021-03-02 DIAGNOSIS — Z0000 Encounter for general adult medical examination without abnormal findings: Secondary | ICD-10-CM

## 2021-03-02 DIAGNOSIS — F3340 Major depressive disorder, recurrent, in remission, unspecified: Secondary | ICD-10-CM

## 2021-03-02 DIAGNOSIS — E782 Mixed hyperlipidemia: Secondary | ICD-10-CM

## 2021-03-02 DIAGNOSIS — Z1159 Encounter for screening for other viral diseases: Secondary | ICD-10-CM

## 2021-03-02 DIAGNOSIS — F5104 Psychophysiologic insomnia: Secondary | ICD-10-CM

## 2021-03-02 DIAGNOSIS — R7309 Other abnormal glucose: Secondary | ICD-10-CM

## 2021-03-05 ENCOUNTER — Other Ambulatory Visit: Payer: BC Managed Care – PPO

## 2021-03-05 ENCOUNTER — Other Ambulatory Visit: Payer: Self-pay

## 2021-03-05 DIAGNOSIS — Z1159 Encounter for screening for other viral diseases: Secondary | ICD-10-CM

## 2021-03-05 DIAGNOSIS — R7309 Other abnormal glucose: Secondary | ICD-10-CM

## 2021-03-05 DIAGNOSIS — F3340 Major depressive disorder, recurrent, in remission, unspecified: Secondary | ICD-10-CM

## 2021-03-05 DIAGNOSIS — E782 Mixed hyperlipidemia: Secondary | ICD-10-CM

## 2021-03-05 DIAGNOSIS — Z0000 Encounter for general adult medical examination without abnormal findings: Secondary | ICD-10-CM

## 2021-03-05 DIAGNOSIS — F5104 Psychophysiologic insomnia: Secondary | ICD-10-CM | POA: Diagnosis not present

## 2021-03-06 ENCOUNTER — Other Ambulatory Visit: Payer: Self-pay | Admitting: Family Medicine

## 2021-03-06 ENCOUNTER — Ambulatory Visit (INDEPENDENT_AMBULATORY_CARE_PROVIDER_SITE_OTHER): Payer: BC Managed Care – PPO | Admitting: Family Medicine

## 2021-03-06 ENCOUNTER — Encounter: Payer: Self-pay | Admitting: Family Medicine

## 2021-03-06 ENCOUNTER — Other Ambulatory Visit: Payer: Self-pay

## 2021-03-06 VITALS — BP 109/67 | HR 90 | Temp 97.3°F | Ht 64.0 in | Wt 162.4 lb

## 2021-03-06 DIAGNOSIS — R7303 Prediabetes: Secondary | ICD-10-CM

## 2021-03-06 DIAGNOSIS — Z1231 Encounter for screening mammogram for malignant neoplasm of breast: Secondary | ICD-10-CM

## 2021-03-06 DIAGNOSIS — Z720 Tobacco use: Secondary | ICD-10-CM | POA: Diagnosis not present

## 2021-03-06 DIAGNOSIS — E782 Mixed hyperlipidemia: Secondary | ICD-10-CM

## 2021-03-06 DIAGNOSIS — F5104 Psychophysiologic insomnia: Secondary | ICD-10-CM | POA: Diagnosis not present

## 2021-03-06 DIAGNOSIS — F411 Generalized anxiety disorder: Secondary | ICD-10-CM | POA: Diagnosis not present

## 2021-03-06 DIAGNOSIS — Z0000 Encounter for general adult medical examination without abnormal findings: Secondary | ICD-10-CM

## 2021-03-06 DIAGNOSIS — F17210 Nicotine dependence, cigarettes, uncomplicated: Secondary | ICD-10-CM

## 2021-03-06 DIAGNOSIS — F3340 Major depressive disorder, recurrent, in remission, unspecified: Secondary | ICD-10-CM | POA: Diagnosis not present

## 2021-03-06 LAB — CBC WITH DIFFERENTIAL/PLATELET
Absolute Monocytes: 445 cells/uL (ref 200–950)
Basophils Absolute: 85 cells/uL (ref 0–200)
Basophils Relative: 0.8 %
Eosinophils Absolute: 244 cells/uL (ref 15–500)
Eosinophils Relative: 2.3 %
HCT: 46.1 % — ABNORMAL HIGH (ref 35.0–45.0)
Hemoglobin: 14.7 g/dL (ref 11.7–15.5)
Lymphs Abs: 4802 cells/uL — ABNORMAL HIGH (ref 850–3900)
MCH: 27.9 pg (ref 27.0–33.0)
MCHC: 31.9 g/dL — ABNORMAL LOW (ref 32.0–36.0)
MCV: 87.5 fL (ref 80.0–100.0)
MPV: 10.4 fL (ref 7.5–12.5)
Monocytes Relative: 4.2 %
Neutro Abs: 5024 cells/uL (ref 1500–7800)
Neutrophils Relative %: 47.4 %
Platelets: 367 10*3/uL (ref 140–400)
RBC: 5.27 10*6/uL — ABNORMAL HIGH (ref 3.80–5.10)
RDW: 13.4 % (ref 11.0–15.0)
Total Lymphocyte: 45.3 %
WBC: 10.6 10*3/uL (ref 3.8–10.8)

## 2021-03-06 LAB — COMPLETE METABOLIC PANEL WITH GFR
AG Ratio: 1.8 (calc) (ref 1.0–2.5)
ALT: 11 U/L (ref 6–29)
AST: 13 U/L (ref 10–35)
Albumin: 4.2 g/dL (ref 3.6–5.1)
Alkaline phosphatase (APISO): 90 U/L (ref 37–153)
BUN: 18 mg/dL (ref 7–25)
CO2: 27 mmol/L (ref 20–32)
Calcium: 9.6 mg/dL (ref 8.6–10.4)
Chloride: 106 mmol/L (ref 98–110)
Creat: 0.81 mg/dL (ref 0.50–1.05)
GFR, Est African American: 93 mL/min/{1.73_m2} (ref >=60)
GFR, Est Non African American: 80 mL/min/{1.73_m2} (ref >=60)
Globulin: 2.4 g/dL (calc) (ref 1.9–3.7)
Glucose, Bld: 102 mg/dL — ABNORMAL HIGH (ref 65–99)
Potassium: 4.3 mmol/L (ref 3.5–5.3)
Sodium: 141 mmol/L (ref 135–146)
Total Bilirubin: 0.4 mg/dL (ref 0.2–1.2)
Total Protein: 6.6 g/dL (ref 6.1–8.1)

## 2021-03-06 LAB — LIPID PANEL
Cholesterol: 225 mg/dL — ABNORMAL HIGH (ref <200)
HDL: 39 mg/dL — ABNORMAL LOW (ref >=50)
LDL Cholesterol (Calc): 155 mg/dL (calc) — ABNORMAL HIGH
Non-HDL Cholesterol (Calc): 186 mg/dL (calc) — ABNORMAL HIGH (ref <130)
Total CHOL/HDL Ratio: 5.8 (calc) — ABNORMAL HIGH (ref <5.0)
Triglycerides: 175 mg/dL — ABNORMAL HIGH (ref <150)

## 2021-03-06 LAB — HEMOGLOBIN A1C
Hgb A1c MFr Bld: 5.8 % of total Hgb — ABNORMAL HIGH (ref <5.7)
Mean Plasma Glucose: 120 mg/dL
eAG (mmol/L): 6.6 mmol/L

## 2021-03-06 LAB — HEPATITIS C ANTIBODY
Hepatitis C Ab: NONREACTIVE
SIGNAL TO CUT-OFF: 0.01 (ref <1.00)

## 2021-03-06 LAB — TSH: TSH: 1.75 mIU/L (ref 0.40–4.50)

## 2021-03-06 MED ORDER — NICOTINE 14 MG/24HR TD PT24: 14.0000 mg | MEDICATED_PATCH | Freq: Every day | TRANSDERMAL | 0 refills | Status: DC

## 2021-03-06 MED ORDER — ROSUVASTATIN CALCIUM 10 MG PO TABS
10.0000 mg | ORAL_TABLET | Freq: Every day | ORAL | 3 refills | Status: DC
Start: 2021-03-06 — End: 2022-03-12

## 2021-03-06 NOTE — Patient Instructions (Addendum)
Thank you for coming to the office today.  Stay tuned for a GYN specialist apt for the [place] - I will ask who you should see next and they can schedule you.  ---------------  Start Nicotine Patch 14mg  use daily 24 hr patch for 2-4 weeks then we can reduce to lower dose 7mg  dose for 2-4 more weeks then quit.  [facility]) / Surgery performed at [facility] [address] [place], [address] [place] [facility] - [phone] (direct # or can call the main # [phone])  Stop taking Simvastatin when you get the new cholesterol pill, switch it out with the Rosuvastatin (Generic Crestor) 10mg  nightly.   Recent Labs    03/05/21 0802  HGBA1C 5.8*   Mild elevated A1c sugar, this means you are borderline Pre-diabetes, goal to improve diet.  Diet Recommendations for Pre-Diabetes   Reduce Starchy (carb) foods include: Bread, rice, pasta, potatoes, corn, crackers, bagels, muffins, all baked goods.   Protein foods include: Meat, fish, poultry, eggs, dairy foods, and beans such as pinto and kidney beans (beans also provide carbohydrate).   1. Eat at least 3 meals and 1-2 snacks per day. Never go more than 4-5 hours while awake without eating.   2. Limit starchy foods to TWO per meal and ONE per snack. ONE portion of a starchy  food is equal to the following:   - ONE slice of bread (or its equivalent, such as half of a hamburger bun).   - 1/2 cup of a "scoopable" starchy food such as potatoes or rice.   - 1 OUNCE (28 grams) of starchy snacks (crackers or pretzels, look on label).   - 15 grams of carbohydrate as shown on food label.   3. Both lunch and dinner should include a protein food, a carb food, and vegetables.   - Obtain twice as many veg's as protein or carbohydrate foods for both lunch and dinner.   - Try to keep frozen veg's on hand for a quick vegetable serving.     - Fresh or frozen veg's are best.   4. Breakfast should always include protein.       Please schedule a Follow-up Appointment to: Return in about 6 months (around 09/05/2021) for 6 month fasting lab only then 1 week later Follow-up PreDM, HLD results.  If you have any other questions or concerns, please feel free to call the office or send a message through MyChart. You may also schedule an earlier appointment if necessary.  Additionally, you may be receiving a survey about your experience at our office within a few days to 1 week by e-mail or mail. We value your feedback.  , DO [facility], 11/05/2021

## 2021-03-06 NOTE — Progress Notes (Signed)
Subjective:    Patient ID: Amy Hutchinson, female    DOB: 1961/05/17, 60 y.o.   MRN: [id number]  Amy Hutchinson is a 60 y.o. female presenting on 03/06/2021 for Annual Exam (Pt would like to quit smoking but have a eating plan suggested cause she would not want to gain weight.)    HPI   Here for Annual Physical and Lab Review.  Pre-Diabetes Mild elevated A1c 5.8, prior had high glucose readings No former knowledge of PreDM diagnosis Not limiting sweets, admits cupcakes, but does limit other carbs starches  HYPERLIPIDEMIA: - Reports no concerns. Last lipid panel 03/2021, elevated total, TG and LDL at 155 - Currently taking Simvastatin  nightly, tolerating well without side effects or myalgias Not taken Rosuvastatin before  Generalized Anxiety Disorder Major Depression, chronic recurrent in remission Followed by [facility] - Raynelle Fanning NP on medication management currently with alprazolam Chronic history of anxiety disorder, can be sporadic triggers, sometimes unprovoked, has had long term issue with episodic anxiety flares and some panic. One common trigger, worries about her youngest daughter.  In past followed by Psychiatry over past 20 years, now has no longer returned and has been managed by PCP on current meds.   Today asking about med refills. Currently doing very well on her current med regimen. - Taking Quetiapine  nightly, Citalopram  daily - has meds for next 1-2 weeks. - On Xanax for past 4-5 years. Taking most day - Previous medications tried - Prozac, Paxil, Klonopin, Lexapro, Zoloft.Failed without success  Low Back Pain, Spinal Stenosis, Lumbar OA/DJD Chronic problem for years Taking Flexeril PRN Last seen out of state [place] previous spine specialist, received Lumbar ESI with relief from steroid injection, temporarily, also reports X-ray MRI around 05/2020, has known history Cervical spinal stenosis. - She reports now gradual worsening with low back  bilateral pain, can be sharper pain at times, from tailbone up causes pain into legs and them to go "numb" and weak and episodes can trigger her to fall, most days occur, better if lean forward less symptoms, takes ibuprofen aleve PRN  Additional History  Tobacco Abuse / Nicotine Dependence Started smoking age 61, has quit once before for 1 year cold [place]. Last time did not need any medications. Smoking history with 0.4 to 0.5 ppd.  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 Requesting further consultation.   Health Maintenance:  Mammogram   History of Cologuard. Previously did cologuard a few times in the past, most recent was in [place] last year 1-1.5 years ago, was negative result. Will check into status of last result report.  UTD COVID Vaccine booster pfizer completed 12/12/20  Depression screen [facility] 2/9 03/06/2021 09/27/2020 07/28/2020  Decreased Interest 0 0 0  Down, Depressed, Hopeless 0 0 0  PHQ - 2 Score 0 0 0  Altered sleeping 0 0 0  Tired, decreased energy 0 0 0  Change in appetite 0 0 0  Feeling bad or failure about yourself  0 0 0  Trouble concentrating 0 0 0  Moving slowly or fidgety/restless 0 0 0  Suicidal thoughts 0 0 0  PHQ-9 Score 0 0 0  Difficult doing work/chores Not difficult at all Not difficult at all Not difficult at all   GAD 7 : Generalized Anxiety Score 09/27/2020 07/28/2020 06/28/2020 06/27/2020  Nervous, Anxious, on Edge Control/stop worrying 0 1  3 3  Worry too much - different things 1 0 0 0  Trouble relaxing 0 0 0 0  Restless 0 0 0 0  Easily annoyed or irritable 0 0 0 0  Afraid - awful might happen 0 0 0 0  Total GAD 7 Score 4 2 5 5   Anxiety Difficulty Not difficult at all Somewhat difficult Not difficult at all Not difficult at all     Past Medical History:  Diagnosis Date  . Anxiety   . Back pain   . Depression   .  Hyperlipidemia    Past Surgical History:  Procedure Laterality Date  . ABDOMINAL HYSTERECTOMY    . BRAIN SURGERY    . NECK SURGERY    . TONSILLECTOMY AND ADENOIDECTOMY     Social History   Socioeconomic History  . Marital status: Married    Spouse name: Not on file  . Number of children: Not on file  . Years of education:  . Highest education level: High school graduate  Occupational History  . Not on file  Tobacco Use  . Smoking status: Current Every Day Smoker    Packs/day: 0.40    Years: 32.00    Pack years: 12.80    Types: Cigarettes  . Smokeless tobacco: Never Used  Vaping Use  . Vaping Use: Never used  Substance and Sexual Activity  . Alcohol use: Not Currently  . Drug use: Never  . Sexual activity: Not on file  Other Topics Concern  . Not on file  Social History Narrative  . Not on file   Social Determinants of Health   Financial Resource Strain: Not on file  Food Insecurity: Not on file  Transportation Needs: Not on file  Physical Activity: Not on file  Stress: Not on file  Social Connections: Not on file  Intimate Partner Violence: Not on file   Family History  Problem Relation Age of Onset  . Diabetes Mother   . Bipolar disorder Mother   . Anxiety disorder Mother   . Depression Mother   . Lung cancer Mother        lung  . Anxiety disorder Sister   . Depression Sister   . Anxiety disorder Sister   . Colon cancer Neg Hx   . Breast cancer Neg Hx    Current Outpatient Medications on File Prior to Visit  Medication Sig  . alprazolam (XANAX) 2 MG tablet Take 0.5 tablets (1 mg total) by mouth 4 (four) times daily as needed for sleep or anxiety.  . cyclobenzaprine (FLEXERIL) 10 MG tablet Take 1 tablet (10 mg total) by mouth 3 (three) times daily as needed for muscle spasms.  . hydrOXYzine (ATARAX/VISTARIL) 25 MG tablet Take 1 tablet (25 mg total) by mouth 3 (three) times daily as needed.  12-21-1972 QUEtiapine (SEROQUEL) 100 MG tablet Take 1  tablet (100 mg total) by mouth at bedtime.  . citalopram (CELEXA) 40 MG tablet Take 1 tablet (40 mg total) by mouth daily.   No current facility-administered medications on file prior to visit.    Review of Systems  Constitutional: Negative for activity change, appetite change, chills, diaphoresis, fatigue and fever.  HENT: Negative for congestion and hearing loss.   Eyes: Negative for visual disturbance.  Respiratory: Negative for cough, chest tightness, shortness of breath and wheezing.   Cardiovascular: Negative for chest pain, palpitations and leg swelling.  Gastrointestinal: Negative for abdominal pain, constipation, diarrhea, nausea and vomiting.  Genitourinary: Negative for dysuria,  frequency and hematuria.  Musculoskeletal: Negative for arthralgias and neck pain.  Skin: Negative for rash.  Neurological: Negative for dizziness, weakness, light-headedness, numbness and headaches.  Hematological: Negative for adenopathy.  Psychiatric/Behavioral: Negative for behavioral problems, dysphoric mood and sleep disturbance.   Per HPI unless specifically indicated above      Objective:    BP 109/67 (BP Location: Left Arm, Patient Position: Sitting, Cuff Size: Normal)   Pulse 90   Temp (!) 97.3 F (36.3 C) (Temporal)   Ht 5\' 4"  (1.626 m)   Wt 162 lb 6.4 oz (73.7 kg)   SpO2 97%   BMI 27.88 kg/m   Wt Readings from Last 3 Encounters:  03/06/21 162 lb 6.4 oz (73.7 kg)  02/26/21 158 lb (71.7 kg)  12/25/20 168 lb (76.2 kg)    Physical Exam Vitals and nursing note reviewed.  Constitutional:      General: She is not in acute distress.    Appearance: She is well-developed. She is not diaphoretic.     Comments: Well-appearing, comfortable, cooperative  HENT:     Head: Normocephalic and atraumatic.  Eyes:     General:        Right eye: No discharge.        Left eye: No discharge.     Conjunctiva/sclera: Conjunctivae normal.     Pupils: Pupils are equal, round, and reactive to  light.  Neck:     Thyroid: No thyromegaly.  Cardiovascular:     Rate and Rhythm: Normal rate and regular rhythm.     Heart sounds: Normal heart sounds. No murmur heard.   Pulmonary:     Effort: Pulmonary effort is normal. No respiratory distress.     Breath sounds: Normal breath sounds. No wheezing or rales.  Abdominal:     General: Bowel sounds are normal. There is no distension.     Palpations: Abdomen is soft. There is no mass.     Tenderness: There is no abdominal tenderness.  Musculoskeletal:        General: No tenderness. Normal range of motion.     Cervical back: Normal range of motion and neck supple.     Comments: Upper / Lower Extremities: - Normal muscle tone, strength bilateral upper extremities 5/5, lower extremities 5/5  Lymphadenopathy:     Cervical: No cervical adenopathy.  Skin:    General: Skin is warm and dry.     Findings: No erythema or rash.  Neurological:     Mental Status: She is alert and oriented to person, place, and time.     Comments: Distal sensation intact to light touch all extremities  Psychiatric:        Behavior: Behavior normal.     Comments: Well groomed, good eye contact, normal speech and thoughts    Results for orders placed or performed in visit on 03/05/21  Lipid panel  Result Value Ref Range   Cholesterol 225 (H) <200 mg/dL   HDL 39 (L) > OR = 50 mg/dL   Triglycerides 05/05/21 (H) <150 mg/dL   LDL Cholesterol (Calc) 155 (H) mg/dL (calc)   Total CHOL/HDL Ratio 5.8 (H) <5.0 (calc)   Non-HDL Cholesterol (Calc) 186 (H) <130 mg/dL (calc)  COMPLETE METABOLIC PANEL WITH GFR  Result Value Ref Range   Glucose, Bld 102 (H) 65 - 99 mg/dL   BUN 18 7 - 25 mg/dL   Creat 096 2.83 - 6.62 mg/dL   GFR, Est Non African American 80 > OR = 60 mL/min/1.66m2  GFR, Est African American 93 > OR = 60 mL/min/1.40m2   BUN/Creatinine Ratio NOT APPLICABLE 6 - 22 (calc)   Sodium 141 135 - 146 mmol/L   Potassium 4.3 3.5 - 5.3 mmol/L   Chloride 106 98 - 110  mmol/L   CO2 27 20 - 32 mmol/L   Calcium 9.6 8.6 - 10.4 mg/dL   Total Protein 6.6 6.1 - 8.1 g/dL   Albumin 4.2 3.6 - 5.1 g/dL   Globulin 2.4 1.9 - 3.7 g/dL (calc)   AG Ratio 1.8 1.0 - 2.5 (calc)   Total Bilirubin 0.4 0.2 - 1.2 mg/dL   Alkaline phosphatase (APISO) 90 37 - 153 U/L   AST 13 10 - 35 U/L   ALT 11 6 - 29 U/L  CBC with Differential/Platelet  Result Value Ref Range   WBC 10.6 3.8 - 10.8 Thousand/uL   RBC 5.27 (H) 3.80 - 5.10 Million/uL   Hemoglobin 14.7 11.7 - 15.5 g/dL   HCT 05.3 (H) 97.6 - 73.4 %   MCV 87.5 80.0 - 100.0 fL   MCH 27.9 27.0 - 33.0 pg   MCHC 31.9 (L) 32.0 - 36.0 g/dL   RDW 19.3 79.0 - 24.0 %   Platelets 367 140 - 400 Thousand/uL   MPV 10.4 7.5 - 12.5 fL   Neutro Abs 5,024 1,500 - 7,800 cells/uL   Lymphs Abs 4,802 (H) 850 - 3,900 cells/uL   Absolute Monocytes 445 200 - 950 cells/uL   Eosinophils Absolute 244 15 - 500 cells/uL   Basophils Absolute 85 0 - 200 cells/uL   Neutrophils Relative % 47.4 %   Total Lymphocyte 45.3 %   Monocytes Relative 4.2 %   Eosinophils Relative 2.3 %   Basophils Relative 0.8 %  Hemoglobin A1c  Result Value Ref Range   Hgb A1c MFr Bld 5.8 (H) <5.7 % of total Hgb   Mean Plasma Glucose 120 mg/dL   eAG (mmol/L) 6.6 mmol/L  Hepatitis C antibody  Result Value Ref Range   Hepatitis C Ab NON-REACTIVE NON-REACTI   SIGNAL TO CUT-OFF 0.01 <1.00  TSH  Result Value Ref Range   TSH 1.75 0.40 - 4.50 mIU/L      Assessment & Plan:   Problem List Items Addressed This Visit    Psychophysiological insomnia   Pre-diabetes   Mixed hyperlipidemia   Relevant Medications   rosuvastatin (CRESTOR) 10 MG tablet   Major depressive disorder, recurrent, in remission (HCC)   Relevant Medications   citalopram (CELEXA) 40 MG tablet   GAD (generalized anxiety disorder)   Relevant Medications   citalopram (CELEXA) 40 MG tablet    Other Visit Diagnoses    Annual physical exam    -  Primary   Tobacco abuse       Relevant Medications    nicotine (NICODERM CQ) 14 mg/24hr patch   Cigarette nicotine dependence without complication       Relevant Medications   nicotine (NICODERM CQ) 14 mg/24hr patch      Updated Health Maintenance information Reviewed recent lab results with patient Encouraged improvement to lifestyle with diet and exercise Goal of weight loss  Depression in remission GAD Followed by Psychiatry Continue med management per Psych  PreDM Mild elevated A1c 5.8 Will repeat in 6 months Counseling on lifestyle diet intervention now  Hyperlipidemia Elevated LDL >155 Already on Simvastatin 10mg , seems ineffective Switch to Rosuvastatin 10mg  nightly new rx sent Recheck lipid in 6 months  Smoking cessation Discussion today >5 minutes (<10 minutes)  specifically on counseling on risks of tobacco use, complications, treatment, smoking cessation. Start NRT patches as prescribed. 14 mg up to 1 month then down to 7mg  for 1 month    Will forward a staff message to local GYN provider to inquire about potential referral for Rectal-vaginal fistula options   Meds ordered this encounter  Medications  . rosuvastatin (CRESTOR) 10 MG tablet    Sig: Take 1 tablet (10 mg total) by mouth at bedtime.    Dispense:  90 tablet    Refill:  3    Discontinue Simvastatin, start Rosuvastatin.  . nicotine (NICODERM CQ) 14 mg/24hr patch    Sig: Place 1 patch (14 mg total) onto the skin daily.    Dispense:  28 patch    Refill:  0      Follow up plan: Return in about 6 months (around 09/05/2021) for 6 month fasting lab only then 1 week later Follow-up PreDM, HLD results.   Future lipid A1c ordered for 9/28  Saralyn PilarAlexander Yuan Gann, DO [facility] 03/06/2021, 2:08 PM

## 2021-03-15 ENCOUNTER — Other Ambulatory Visit: Payer: Self-pay | Admitting: Family Medicine

## 2021-03-15 DIAGNOSIS — N823 Fistula of vagina to large intestine: Secondary | ICD-10-CM

## 2021-03-19 DIAGNOSIS — H903 Sensorineural hearing loss, bilateral: Secondary | ICD-10-CM | POA: Diagnosis not present

## 2021-03-19 DIAGNOSIS — R42 Dizziness and giddiness: Secondary | ICD-10-CM | POA: Diagnosis not present

## 2021-03-26 DIAGNOSIS — F411 Generalized anxiety disorder: Secondary | ICD-10-CM | POA: Diagnosis not present

## 2021-03-26 DIAGNOSIS — F410 Panic disorder [episodic paroxysmal anxiety] without agoraphobia: Secondary | ICD-10-CM | POA: Diagnosis not present

## 2021-03-26 DIAGNOSIS — F3340 Major depressive disorder, recurrent, in remission, unspecified: Secondary | ICD-10-CM | POA: Diagnosis not present

## 2021-03-26 DIAGNOSIS — G4700 Insomnia, unspecified: Secondary | ICD-10-CM | POA: Diagnosis not present

## 2021-04-24 DIAGNOSIS — N823 Fistula of vagina to large intestine: Secondary | ICD-10-CM | POA: Diagnosis not present

## 2021-05-04 ENCOUNTER — Telehealth: Payer: Self-pay | Admitting: Family Medicine

## 2021-05-04 DIAGNOSIS — R42 Dizziness and giddiness: Secondary | ICD-10-CM

## 2021-05-04 MED ORDER — MECLIZINE HCL 25 MG PO TABS: 25.0000 mg | ORAL_TABLET | Freq: Three times a day (TID) | ORAL | 2 refills | Status: DC | PRN

## 2021-05-04 NOTE — Telephone Encounter (Signed)
Patient called to request a medication, Meclizine, for her dizziness.  Patient stated she use to take it.  Please advise.

## 2021-05-24 DIAGNOSIS — G8929 Other chronic pain: Secondary | ICD-10-CM | POA: Diagnosis not present

## 2021-05-24 DIAGNOSIS — M5442 Lumbago with sciatica, left side: Secondary | ICD-10-CM | POA: Diagnosis not present

## 2021-06-18 DIAGNOSIS — M5442 Lumbago with sciatica, left side: Secondary | ICD-10-CM | POA: Diagnosis not present

## 2021-06-18 DIAGNOSIS — G8929 Other chronic pain: Secondary | ICD-10-CM | POA: Diagnosis not present

## 2021-06-18 DIAGNOSIS — M6281 Muscle weakness (generalized): Secondary | ICD-10-CM | POA: Diagnosis not present

## 2021-06-25 DIAGNOSIS — G4700 Insomnia, unspecified: Secondary | ICD-10-CM | POA: Diagnosis not present

## 2021-06-25 DIAGNOSIS — F3340 Major depressive disorder, recurrent, in remission, unspecified: Secondary | ICD-10-CM | POA: Diagnosis not present

## 2021-06-25 DIAGNOSIS — F411 Generalized anxiety disorder: Secondary | ICD-10-CM | POA: Diagnosis not present

## 2021-06-25 DIAGNOSIS — F410 Panic disorder [episodic paroxysmal anxiety] without agoraphobia: Secondary | ICD-10-CM | POA: Diagnosis not present

## 2021-06-26 DIAGNOSIS — M5136 Other intervertebral disc degeneration, lumbar region: Secondary | ICD-10-CM | POA: Diagnosis not present

## 2021-06-26 DIAGNOSIS — M5416 Radiculopathy, lumbar region: Secondary | ICD-10-CM | POA: Diagnosis not present

## 2021-06-29 DIAGNOSIS — M5126 Other intervertebral disc displacement, lumbar region: Secondary | ICD-10-CM | POA: Diagnosis not present

## 2021-06-29 DIAGNOSIS — M5416 Radiculopathy, lumbar region: Secondary | ICD-10-CM | POA: Diagnosis not present

## 2021-07-08 ENCOUNTER — Other Ambulatory Visit: Payer: Self-pay

## 2021-07-08 DIAGNOSIS — M79605 Pain in left leg: Secondary | ICD-10-CM | POA: Insufficient documentation

## 2021-07-08 DIAGNOSIS — Z5321 Procedure and treatment not carried out due to patient leaving prior to being seen by health care provider: Secondary | ICD-10-CM | POA: Diagnosis not present

## 2021-07-08 DIAGNOSIS — M545 Low back pain, unspecified: Secondary | ICD-10-CM | POA: Diagnosis not present

## 2021-07-08 NOTE — ED Triage Notes (Signed)
Pt comes pov with lower back pain. Had injections on Friday for the pain but states they didn't work. States left sided pain down her leg. Full function and ROM.

## 2021-07-09 ENCOUNTER — Emergency Department
Admission: RE | Admit: 2021-07-09 | Discharge: 2021-07-09 | Disposition: A | Discharge status: Home / Self Care | Payer: BC Managed Care – PPO | Source: Ambulatory Visit | Attending: Family Medicine | Admitting: Family Medicine

## 2021-07-09 ENCOUNTER — Emergency Department
Admission: EM | Admit: 2021-07-09 | Discharge: 2021-07-09 | Disposition: A | Discharge status: Home / Self Care | Payer: BC Managed Care – PPO | Attending: Emergency Medicine | Admitting: Emergency Medicine

## 2021-07-09 ENCOUNTER — Other Ambulatory Visit: Payer: Self-pay | Admitting: Family Medicine

## 2021-07-09 ENCOUNTER — Other Ambulatory Visit (HOSPITAL_COMMUNITY): Payer: Self-pay | Admitting: Family Medicine

## 2021-07-09 DIAGNOSIS — M5416 Radiculopathy, lumbar region: Secondary | ICD-10-CM | POA: Insufficient documentation

## 2021-07-09 DIAGNOSIS — M5136 Other intervertebral disc degeneration, lumbar region: Secondary | ICD-10-CM | POA: Diagnosis not present

## 2021-07-09 DIAGNOSIS — M545 Low back pain, unspecified: Secondary | ICD-10-CM | POA: Diagnosis not present

## 2021-07-19 DIAGNOSIS — G8929 Other chronic pain: Secondary | ICD-10-CM | POA: Diagnosis not present

## 2021-07-19 DIAGNOSIS — M25552 Pain in left hip: Secondary | ICD-10-CM | POA: Diagnosis not present

## 2021-07-19 DIAGNOSIS — M5442 Lumbago with sciatica, left side: Secondary | ICD-10-CM | POA: Diagnosis not present

## 2021-07-23 DIAGNOSIS — M25552 Pain in left hip: Secondary | ICD-10-CM | POA: Diagnosis not present

## 2021-07-23 DIAGNOSIS — M5136 Other intervertebral disc degeneration, lumbar region: Secondary | ICD-10-CM | POA: Diagnosis not present

## 2021-07-23 DIAGNOSIS — M533 Sacrococcygeal disorders, not elsewhere classified: Secondary | ICD-10-CM | POA: Diagnosis not present

## 2021-07-24 ENCOUNTER — Other Ambulatory Visit (HOSPITAL_COMMUNITY): Payer: Self-pay | Admitting: Sports Medicine

## 2021-07-24 ENCOUNTER — Other Ambulatory Visit: Payer: Self-pay | Admitting: Sports Medicine

## 2021-07-24 DIAGNOSIS — W19XXXA Unspecified fall, initial encounter: Secondary | ICD-10-CM

## 2021-07-24 DIAGNOSIS — M25552 Pain in left hip: Secondary | ICD-10-CM

## 2021-07-24 DIAGNOSIS — M533 Sacrococcygeal disorders, not elsewhere classified: Secondary | ICD-10-CM

## 2021-08-03 ENCOUNTER — Other Ambulatory Visit: Payer: Self-pay

## 2021-08-03 ENCOUNTER — Ambulatory Visit
Admission: RE | Admit: 2021-08-03 | Discharge: 2021-08-03 | Disposition: A | Discharge status: Home / Self Care | Payer: BC Managed Care – PPO | Source: Ambulatory Visit | Attending: Sports Medicine | Admitting: Sports Medicine

## 2021-08-03 DIAGNOSIS — M533 Sacrococcygeal disorders, not elsewhere classified: Secondary | ICD-10-CM

## 2021-08-03 DIAGNOSIS — W19XXXA Unspecified fall, initial encounter: Secondary | ICD-10-CM | POA: Diagnosis not present

## 2021-08-03 DIAGNOSIS — Y929 Unspecified place or not applicable: Secondary | ICD-10-CM | POA: Diagnosis not present

## 2021-08-03 DIAGNOSIS — Y939 Activity, unspecified: Secondary | ICD-10-CM | POA: Diagnosis not present

## 2021-08-03 DIAGNOSIS — M25552 Pain in left hip: Secondary | ICD-10-CM | POA: Diagnosis not present

## 2021-08-03 DIAGNOSIS — X58XXXA Exposure to other specified factors, initial encounter: Secondary | ICD-10-CM | POA: Insufficient documentation

## 2021-08-03 DIAGNOSIS — M76892 Other specified enthesopathies of left lower limb, excluding foot: Secondary | ICD-10-CM | POA: Diagnosis not present

## 2021-08-03 DIAGNOSIS — M1612 Unilateral primary osteoarthritis, left hip: Secondary | ICD-10-CM | POA: Diagnosis not present

## 2021-08-09 DIAGNOSIS — M533 Sacrococcygeal disorders, not elsewhere classified: Secondary | ICD-10-CM | POA: Diagnosis not present

## 2021-08-09 DIAGNOSIS — M25552 Pain in left hip: Secondary | ICD-10-CM | POA: Diagnosis not present

## 2021-08-09 DIAGNOSIS — M1612 Unilateral primary osteoarthritis, left hip: Secondary | ICD-10-CM | POA: Diagnosis not present

## 2021-08-09 DIAGNOSIS — W010XXD Fall on same level from slipping, tripping and stumbling without subsequent striking against object, subsequent encounter: Secondary | ICD-10-CM | POA: Diagnosis not present

## 2021-08-14 DIAGNOSIS — R293 Abnormal posture: Secondary | ICD-10-CM | POA: Diagnosis not present

## 2021-08-14 DIAGNOSIS — M62830 Muscle spasm of back: Secondary | ICD-10-CM | POA: Diagnosis not present

## 2021-08-14 DIAGNOSIS — M9903 Segmental and somatic dysfunction of lumbar region: Secondary | ICD-10-CM | POA: Diagnosis not present

## 2021-08-14 DIAGNOSIS — M9902 Segmental and somatic dysfunction of thoracic region: Secondary | ICD-10-CM | POA: Diagnosis not present

## 2021-08-14 DIAGNOSIS — M62452 Contracture of muscle, left thigh: Secondary | ICD-10-CM | POA: Diagnosis not present

## 2021-08-14 DIAGNOSIS — M9906 Segmental and somatic dysfunction of lower extremity: Secondary | ICD-10-CM | POA: Diagnosis not present

## 2021-08-16 DIAGNOSIS — M9902 Segmental and somatic dysfunction of thoracic region: Secondary | ICD-10-CM | POA: Diagnosis not present

## 2021-08-16 DIAGNOSIS — M62830 Muscle spasm of back: Secondary | ICD-10-CM | POA: Diagnosis not present

## 2021-08-16 DIAGNOSIS — R293 Abnormal posture: Secondary | ICD-10-CM | POA: Diagnosis not present

## 2021-08-16 DIAGNOSIS — M62452 Contracture of muscle, left thigh: Secondary | ICD-10-CM | POA: Diagnosis not present

## 2021-08-16 DIAGNOSIS — M9903 Segmental and somatic dysfunction of lumbar region: Secondary | ICD-10-CM | POA: Diagnosis not present

## 2021-08-16 DIAGNOSIS — M9906 Segmental and somatic dysfunction of lower extremity: Secondary | ICD-10-CM | POA: Diagnosis not present

## 2021-08-21 DIAGNOSIS — M62452 Contracture of muscle, left thigh: Secondary | ICD-10-CM | POA: Diagnosis not present

## 2021-08-21 DIAGNOSIS — M9903 Segmental and somatic dysfunction of lumbar region: Secondary | ICD-10-CM | POA: Diagnosis not present

## 2021-08-21 DIAGNOSIS — M9902 Segmental and somatic dysfunction of thoracic region: Secondary | ICD-10-CM | POA: Diagnosis not present

## 2021-08-21 DIAGNOSIS — M62830 Muscle spasm of back: Secondary | ICD-10-CM | POA: Diagnosis not present

## 2021-08-21 DIAGNOSIS — R293 Abnormal posture: Secondary | ICD-10-CM | POA: Diagnosis not present

## 2021-08-21 DIAGNOSIS — M9906 Segmental and somatic dysfunction of lower extremity: Secondary | ICD-10-CM | POA: Diagnosis not present

## 2021-08-22 DIAGNOSIS — R293 Abnormal posture: Secondary | ICD-10-CM | POA: Diagnosis not present

## 2021-08-22 DIAGNOSIS — M9903 Segmental and somatic dysfunction of lumbar region: Secondary | ICD-10-CM | POA: Diagnosis not present

## 2021-08-22 DIAGNOSIS — M62452 Contracture of muscle, left thigh: Secondary | ICD-10-CM | POA: Diagnosis not present

## 2021-08-22 DIAGNOSIS — M62830 Muscle spasm of back: Secondary | ICD-10-CM | POA: Diagnosis not present

## 2021-08-22 DIAGNOSIS — M9902 Segmental and somatic dysfunction of thoracic region: Secondary | ICD-10-CM | POA: Diagnosis not present

## 2021-08-22 DIAGNOSIS — M9906 Segmental and somatic dysfunction of lower extremity: Secondary | ICD-10-CM | POA: Diagnosis not present

## 2021-08-23 DIAGNOSIS — M9906 Segmental and somatic dysfunction of lower extremity: Secondary | ICD-10-CM | POA: Diagnosis not present

## 2021-08-23 DIAGNOSIS — M62830 Muscle spasm of back: Secondary | ICD-10-CM | POA: Diagnosis not present

## 2021-08-23 DIAGNOSIS — M9902 Segmental and somatic dysfunction of thoracic region: Secondary | ICD-10-CM | POA: Diagnosis not present

## 2021-08-23 DIAGNOSIS — M9903 Segmental and somatic dysfunction of lumbar region: Secondary | ICD-10-CM | POA: Diagnosis not present

## 2021-08-23 DIAGNOSIS — R293 Abnormal posture: Secondary | ICD-10-CM | POA: Diagnosis not present

## 2021-08-23 DIAGNOSIS — M62452 Contracture of muscle, left thigh: Secondary | ICD-10-CM | POA: Diagnosis not present

## 2021-08-27 ENCOUNTER — Other Ambulatory Visit: Payer: BC Managed Care – PPO

## 2021-08-27 ENCOUNTER — Other Ambulatory Visit: Payer: Self-pay

## 2021-08-27 DIAGNOSIS — E782 Mixed hyperlipidemia: Secondary | ICD-10-CM

## 2021-08-27 DIAGNOSIS — R7303 Prediabetes: Secondary | ICD-10-CM

## 2021-08-28 LAB — LIPID PANEL
Cholesterol: 150 mg/dL (ref <200)
HDL: 42 mg/dL — ABNORMAL LOW (ref >=50)
LDL Cholesterol (Calc): 78 mg/dL (calc)
Non-HDL Cholesterol (Calc): 108 mg/dL (calc) (ref <130)
Total CHOL/HDL Ratio: 3.6 (calc) (ref <5.0)
Triglycerides: 200 mg/dL — ABNORMAL HIGH (ref <150)

## 2021-08-28 LAB — HEMOGLOBIN A1C
Hgb A1c MFr Bld: 5.8 % of total Hgb — ABNORMAL HIGH (ref <5.7)
Mean Plasma Glucose: 120 mg/dL
eAG (mmol/L): 6.6 mmol/L

## 2021-08-29 ENCOUNTER — Other Ambulatory Visit: Payer: BC Managed Care – PPO

## 2021-09-03 DIAGNOSIS — R293 Abnormal posture: Secondary | ICD-10-CM | POA: Diagnosis not present

## 2021-09-03 DIAGNOSIS — M62452 Contracture of muscle, left thigh: Secondary | ICD-10-CM | POA: Diagnosis not present

## 2021-09-03 DIAGNOSIS — M9903 Segmental and somatic dysfunction of lumbar region: Secondary | ICD-10-CM | POA: Diagnosis not present

## 2021-09-03 DIAGNOSIS — M9902 Segmental and somatic dysfunction of thoracic region: Secondary | ICD-10-CM | POA: Diagnosis not present

## 2021-09-03 DIAGNOSIS — M62830 Muscle spasm of back: Secondary | ICD-10-CM | POA: Diagnosis not present

## 2021-09-03 DIAGNOSIS — M9906 Segmental and somatic dysfunction of lower extremity: Secondary | ICD-10-CM | POA: Diagnosis not present

## 2021-09-04 DIAGNOSIS — M9903 Segmental and somatic dysfunction of lumbar region: Secondary | ICD-10-CM | POA: Diagnosis not present

## 2021-09-04 DIAGNOSIS — R293 Abnormal posture: Secondary | ICD-10-CM | POA: Diagnosis not present

## 2021-09-04 DIAGNOSIS — M62830 Muscle spasm of back: Secondary | ICD-10-CM | POA: Diagnosis not present

## 2021-09-04 DIAGNOSIS — M9902 Segmental and somatic dysfunction of thoracic region: Secondary | ICD-10-CM | POA: Diagnosis not present

## 2021-09-04 DIAGNOSIS — M9906 Segmental and somatic dysfunction of lower extremity: Secondary | ICD-10-CM | POA: Diagnosis not present

## 2021-09-04 DIAGNOSIS — M62452 Contracture of muscle, left thigh: Secondary | ICD-10-CM | POA: Diagnosis not present

## 2021-09-05 ENCOUNTER — Ambulatory Visit: Payer: BC Managed Care – PPO | Admitting: Family Medicine

## 2021-09-05 ENCOUNTER — Encounter: Payer: Self-pay | Admitting: Family Medicine

## 2021-09-05 ENCOUNTER — Other Ambulatory Visit: Payer: Self-pay

## 2021-09-05 VITALS — BP 123/66 | HR 76 | Ht 64.0 in | Wt 164.2 lb

## 2021-09-05 DIAGNOSIS — E782 Mixed hyperlipidemia: Secondary | ICD-10-CM | POA: Diagnosis not present

## 2021-09-05 DIAGNOSIS — R7303 Prediabetes: Secondary | ICD-10-CM

## 2021-09-05 DIAGNOSIS — M9902 Segmental and somatic dysfunction of thoracic region: Secondary | ICD-10-CM | POA: Diagnosis not present

## 2021-09-05 DIAGNOSIS — Z23 Encounter for immunization: Secondary | ICD-10-CM

## 2021-09-05 DIAGNOSIS — M62452 Contracture of muscle, left thigh: Secondary | ICD-10-CM | POA: Diagnosis not present

## 2021-09-05 DIAGNOSIS — F5104 Psychophysiologic insomnia: Secondary | ICD-10-CM

## 2021-09-05 DIAGNOSIS — R293 Abnormal posture: Secondary | ICD-10-CM | POA: Diagnosis not present

## 2021-09-05 DIAGNOSIS — M9903 Segmental and somatic dysfunction of lumbar region: Secondary | ICD-10-CM | POA: Diagnosis not present

## 2021-09-05 DIAGNOSIS — F411 Generalized anxiety disorder: Secondary | ICD-10-CM

## 2021-09-05 DIAGNOSIS — M9906 Segmental and somatic dysfunction of lower extremity: Secondary | ICD-10-CM | POA: Diagnosis not present

## 2021-09-05 DIAGNOSIS — M62830 Muscle spasm of back: Secondary | ICD-10-CM | POA: Diagnosis not present

## 2021-09-05 NOTE — Assessment & Plan Note (Signed)
Followed by [facility] On medication management with improvement

## 2021-09-05 NOTE — Assessment & Plan Note (Signed)
Well-controlled Pre-DM with A1c 5.8 stable  Plan:  1. Not on any therapy currently  2. Encourage improved lifestyle - low carb, low sugar diet, reduce portion size, continue improving regular exercise

## 2021-09-05 NOTE — Patient Instructions (Addendum)
Thank you for coming to the office today.  Recent Labs    03/05/21 0802 08/27/21 0815  HGBA1C 5.8* 5.8*   Pre DIabetic range. Keep up good work to maintain this.  Lipid Panel     Component Value Date/Time   CHOL 150 08/27/2021 0815   TRIG 200 (H) 08/27/2021 0815   HDL 42 (L) 08/27/2021 0815   CHOLHDL 3.6 08/27/2021 0815   LDLCALC 78 08/27/2021 0815   Improved cholesterol!  COVID Chief Operating Officer when ready  Flu Shot today   For Mammogram screening for breast cancer   Call the Imaging Center below anytime to schedule your own appointment now that order has been placed.  [facility] [address] [place], [place] [address] Phone: [phone]  DUE for FASTING BLOOD WORK (no food or drink after midnight before the lab appointment, only water or coffee without cream/sugar on the morning of)  Lab in 6 months  Please schedule a Follow-up Appointment to: Return in about 6 months (around 03/06/2022) for 6 month Annual Physical AM apt Fasting lab AFTER.  If you have any other questions or concerns, please feel free to call the office or send a message through MyChart. You may also schedule an earlier appointment if necessary.  Additionally, you may be receiving a survey about your experience at our office within a few days to 1 week by e-mail or mail. We value your feedback.  Saralyn Pilar, DO [facility], [place]

## 2021-09-05 NOTE — Assessment & Plan Note (Signed)
Significantly improved on switched to higher potency statin Off Simvastatin 10 now on Rosuvastat 10mg  Last lipid panel 08/2021 The 10-year ASCVD risk score (Arnett DK, et al., 2019) is: 5.3%  Plan: 1. Continue current meds - Rosuvastatin 10mg  nightly 2. Encourage improved lifestyle - low carb/cholesterol, reduce portion size, continue improving regular exercise

## 2021-09-05 NOTE — Assessment & Plan Note (Signed)
Followed by [facility] On medication management, reviewed PDMP

## 2021-09-05 NOTE — Progress Notes (Signed)
Subjective:    Patient ID: Amy Hutchinson, female    DOB: [birth year], 60 y.o.   MRN: [id number]  Amy Hutchinson is a 60 y.o. female presenting on 09/05/2021 for Prediabetes and Hyperlipidemia   HPI  Pre-Diabetes Mild elevated A1c 5.8, prior had high glucose readings, this is stable from last check. She has fam history of Diabetes Trying to limit diet Admits some sugar in diet   HYPERLIPIDEMIA: - Reports no concerns. Last lipid panel 09/2021 improved significantly after switched Simvastatin 10mg  to Rosuvastatin 10mg  - Now results with LDL 155 down to 78 and total chol 225 down to 150. - Currently taking Rosuvastatin 10mg  nightly, tolerating well without side effects or myalgias   Generalized Anxiety Disorder Major Depression, chronic recurrent in remission Chronic history of anxiety disorder, can be sporadic triggers, sometimes unprovoked, has had long term issue with episodic anxiety flares and some panic. Followed by [facility] - NP on medication management currently with alprazolam Continues on medication management with Alprazolam, Citalopram, Quetiapine  Low Back Pain, Spinal Stenosis, Lumbar OA/DJD Chronic problem for years Taking Flexeril PRN Last seen out of state [place] previous spine specialist, received Lumbar ESI with relief from steroid injection, temporarily, also reports X-ray MRI around 05/2020, has known history Cervical spinal stenosis. - She reports now gradual worsening with low back bilateral pain, can be sharper pain at times, from tailbone up causes pain into legs and them to go "numb" and weak and episodes can trigger her to fall, most days occur, better if lean forward less symptoms, takes ibuprofen aleve PRN   Health Maintenance:   Mammogram  due for scheduling. Order is already in.   History of Cologuard. Previously did cologuard a few times in the past, most recent was in last year 1-1.5 years ago, was negative result.  She declines  repeat Cologuard or Colonoscopy.   Due for Flu Shot, will receive today    COVID Vaccine booster pfizer completed 12/12/20, now due for 2nd updated booster now at pharmacy    Depression screen [facility] 2/9 09/05/2021 03/06/2021 09/27/2020  Decreased Interest 0 0 0  Down, Depressed, Hopeless 0 0 0  PHQ - 2 Score 0 0 0  Altered sleeping 0 0 0  Tired, decreased energy 0 0 0  Change in appetite 0 0 0  Feeling bad or failure about yourself  0 0 0  Trouble concentrating 0 0 0  Moving slowly or fidgety/restless 0 0 0  Suicidal thoughts 0 0 0  PHQ-9 Score 0 0 0  Difficult doing work/chores Not difficult at all Not difficult at all Not difficult at all    Social History   Tobacco Use   Smoking status: Every Day    Packs/day: 0.40    Years: 32.00    Pack years: 12.80    Types: Cigarettes   Smokeless tobacco: Never  Vaping Use   Vaping Use: Never used  Substance Use Topics   Alcohol use: Not Currently   Drug use: Never    Review of Systems Per HPI unless specifically indicated above     Objective:    BP 123/66   Pulse 76   Ht 5\' 4"  (1.626 m)   Wt 164 lb 3.2 oz (74.5 kg)   SpO2 97%   BMI 28.18 kg/m   Wt Readings from Last 3 Encounters:  09/05/21 164 lb 3.2 oz (74.5 kg)  07/08/21 161 lb (73 kg)  03/06/21 162 lb 6.4 oz (73.7 kg)  Physical Exam Vitals and nursing note reviewed.  Constitutional:      General: She is not in acute distress.    Appearance: Normal appearance. She is well-developed. She is not diaphoretic.     Comments: Well-appearing, comfortable, cooperative  HENT:     Head: Normocephalic and atraumatic.  Eyes:     General:        Right eye: No discharge.        Left eye: No discharge.     Conjunctiva/sclera: Conjunctivae normal.  Cardiovascular:     Rate and Rhythm: Normal rate.  Pulmonary:     Effort: Pulmonary effort is normal.  Skin:    General: Skin is warm and dry.     Findings: No erythema or rash.  Neurological:     Mental Status: She is  alert and oriented to person, place, and time.  Psychiatric:        Mood and Affect: Mood normal.        Behavior: Behavior normal.        Thought Content: Thought content normal.     Comments: Well groomed, good eye contact, normal speech and thoughts   Results for orders placed or performed in visit on 08/27/21  Hemoglobin A1c  Result Value Ref Range   Hgb A1c MFr Bld 5.8 (H) <5.7 % of total Hgb   Mean Plasma Glucose 120 mg/dL   eAG (mmol/L) 6.6 mmol/L  Lipid panel  Result Value Ref Range   Cholesterol 150 <200 mg/dL   HDL 42 (L) > OR = 50 mg/dL   Triglycerides 097 (H) <150 mg/dL   LDL Cholesterol (Calc) 78 mg/dL (calc)   Total CHOL/HDL Ratio 3.6 <5.0 (calc)   Non-HDL Cholesterol (Calc) 108 <130 mg/dL (calc)      Assessment & Plan:   Problem List Items Addressed This Visit     Psychophysiological insomnia    Followed by [facility] On medication management with improvement      Pre-diabetes - Primary    Well-controlled Pre-DM with A1c 5.8 stable  Plan:  1. Not on any therapy currently  2. Encourage improved lifestyle - low carb, low sugar diet, reduce portion size, continue improving regular exercise      Mixed hyperlipidemia    Significantly improved on switched to higher potency statin Off Simvastatin 10 now on Rosuvastat 10mg  Last lipid panel 08/2021 The 10-year ASCVD risk score (Arnett DK, et al., 2019) is: 5.3%  Plan: 1. Continue current meds - Rosuvastatin 10mg  nightly 2. Encourage improved lifestyle - low carb/cholesterol, reduce portion size, continue improving regular exercise      GAD (generalized anxiety disorder)    Followed by Psychiat[facility] On medication management, reviewed PDMP      Other Visit Diagnoses     Needs flu shot       Relevant Orders   Flu Vaccine QUAD 95mo+IM (Fluarix, Fluzone & Alfiuria Quad PF) (Completed)          No orders of the defined types were placed in this encounter.     Follow  up plan: Return in about 6 months (around 03/06/2022) for 6 month Annual Physical AM apt Fasting lab AFTER.   5mo, DO [facility] 09/05/2021, 3:06 PM

## 2021-09-20 ENCOUNTER — Other Ambulatory Visit: Payer: Self-pay | Admitting: Family Medicine

## 2021-09-20 DIAGNOSIS — L299 Pruritus, unspecified: Secondary | ICD-10-CM

## 2021-09-20 NOTE — Telephone Encounter (Signed)
Medication Refill - Medication:  hydrOXYzine (ATARAX/VISTARIL) 25 MG tablet  Has the patient contacted their pharmacy? No. Was only given for temporary   Preferred Pharmacy (with phone number or street name):  [facility] [address] [place] - [address]  [address] [place] [address]  Phone:  [phone]  Fax:  [phone]    Has the patient been seen for an appointment in the last year OR does the patient have an upcoming appointment? Yes.    *Pt itching has started back, please advise*  Agent: Please be advised that RX refills may take up to 3 business days. We ask that you follow-up with your pharmacy.

## 2021-09-21 DIAGNOSIS — F410 Panic disorder [episodic paroxysmal anxiety] without agoraphobia: Secondary | ICD-10-CM | POA: Diagnosis not present

## 2021-09-21 DIAGNOSIS — F3340 Major depressive disorder, recurrent, in remission, unspecified: Secondary | ICD-10-CM | POA: Diagnosis not present

## 2021-09-21 DIAGNOSIS — F411 Generalized anxiety disorder: Secondary | ICD-10-CM | POA: Diagnosis not present

## 2021-09-21 MED ORDER — HYDROXYZINE HCL 25 MG PO TABS: 25.0000 mg | ORAL_TABLET | Freq: Three times a day (TID) | ORAL | 1 refills | Status: DC | PRN

## 2021-09-21 NOTE — Telephone Encounter (Signed)
Requested Prescriptions  Pending Prescriptions Disp Refills  . hydrOXYzine (ATARAX/VISTARIL) 25 MG tablet 90 tablet 1    Sig: Take 1 tablet (25 mg total) by mouth 3 (three) times daily as needed.     Ear, Nose, and Throat:  Antihistamines Passed - 09/20/2021  6:26 PM      Passed - Valid encounter within last 12 months    Recent Outpatient Visits          2 weeks ago Pre-diabetes   [facility] [place], Netta Neat, DO   6 months ago Annual physical exam   [facility] Smitty Cords, DO   9 months ago Chronic bilateral low back pain with bilateral sciatica   [facility] Smitty Cords, DO   10 months ago Pruritus   [facility], Jodelle Gross, [place]   11 months ago Viral URI with cough   [facility] Charon, Netta Neat, DO      Future Appointments            In 5 months Althea Charon, Netta Neat, DO [facility]

## 2021-10-08 DIAGNOSIS — M62830 Muscle spasm of back: Secondary | ICD-10-CM | POA: Diagnosis not present

## 2021-10-08 DIAGNOSIS — M9902 Segmental and somatic dysfunction of thoracic region: Secondary | ICD-10-CM | POA: Diagnosis not present

## 2021-10-08 DIAGNOSIS — M9906 Segmental and somatic dysfunction of lower extremity: Secondary | ICD-10-CM | POA: Diagnosis not present

## 2021-10-08 DIAGNOSIS — M9903 Segmental and somatic dysfunction of lumbar region: Secondary | ICD-10-CM | POA: Diagnosis not present

## 2021-10-08 DIAGNOSIS — R293 Abnormal posture: Secondary | ICD-10-CM | POA: Diagnosis not present

## 2021-10-08 DIAGNOSIS — M62452 Contracture of muscle, left thigh: Secondary | ICD-10-CM | POA: Diagnosis not present

## 2021-10-17 DIAGNOSIS — M62452 Contracture of muscle, left thigh: Secondary | ICD-10-CM | POA: Diagnosis not present

## 2021-10-17 DIAGNOSIS — R293 Abnormal posture: Secondary | ICD-10-CM | POA: Diagnosis not present

## 2021-10-17 DIAGNOSIS — M9906 Segmental and somatic dysfunction of lower extremity: Secondary | ICD-10-CM | POA: Diagnosis not present

## 2021-10-17 DIAGNOSIS — M9903 Segmental and somatic dysfunction of lumbar region: Secondary | ICD-10-CM | POA: Diagnosis not present

## 2021-10-17 DIAGNOSIS — M9902 Segmental and somatic dysfunction of thoracic region: Secondary | ICD-10-CM | POA: Diagnosis not present

## 2021-10-17 DIAGNOSIS — M62830 Muscle spasm of back: Secondary | ICD-10-CM | POA: Diagnosis not present

## 2021-10-22 DIAGNOSIS — M9906 Segmental and somatic dysfunction of lower extremity: Secondary | ICD-10-CM | POA: Diagnosis not present

## 2021-10-22 DIAGNOSIS — M9902 Segmental and somatic dysfunction of thoracic region: Secondary | ICD-10-CM | POA: Diagnosis not present

## 2021-10-22 DIAGNOSIS — R293 Abnormal posture: Secondary | ICD-10-CM | POA: Diagnosis not present

## 2021-10-22 DIAGNOSIS — M62452 Contracture of muscle, left thigh: Secondary | ICD-10-CM | POA: Diagnosis not present

## 2021-10-22 DIAGNOSIS — M9903 Segmental and somatic dysfunction of lumbar region: Secondary | ICD-10-CM | POA: Diagnosis not present

## 2021-10-22 DIAGNOSIS — M62830 Muscle spasm of back: Secondary | ICD-10-CM | POA: Diagnosis not present

## 2021-11-02 ENCOUNTER — Ambulatory Visit: Payer: BC Managed Care – PPO | Admitting: Internal Medicine

## 2021-11-02 ENCOUNTER — Encounter: Payer: Self-pay | Admitting: Internal Medicine

## 2021-11-02 ENCOUNTER — Other Ambulatory Visit: Payer: Self-pay

## 2021-11-02 VITALS — BP 104/76 | HR 84 | Resp 16 | Ht 64.0 in | Wt 164.6 lb

## 2021-11-02 DIAGNOSIS — U071 COVID-19: Secondary | ICD-10-CM | POA: Diagnosis not present

## 2021-11-02 MED ORDER — PROMETHAZINE-DM 6.25-15 MG/5ML PO SYRP: 5.0000 mL | ORAL_SOLUTION | Freq: Four times a day (QID) | ORAL | 0 refills | Status: DC | PRN

## 2021-11-02 NOTE — Progress Notes (Signed)
HPI  Pt presents to the clinic today with c/o headache, runny nose, nasal congestion, ear fullness, sore throat, cough and shortness of breath.  She reports this started a week and a half ago.  She describes the headache as pressure.  She reports associated dizziness but denies visual changes.  She is blowing clear mucus out of her nose.  She denies difficulty swallowing.  The cough is mostly nonproductive.  She reports she ran a fever in the beginning but denies chills or body aches.  She has tried Mucinex and Robitussin OTC with minimal relief of symptoms.  She tested positive for COVID 1 week ago.  She has had 3 COVID vaccines.  Review of Systems      Past Medical History:  Diagnosis Date   Anxiety    Back pain    Depression    Hyperlipidemia     Family History  Problem Relation Age of Onset   Diabetes Mother    Bipolar disorder Mother    Anxiety disorder Mother    Depression Mother    Lung cancer Mother        lung   Anxiety disorder Sister    Depression Sister    Anxiety disorder Sister    Colon cancer Neg Hx    Breast cancer Neg Hx     Social History   Socioeconomic History   Marital status: Married    Spouse name: Not on file   Number of children: Not on file   Years of education: High School   Highest education level: High school graduate  Occupational History   Not on file  Tobacco Use   Smoking status: Every Day    Packs/day: 0.40    Years: 32.00    Pack years: 12.80    Types: Cigarettes   Smokeless tobacco: Never  Vaping Use   Vaping Use: Never used  Substance and Sexual Activity   Alcohol use: Not Currently   Drug use: Never   Sexual activity: Not on file  Other Topics Concern   Not on file  Social History Narrative   Not on file   Social Determinants of Health   Financial Resource Strain: Not on file  Food Insecurity: Not on file  Transportation Needs: Not on file  Physical Activity: Not on file  Stress: Not on file  Social Connections:  Not on file  Intimate Partner Violence: Not on file    Allergies  Allergen Reactions   Darvon [Propoxyphene] Nausea And Vomiting   Dilaudid [Hydromorphone] Nausea And Vomiting   Hydrocodone Nausea And Vomiting   Percocet [Oxycodone-Acetaminophen] Nausea And Vomiting     Constitutional: Positive headache, fatigue and fever. Denies abrupt weight changes.  HEENT:  Positive runny nose, nasal congestion, ear fullness and sore throat. Denies eye redness, eye pain, pressure behind the eyes, facial pain, ear pain, ringing in the ears, wax buildup, or bloody nose. Respiratory: Positive cough and shortness of breath. Denies difficulty breathing.  Cardiovascular: Denies chest pain, chest tightness, palpitations or swelling in the hands or feet.   No other specific complaints in a complete review of systems (except as listed in HPI above).  Objective:   BP 104/76 (BP Location: Right Arm, Patient Position: Sitting, Cuff Size: Normal)   Pulse 84   Resp 16   Ht 5\' 4"  (1.626 m)   Wt 164 lb 9.6 oz (74.7 kg)   SpO2 98%   BMI 28.25 kg/m   Wt Readings from Last 3 Encounters:  09/05/21  164 lb 3.2 oz (74.5 kg)  07/08/21 161 lb (73 kg)  03/06/21 162 lb 6.4 oz (73.7 kg)     General: Appears h her stated age, well developed, well nourished in NAD. HEENT: Head: normal shape and size, no sinus pressure noted; Eyes: sclera white, no icterus, conjunctiva pink; Ears: Tm's gray and intact, normal light reflex; COVID-19: Throat/Mouth: + PND. Teeth present, mucosa erythematous and moist, no exudate noted, no lesions or ulcerations noted.  Neck: No cervical lymphadenopathy.  Cardiovascular: Normal rate and rhythm. S1,S2 noted.  No murmur, rubs or gallops noted.  Pulmonary/Chest: Normal effort and positive vesicular breath sounds. No respiratory distress. No wheezes, rales or ronchi noted.       Assessment & Plan:   Upper Respiratory Infection:  Get some rest and drink plenty of water She is outside  the window for antiviral therapy Discussed Rx for Prednisone for symptom management but she declines at this time No indication for antibiotics at this time She declines Rx for inhaler Rx for Promethazine DM cough syrup  RTC as needed or if symptoms persist.   Nicki Reaper, NP This visit occurred during the SARS-CoV-2 public health emergency.  Safety protocols were in place, including screening questions prior to the visit, additional usage of staff PPE, and extensive cleaning of exam room while observing appropriate contact time as indicated for disinfecting solutions.

## 2021-11-02 NOTE — Patient Instructions (Signed)

## 2021-11-08 ENCOUNTER — Ambulatory Visit: Payer: Self-pay | Admitting: *Deleted

## 2021-11-08 NOTE — Telephone Encounter (Signed)
C/o severe back pain started yesterday . Pain from neck to ankles, difficulty getting out of bed. Requires physical help from husband to get out of bed. C/o she feels like a bad body ache. Denies N/T. Reports she is on day 13 of covid dx. Reports her therapist recommended for her to go to ED due to sudden back back and inability to get out of bed . Denies chest pain , difficulty breathing. Due to sx and inability to get out of bed with assist  go to ED. Patient requesting to hear recommendations from PCP before going to sit in ED . Patient requesting a call back from PCP today . Care advise given. Patient verbalized understanding of care advise and to go to ED but request call back from PCP first. [facility] notified of patient request.

## 2021-11-08 NOTE — Telephone Encounter (Signed)
Reason for Disposition  [1] SEVERE back pain (e.g., excruciating, unable to do any normal activities) AND [2] not improved 2 hours after pain medicine  Answer Assessment - Initial Assessment Questions 1. ONSET: "When did the pain begin?"      Yesterday morning  2. LOCATION: "Where does it hurt?" (upper, mid or lower back)     Upper back from neck to ankles 3. SEVERITY: "How bad is the pain?"  (e.g., Scale 1-10; mild, moderate, or severe)   - MILD (1-3): doesn't interfere with normal activities    - MODERATE (4-7): interferes with normal activities or awakens from sleep    - SEVERE (8-10): excruciating pain, unable to do any normal activities      Severe, needs hands on assistance  4. PATTERN: "Is the pain constant?" (e.g., yes, no; constant, intermittent)      Constant  5. RADIATION: "Does the pain shoot into your legs or elsewhere?"     na 6. CAUSE:  "What do you think is causing the back pain?"      Not sure  7. BACK OVERUSE:  "Any recent lifting of heavy objects, strenuous work or exercise?"     no 8. MEDICATIONS: "What have you taken so far for the pain?" (e.g., nothing, acetaminophen, NSAIDS)     Ibuprofen  9. NEUROLOGIC SYMPTOMS: "Do you have any weakness, numbness, or problems with bowel/bladder control?"     Weakness from neck to ankles  10. OTHER SYMPTOMS: "Do you have any other symptoms?" (e.g., fever, abdominal pain, burning with urination, blood in urine)       Denies  11. PREGNANCY: "Is there any chance you are pregnant?" (e.g., yes, no; LMP)       na  Protocols used: Back Pain-A-AH

## 2021-11-09 NOTE — Telephone Encounter (Signed)
This is a complex issue that would need more evaluation than I can provide at this time without appointment.  If she is very ill and sick and difficulty with function and getting out of bed, she should be evaluated at [facility] or ED promptly.  She is beyond a covid treatment window and we have not discussed this, I am not sure how else to help  Saralyn Pilar, DO [facility] 11/09/2021, 9:54 AM

## 2021-12-04 ENCOUNTER — Ambulatory Visit: Payer: Self-pay | Admitting: *Deleted

## 2021-12-04 NOTE — Telephone Encounter (Signed)
° °  Chief Complaint: left side of face pain Symptoms: left side face eye, jaw area pain 7-8 out of 10 pain. Blurred vision , double vision at times not now  Frequency: x 1 week  Pertinent Negatives: Patient denies difficulty breathing , dizziness, lightheadedness , tylenol ibuprofen not effective Disposition: [] ED /[x] Urgent Care (no appt availability in office) / [] Appointment(In office/virtual)/ []  [facility]/ [] Home Care/ [] Refused Recommended Disposition /[] [facility]/ []  Follow-up with PCP Additional Notes:          Reason for Disposition  Face pain present > 24 hours  Answer Assessment - Initial Assessment Questions 1. ONSET: "When did the pain start?" (e.g., minutes, hours, days)     X 1 week  2. ONSET: "Does the pain come and go, or has it been constant since it started?" (e.g., constant, intermittent, fleeting)     Constant  3. SEVERITY: "How bad is the pain?"   (Scale 1-10; mild, moderate or severe)   - MILD (1-3): doesn't interfere with normal activities    - MODERATE (4-7): interferes with normal activities or awakens from sleep    - SEVERE (8-10): excruciating pain, unable to do any normal activities      7/8  4. LOCATION: "Where does it hurt?"      Left side of face , eye,  5. RASH: "Is there any redness, rash, or swelling of the face?"     Na  6. FEVER: "Do you have a fever?" If Yes, ask: "What is it, how was it measured, and when did it start?"      na 7. OTHER SYMPTOMS: "Do you have any other symptoms?" (e.g., fever, toothache, nasal discharge, nasal congestion, clicking sensation in jaw joint)     "Pimple like " to left eyelid , runny nose  8. PREGNANCY: "Is there any chance you are pregnant?" "When was your last menstrual period?"     na  Protocols used: Face Pain-A-AH

## 2021-12-04 NOTE — Telephone Encounter (Signed)
Pt states went to UC, was told 3-4 hour wait, did not want to wait as "In so much pain I'd rather be home." States was told in UC she needed to be seen within 24 hours. Pt calling to be "Fit in." States "I don;t understand since they said that why I can't be seen." Angry affect. NT called practice, Apolonio Schneiders, for consult, call transferred.

## 2021-12-05 ENCOUNTER — Other Ambulatory Visit: Payer: Self-pay | Admitting: Family Medicine

## 2021-12-05 ENCOUNTER — Ambulatory Visit: Payer: BC Managed Care – PPO | Admitting: Family Medicine

## 2021-12-05 ENCOUNTER — Other Ambulatory Visit: Payer: Self-pay

## 2021-12-05 ENCOUNTER — Encounter: Payer: Self-pay | Admitting: Family Medicine

## 2021-12-05 VITALS — BP 101/71 | HR 84 | Ht 64.0 in | Wt 165.6 lb

## 2021-12-05 DIAGNOSIS — Z9889 Other specified postprocedural states: Secondary | ICD-10-CM | POA: Diagnosis not present

## 2021-12-05 DIAGNOSIS — R519 Headache, unspecified: Secondary | ICD-10-CM

## 2021-12-05 DIAGNOSIS — J0110 Acute frontal sinusitis, unspecified: Secondary | ICD-10-CM | POA: Diagnosis not present

## 2021-12-05 DIAGNOSIS — Z8603 Personal history of neoplasm of uncertain behavior: Secondary | ICD-10-CM | POA: Diagnosis not present

## 2021-12-05 MED ORDER — PREDNISONE 20 MG PO TABS: ORAL_TABLET | ORAL | 0 refills | Status: DC

## 2021-12-05 MED ORDER — LEVOFLOXACIN 500 MG PO TABS
500.0000 mg | ORAL_TABLET | Freq: Every day | ORAL | 0 refills | Status: DC
Start: 2021-12-05 — End: 2022-02-11

## 2021-12-05 NOTE — Progress Notes (Signed)
Subjective:    Patient ID: Amy ClarkCindy Corso, female    DOB: 03-02-1961, 61 y.o.   MRN: [id number]  Amy Hutchinson is a 61 y.o. female presenting on 12/05/2021 for Facial Pain   HPI  Left side facial pain Describes Left facial pain cheek and temple worse than other areas, but also radiates to ear, and to jaw and left ear region as well. Describes severe fairly constant pain 7-8 out of 10 for past 2 months approx, onset following COVID around Thanksgiving. - Feels similar to prior history of Meningioma that [place] - Admits since COVID has had congestion and cough at times. Sinus pressure pain as well. - She has allergy to PCN and cannot take augmentin. Admits occasional blurred vision and double vision but that is not new. Denies any focal neuro deficit, weakness tingling or loss of function slurred speech   [facility] 12/05/2021 09/05/2021 03/06/2021  Decreased Interest 0 0 0  Down, Depressed, Hopeless 0 0 0  PHQ - 2 Score 0 0 0  Altered sleeping 0 0 0  Tired, decreased energy 0 0 0  Change in appetite 0 0 0  Feeling bad or failure about yourself  0 0 0  Trouble concentrating 0 0 0  Moving slowly or fidgety/restless 0 0 0  Suicidal thoughts 0 0 0  PHQ-9 Score 0 0 0  Difficult doing work/chores Not difficult at all Not difficult at all Not difficult at all    Social History   Tobacco Use   Smoking status: Every Day    Packs/day: 0.40    Years: 32.00    Pack years: 12.80    Types: Cigarettes   Smokeless tobacco: Never  Vaping Use   Vaping Use: Never used  Substance Use Topics   Alcohol use: Not Currently   Drug use: Never    Review of Systems Per HPI unless specifically indicated above     Objective:    BP 101/71    Pulse 84    Ht 5\' 4"  (1.626 m)    Wt 165 lb 9.6 oz (75.1 kg)    SpO2 99%    BMI 28.43 kg/m   Wt Readings from Last 3 Encounters:  12/05/21 165 lb 9.6 oz (75.1 kg)  11/02/21 164 lb 9.6 oz (74.7  kg)  09/05/21 164 lb 3.2 oz (74.5 kg)    Physical Exam Vitals and nursing note reviewed.  Constitutional:      General: She is not in acute distress.    Appearance: She is well-developed. She is not diaphoretic.     Comments: Well-appearing, comfortable, cooperative  HENT:     Head: Normocephalic and atraumatic.     Comments: L jaw line TMJ area non tender, no abnormal movement or clicking or popping    Right Ear: Tympanic membrane, ear canal and external ear normal. There is no impacted cerumen.     Left Ear: Tympanic membrane, ear canal and external ear normal. There is no impacted cerumen.  Eyes:     General:        Right eye: No discharge.        Left eye: No discharge.     Conjunctiva/sclera: Conjunctivae normal.  Neck:     Thyroid: No thyromegaly.  Cardiovascular:     Rate and Rhythm: Normal rate and regular rhythm.     Heart sounds: Normal heart sounds. No murmur heard. Pulmonary:     Effort:  Pulmonary effort is normal. No respiratory distress.     Breath sounds: Normal breath sounds. No wheezing or rales.  Musculoskeletal:        General: Normal range of motion.     Cervical back: Normal range of motion and neck supple.  Lymphadenopathy:     Cervical: No cervical adenopathy.  Skin:    General: Skin is warm and dry.     Findings: No erythema or rash.  Neurological:     General: No focal deficit present.     Mental Status: She is alert and oriented to person, place, and time. Mental status is at baseline.     Cranial Nerves: No cranial nerve deficit.     Sensory: No sensory deficit.     Motor: No weakness.     Gait: Gait normal.  Psychiatric:        Behavior: Behavior normal.     Comments: Well groomed, good eye contact, normal speech and thoughts   Results for orders placed or performed in visit on 08/27/21  Hemoglobin A1c  Result Value Ref Range   Hgb A1c MFr Bld 5.8 (H) <5.7 % of total Hgb   Mean Plasma Glucose 120 mg/dL   eAG (mmol/L) 6.6 mmol/L  Lipid  panel  Result Value Ref Range   Cholesterol 150 <200 mg/dL   HDL 42 (L) > OR = 50 mg/dL   Triglycerides 284 (H) <150 mg/dL   LDL Cholesterol (Calc) 78 mg/dL (calc)   Total CHOL/HDL Ratio 3.6 <5.0 (calc)   Non-HDL Cholesterol (Calc) 108 <130 mg/dL (calc)      Assessment & Plan:   Problem List Items Addressed This Visit   None Visit Diagnoses     Left facial pain    -  Primary   Acute non-recurrent frontal sinusitis       Relevant Medications   levofloxacin (LEVAQUIN) 500 MG tablet   predniSONE (DELTASONE) 20 MG tablet       Left Facial Pain Uncertain exact etiology - ranges from cheek to temple, to jaw/ear region, fairly constant. Prior history meningioma s/p resection 2020 in [place] Has Neurosurgery locally but not seeing her for that issue they have only seen her for spine.  No new imaging of head.  Given onset following COVID recently, will empirically cover for sinusitis with antibiotic course levaquin and prednisone taper  Called Radiology  rad reading room and discussed w/ neuro rad Dr Chestine Spore, recommended MRI w and wo contrast for review surveillance on meningioma.  Ordered MRI Brain w and wo, to be scheduled.  Once reviewed we can refer back to [facility]surgery based on urgency of result.  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  Return criteria given when to go to hospital ED if indicated.   Meds ordered this encounter  Medications   levofloxacin (LEVAQUIN) 500 MG tablet    Sig: Take 1 tablet (500 mg total) by mouth daily. For 7 days    Dispense:  7 tablet    Refill:  0   predniSONE (DELTASONE) 20 MG tablet    Sig: Take daily with food. Start with 60mg  (3 pills) x 2 days, then reduce to 40mg  (2 pills) x 2 days, then 20mg  (1 pill) x 3 days    Dispense:  13 tablet    Refill:  0    Orders Placed This Encounter  Procedures   MR Brain W Wo  Contrast    Standing Status:   Future    Standing Expiration Date:   12/05/2022    Order Specific Question:   If indicated for the ordered procedure, I authorize the administration of contrast media per Radiology protocol    Answer:   Yes    Order Specific Question:   What is the patient's sedation requirement?    Answer:   No Sedation    Order Specific Question:   Does the patient have a pacemaker or implanted devices?    Answer:   No    Order Specific Question:   Preferred imaging location?    Answer:   [facility] (table limit - 550lbs)     Follow up plan: Return if symptoms worsen or fail to improve.   Saralyn Pilar, DO [facility] 12/05/2021, 2:16 PM

## 2021-12-05 NOTE — Patient Instructions (Addendum)
Thank you for coming to the office today  The facial pain is concerning for several different things.  We will treat for potential sinus infection after COVID - start treatment as follows  Start taking Levaquin antibiotic 500mg  daily x 7 days Start Prednisone taper 7 days  2. We will need to check with Neurosurgery for a Scan to see if there is anything else causing this. With your history of meningioma it is wise to check into it.  We can contact [facility] to ask if they can see you sooner for this issue, and ask about CT Head imaging. If they plan to see you soon and order it - they will call you. Otherwise we may try to order it.  Dr [facility]  [facility]   [address]   [place], [place]   [phone]      3. Less likely but consider TMJ flare up of the jaw - maybe good idea to check in with dentist to see if they can help with the jaw / facial pain.  [place] [facility] [phone] Dentistry [facility] [address] [place], [place] P: [phone]  [facility] [address] [place], [place] [phone]  [phone], DDS [address] [place], [place] [phone]  [facility] [address], [address] [place], [place] [phone]  [facility] [address] [place], [place] [phone]  [facility] [address] [place], [place] [phone]   Please schedule a Follow-up Appointment to: Return if symptoms worsen or fail to improve.  If you have any other questions or concerns, please feel free to call the office or send a message through MyChart. You may also schedule an earlier appointment if necessary.  Additionally, you may be receiving a survey about your experience at our office within a few days to 1 week by e-mail or mail. We value your  feedback.  [phone], DO [facility], [facility]

## 2021-12-13 ENCOUNTER — Telehealth: Payer: Self-pay | Admitting: Family Medicine

## 2021-12-13 NOTE — Telephone Encounter (Signed)
Patient called in states had to cancel her Mri appt because she can't afford her portion of 1561.00 Please call back

## 2021-12-13 NOTE — Telephone Encounter (Signed)
Called patient back.  Unfortunately her MRI Brain was scheduled for tomorrow 12/14/21. Now she has cancelled it.  Apt with Dr Myer Haff is 01/01/22.  Her symptoms are unchanged from our visit over a week ago. Prednisone did not help.  I advised that we could explore other options with lab testing, consider inflammatory markers however no improvement with steroid, seems less likely to be cause.  This is still an option if problem persists or worsens.  She may present to ED if worsening severe symptoms, but I am unsure they can do MRI.  I asked her to call Dr Lucienne Capers office back and notify them that she cannot afford the MRI and if they can offer other options for her for imaging or testing, and to keep apt if possible on 01/01/22.  Saralyn Pilar, DO [facility] 12/13/2021, 2:42 PM

## 2021-12-14 ENCOUNTER — Telehealth: Payer: Self-pay

## 2021-12-14 ENCOUNTER — Ambulatory Visit: Payer: BC Managed Care – PPO

## 2021-12-14 DIAGNOSIS — R519 Headache, unspecified: Secondary | ICD-10-CM

## 2021-12-14 DIAGNOSIS — F410 Panic disorder [episodic paroxysmal anxiety] without agoraphobia: Secondary | ICD-10-CM | POA: Diagnosis not present

## 2021-12-14 DIAGNOSIS — Z8603 Personal history of neoplasm of uncertain behavior: Secondary | ICD-10-CM

## 2021-12-14 DIAGNOSIS — F3340 Major depressive disorder, recurrent, in remission, unspecified: Secondary | ICD-10-CM | POA: Diagnosis not present

## 2021-12-14 DIAGNOSIS — F411 Generalized anxiety disorder: Secondary | ICD-10-CM | POA: Diagnosis not present

## 2021-12-14 DIAGNOSIS — Z9889 Other specified postprocedural states: Secondary | ICD-10-CM

## 2021-12-14 NOTE — Telephone Encounter (Signed)
Copied from [place] [id number]. Topic: General - Other >> Dec 14, 2021 11:12 AM Tessa Lerner A wrote: Reason for CRM: Patty with [facility] Neurosurgery has called regarding patient's referral for left facial pain  The patient can not have the MRI due to financial constraints   Cooper Render has recommended that the patient follow up with Neurology (dept and location unspecified)   The patient has been made aware of [facility]'s recommendations   Please contact further if needed

## 2021-12-14 NOTE — Telephone Encounter (Signed)
Okay I have placed new referral to [facility] Neurology as they requested. Since she cannot cover cost of MRI Brain, Neurosurgery is requested this instead.  Nobie Putnam, [facility] 12/14/2021, 5:23 PM

## 2021-12-23 IMAGING — MR MR LUMBAR SPINE W/O CM
5 series · 31 of 48 positions shown · non-contrast
Comparison: None.

CLINICAL DATA: Lower back pain

EXAM:
MRI LUMBAR SPINE WITHOUT CONTRAST
TECHNIQUE: Multiplanar, multisequence MR imaging of the lumbar spine was
performed. No intravenous contrast was administered.

[Series 5: T2 · sagittal · 4.0mm · 0.81mm/px · 6 of 17 slices shown (1 of 2)]
[im 1/17]
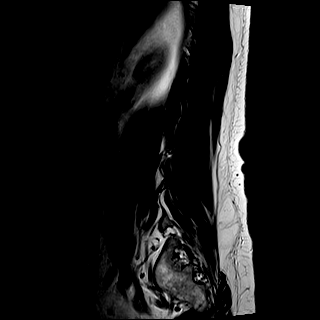
[im 4/17]
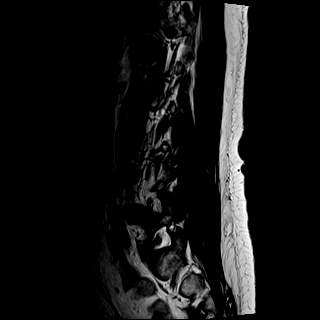
[im 7/17]
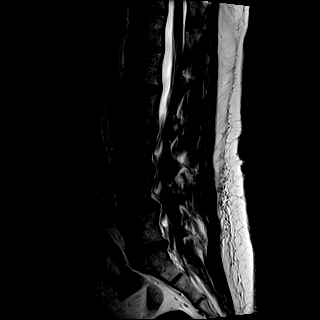
[im 10/17]
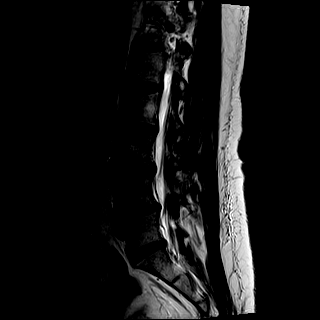
[im 13/17]
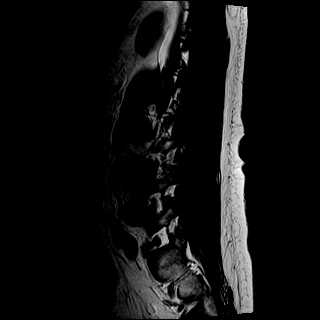
[im 17/17]
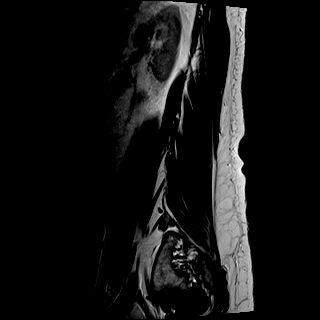

[Series 6: T1 · sagittal · 4.0mm · 0.81mm/px · 7 of 17 slices shown (1 of 2)]
[im 1/17]
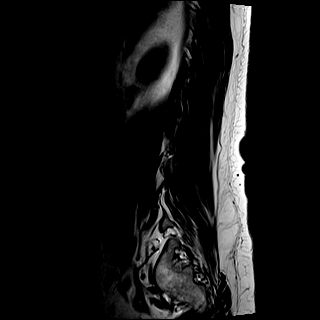
[im 3/17]
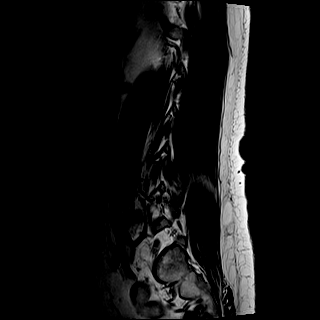
[im 6/17]
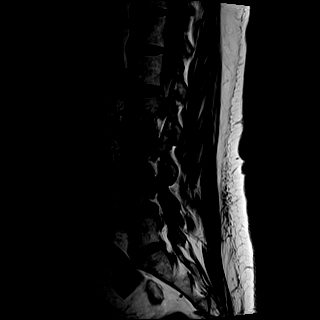
[im 9/17]
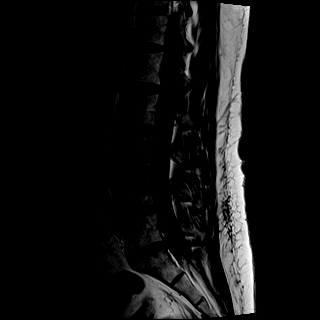
[im 11/17]
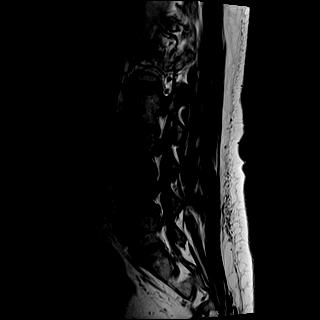
[im 14/17]
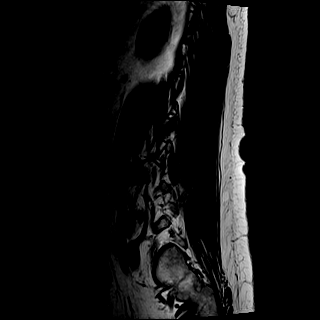
[im 17/17]
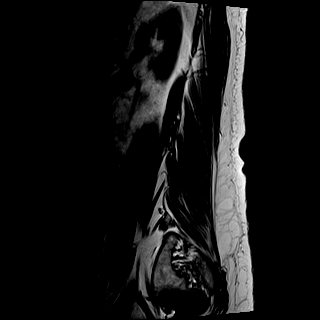

[Series 7: STIR · sagittal · 4.0mm · 0.41mm/px · 2 of 17 slices shown]
[im 1/17]
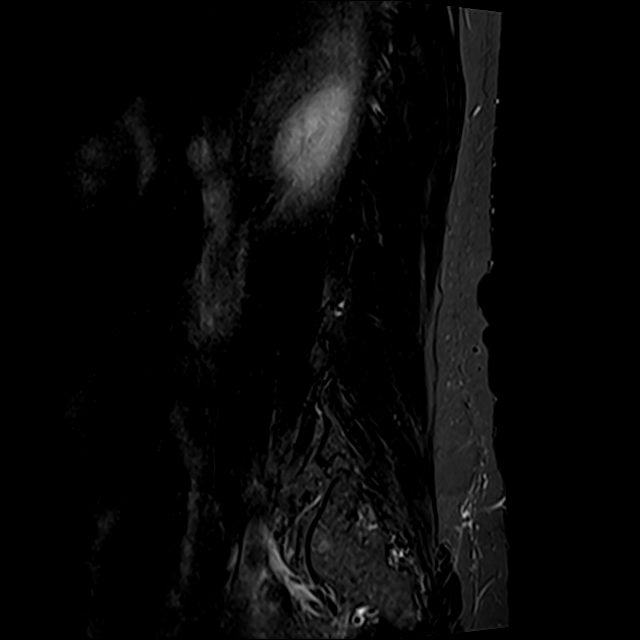
[im 3/17]
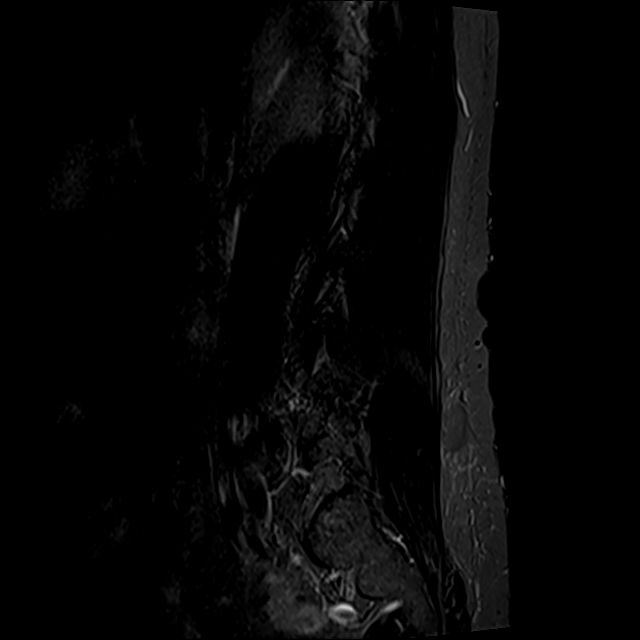

[Series 8: T2 · axial · 4.0mm · 0.78mm/px · z∈[-101,+99]mm · 8 of 35 slices shown (2 of 2)]
[im 1/35]
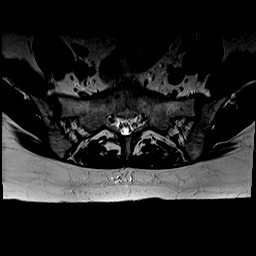
[im 6/35]
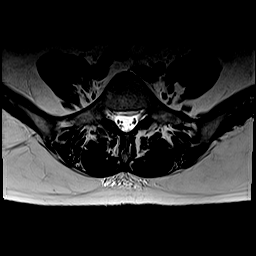
[im 11/35]
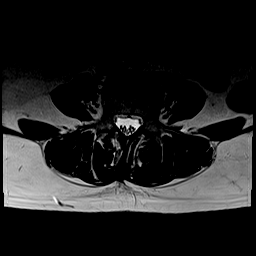
[im 16/35]
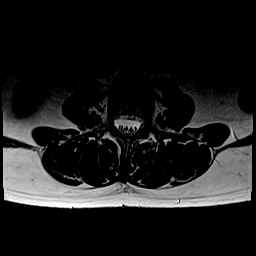
[im 19/35]
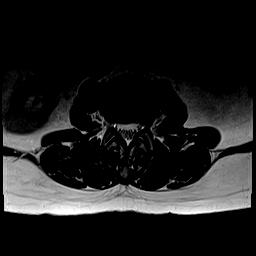
[im 24/35]
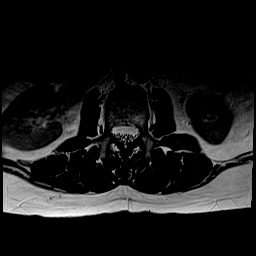
[im 29/35]
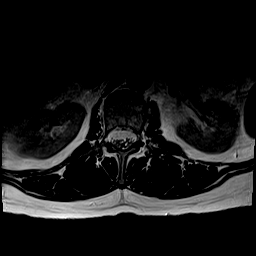
[im 35/35]
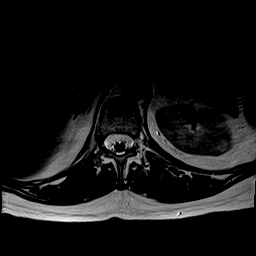

[Series 9: T1 · axial · 4.0mm · 0.39mm/px · z∈[-101,+99]mm · 8 of 35 slices shown (2 of 2)]
[im 1/35]
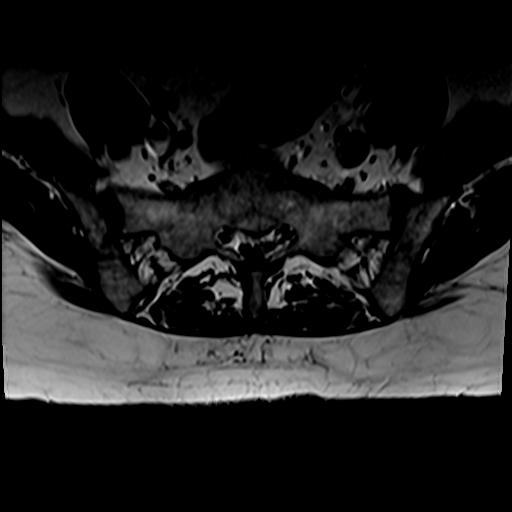
[im 6/35]
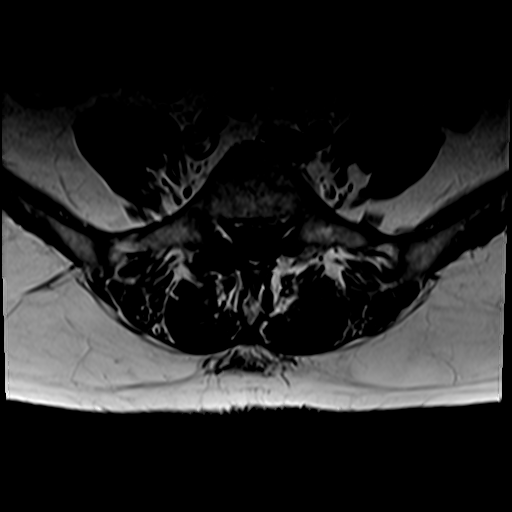
[im 11/35]
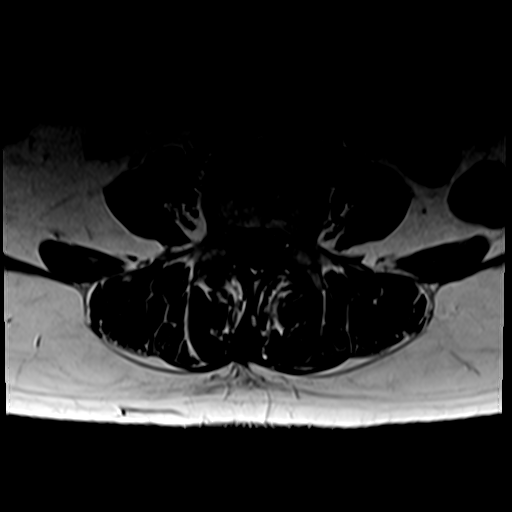
[im 16/35]
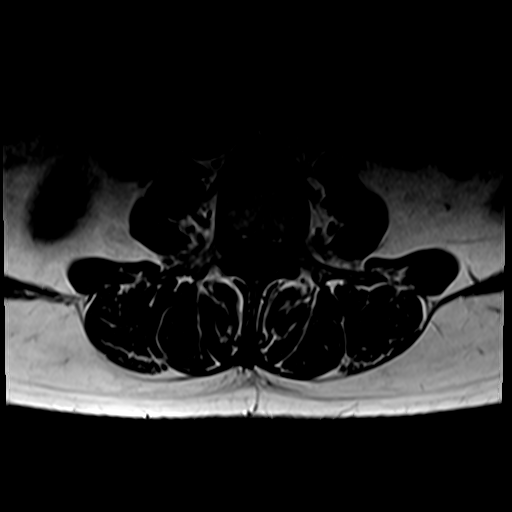
[im 19/35]
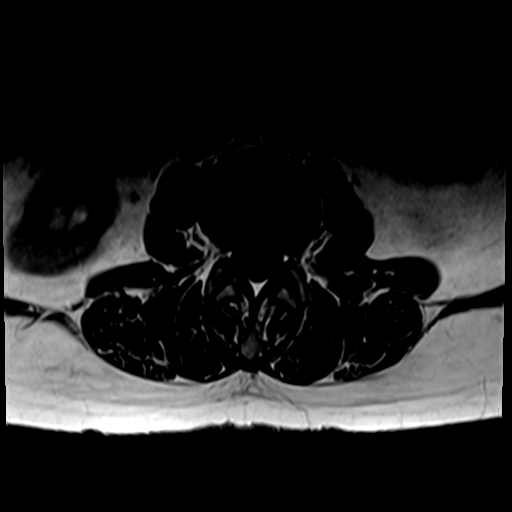
[im 24/35]
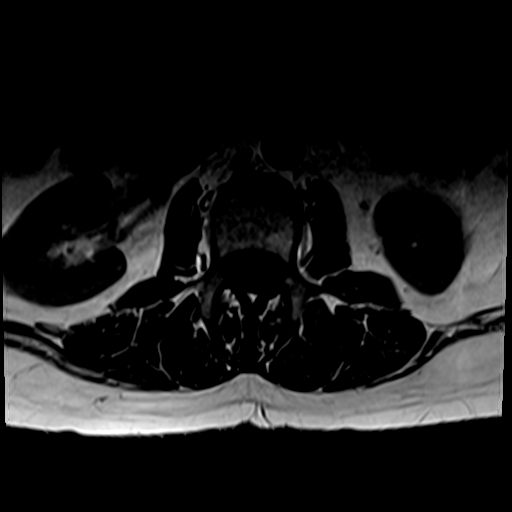
[im 29/35]
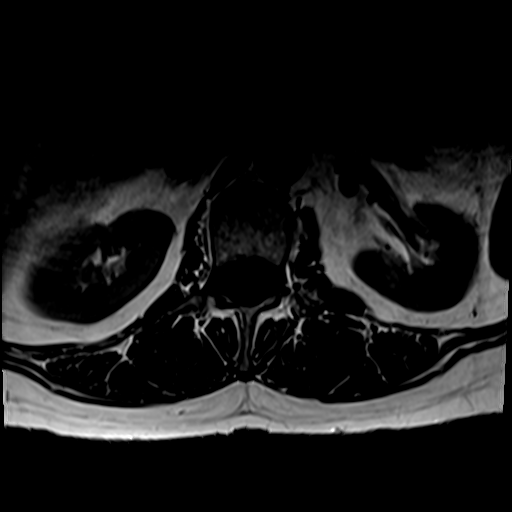
[im 35/35]
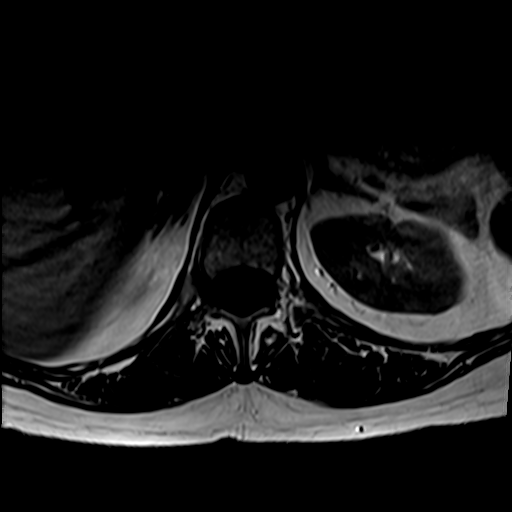

[31 of 48 positions shown; findings below may reference images not displayed]

FINDINGS: Segmentation: For the purposes of this dictation, the most
well-formed inferior-most disc space is labeled as L5-S1.

Alignment:  Trace anterolisthesis at L4-L5.

Vertebrae: No fracture, evidence of discitis, or aggressive bone
lesion. There is a vertebral body hemangioma along the posterior
aspect of L2. Likely vertebral body hemangioma at T12 as well.

Conus medullaris and cauda equina: Conus extends to the L1 level.
Conus and cauda equina appear normal.

Paraspinal and other soft tissues: Small left renal cyst which is
simple appearing.

Disc levels:

T12-L1: No significant spinal canal or neural foraminal narrowing.

L1-L2: Minimal disc bulging. No significant spinal canal or neural
foraminal narrowing

L2-L3: Central disc bulging results in mild spinal canal stenosis.
No significant neural foraminal narrowing.

L3-L4: Broad-based disc bulging results in mild spinal canal
stenosis, and mild left neural foraminal narrowing.

L4-L5: Trace anterolisthesis with broad-based disc bulging,
asymmetric to the left, ligamentum flavum hypertrophy, and facet
arthropathy results in moderate left neural foraminal narrowing and
mild spinal canal stenosis. Perifacet bony edema on the right.

L5-S1: Broad-based disc bulging results in no significant spinal
canal or neural foraminal narrowing.
IMPRESSION: Multilevel degenerative changes of the lumbar spine, with mild
spinal canal stenosis from L2 through L5.

Moderate left neural foraminal narrowing at L4-L5 due to broad-based
disc bulging and facet arthropathy. Trace anterolisthesis at this
level.

Periarticular bony edema at the right L4-L5 facet which may be an
additional source of pain.

Mild left neural foraminal narrowing at L3-L4.

## 2022-01-03 ENCOUNTER — Ambulatory Visit: Payer: Self-pay | Admitting: *Deleted

## 2022-01-03 NOTE — Telephone Encounter (Signed)
The patient has experienced lightheadedness and dizziness frequently with the latest one being today   The patient shares that they feel like they're about the pass out at times   Please contact the patient to discuss further   Chief Complaint: Lightheadedness Symptoms: "Floaty feeling" Frequency: 2 months ago, happening more frequently. Pertinent Negatives: Patient denies weakness, nausea, visual changes. "Sometimes I drink orange juice and it goes away. Meclizine helps a bit."  Disposition: [] ED /[] Urgent Care (no appt availability in office) / [x] Appointment(In office/virtual)/ []  [facility]/ [] Home Care/ [] Refused Recommended Disposition /[] [place] [facility]/ []  Follow-up with PCP Additional Notes: Agent secured appt prior to triage. Advised ED for worsening symptoms. Reason for Disposition  [1] MODERATE dizziness (e.g., interferes with normal activities) AND [2] has been evaluated by physician for this  Answer Assessment - Initial Assessment Questions 1. DESCRIPTION: "Describe your dizziness."     Lightheaded 2. LIGHTHEADED: "Do you feel lightheaded?" (e.g., somewhat faint, woozy, weak upon standing)     Yes 3. VERTIGO: "Do you feel like either you or the room is spinning or tilting?" (i.e. vertigo)     no 4. SEVERITY: "How bad is it?"  "Do you feel like you are going to faint?" "Can you stand and walk?"   - MILD: Feels slightly dizzy, but walking normally.   - MODERATE: Feels unsteady when walking, but not falling; interferes with normal activities (e.g., school, work).   - SEVERE: Unable to walk without falling, or requires assistance to walk without falling; feels like passing out now.      Depends, varies 5. ONSET:  "When did the dizziness begin?"     2 months ago. Worsening 6. AGGRAVATING FACTORS: "Does anything make it worse?" (e.g., standing, change in head position)     At times. 7. HEART RATE: "Can you tell me your heart rate?" "How many beats in 15  seconds?"  (Note: not all patients can do this)        8. CAUSE: "What do you think is causing the dizziness?"     Unsure 9. RECURRENT SYMPTOM: "Have you had dizziness before?" If Yes, ask: "When was the last time?" "What happened that time?"     Ongoing 2 months 10. OTHER SYMPTOMS: "Do you have any other symptoms?" (e.g., fever, chest pain, vomiting, diarrhea, bleeding)       No  Protocols used: Dizziness - Lightheadedness-A-AH

## 2022-01-04 ENCOUNTER — Ambulatory Visit: Payer: BC Managed Care – PPO | Admitting: Family Medicine

## 2022-01-04 ENCOUNTER — Other Ambulatory Visit: Payer: Self-pay

## 2022-01-04 ENCOUNTER — Encounter: Payer: Self-pay | Admitting: Family Medicine

## 2022-01-04 VITALS — BP 116/70 | HR 86 | Ht 64.0 in | Wt 167.6 lb

## 2022-01-04 DIAGNOSIS — Z8603 Personal history of neoplasm of uncertain behavior: Secondary | ICD-10-CM | POA: Diagnosis not present

## 2022-01-04 DIAGNOSIS — R519 Headache, unspecified: Secondary | ICD-10-CM | POA: Diagnosis not present

## 2022-01-04 DIAGNOSIS — H5462 Unqualified visual loss, left eye, normal vision right eye: Secondary | ICD-10-CM

## 2022-01-04 DIAGNOSIS — Z9889 Other specified postprocedural states: Secondary | ICD-10-CM | POA: Diagnosis not present

## 2022-01-04 NOTE — Patient Instructions (Addendum)
Thank you for coming to the office today.  We are calling [name] to get you in ASAP Monday at 945 am Dr Myrtie Cruise today STAT will let you know results end of day or first thing next week  If not improving or worsening, need to go to hospital ED if worsening.  Please schedule a Follow-up Appointment to: Return if symptoms worsen or fail to improve.  If you have any other questions or concerns, please feel free to call the office or send a message through MyChart. You may also schedule an earlier appointment if necessary.  Additionally, you may be receiving a survey about your experience at our office within a few days to 1 week by e-mail or mail. We value your feedback.  Amy Pilar, DO [facility], [place]

## 2022-01-04 NOTE — Progress Notes (Signed)
Subjective:    Patient ID: Amy Hutchinson, female    DOB: 1961/03/04, 61 y.o.   MRN: [id number]  Amy Hutchinson is a 61 y.o. female presenting on 01/04/2022 for Facial Pain and Dizziness   HPI  Headache Left side facial pain Dizziness Episodic reduced vision L side  Follow-up from last visit 12/05/21 - she had symptoms for 2 months prior to presentation, she was originally referred back to Neurosurgery and for MRI, but MRI too costly, and she could not be seen by Neurosurgery, they switched her to Neurology consult, that apt is scheduled for 01/21/22.  In meantime, she has been given course of antibiotics and steroids prednisone for sinus symptoms as underlying possible etiology, has not resolved her symptoms or pain or headache.  Background history - she says feels similar to prior history of Meningioma that required brain surgery back in 2020, in [place] saw Neurosurgery  Admits since [place] has had congestion and cough at times. Sinus pressure pain as well.  Previously more blurred vision, now change with Now worsening has a "dimming" of the vision, not just blurry vision like last visit. Feels like her vision in Left eye gets dark and light again. Does not lose vision.  Denies any focal neuro deficit, weakness tingling or loss of function slurred speech  Depression screen [facility] 2/9 12/05/2021 09/05/2021 03/06/2021  Decreased Interest 0 0 0  Down, Depressed, Hopeless 0 0 0  PHQ - 2 Score 0 0 0  Altered sleeping 0 0 0  Tired, decreased energy 0 0 0  Change in appetite 0 0 0  Feeling bad or failure about yourself  0 0 0  Trouble concentrating 0 0 0  Moving slowly or fidgety/restless 0 0 0  Suicidal thoughts 0 0 0  PHQ-9 Score 0 0 0  Difficult doing work/chores Not difficult at all Not difficult at all Not difficult at all    Social History   Tobacco Use   Smoking status: Every Day    Packs/day: 0.40    Years: 32.00    Pack years: 12.80    Types: Cigarettes   Smokeless tobacco: Never   Vaping Use   Vaping Use: Never used  Substance Use Topics   Alcohol use: Not Currently   Drug use: Never    Review of Systems Per HPI unless specifically indicated above     Objective:    BP 116/70    Pulse 86    Ht '5\' 4"'  (1.626 m)    Wt 167 lb 9.6 oz (76 kg)    SpO2 98%    BMI 28.77 kg/m   Wt Readings from Last 3 Encounters:  01/04/22 167 lb 9.6 oz (76 kg)  12/05/21 165 lb 9.6 oz (75.1 kg)  11/02/21 164 lb 9.6 oz (74.7 kg)    Physical Exam Vitals and nursing note reviewed.  Constitutional:      General: She is not in acute distress.    Appearance: She is well-developed. She is not diaphoretic.     Comments: Well-appearing, comfortable, cooperative  HENT:     Head: Normocephalic and atraumatic.  Eyes:     General:        Right eye: No discharge.        Left eye: No discharge.     Extraocular Movements: Extraocular movements intact.     Conjunctiva/sclera: Conjunctivae normal.     Pupils: Pupils are equal, round, and reactive to light.     Comments: Left fundoscopic exam attempted  in office, without eyes dilated. Visualized portion of fundus as tolerated by patient, no obvious pathology identified. Only view of partial fundus capable in visit today.  Neck:     Thyroid: No thyromegaly.  Cardiovascular:     Rate and Rhythm: Normal rate and regular rhythm.     Heart sounds: Normal heart sounds. No murmur heard. Pulmonary:     Effort: Pulmonary effort is normal. No respiratory distress.     Breath sounds: Normal breath sounds. No wheezing or rales.  Musculoskeletal:        General: Normal range of motion.     Cervical back: Normal range of motion and neck supple.  Lymphadenopathy:     Cervical: No cervical adenopathy.  Skin:    General: Skin is warm and dry.     Findings: No erythema or rash.  Neurological:     Mental Status: She is alert and oriented to person, place, and time.  Psychiatric:        Behavior: Behavior normal.     Comments: Well groomed, good eye  contact, normal speech and thoughts   Results for orders placed or performed in visit on 01/04/22  Sed Rate (ESR)  Result Value Ref Range   Sed Rate 2 0 - 30 mm/h  COMPLETE METABOLIC PANEL WITH GFR  Result Value Ref Range   Glucose, Bld 91 65 - 99 mg/dL   BUN 12 7 - 25 mg/dL   Creat 0.92 0.50 - 1.03 mg/dL   eGFR 72 > OR = 60 mL/min/1.62m   BUN/Creatinine Ratio NOT APPLICABLE 6 - 22 (calc)   Sodium 140 135 - 146 mmol/L   Potassium 4.3 3.5 - 5.3 mmol/L   Chloride 106 98 - 110 mmol/L   CO2 27 20 - 32 mmol/L   Calcium 10.1 8.6 - 10.4 mg/dL   Total Protein 7.1 6.1 - 8.1 g/dL   Albumin 4.7 3.6 - 5.1 g/dL   Globulin 2.4 1.9 - 3.7 g/dL (calc)   AG Ratio 2.0 1.0 - 2.5 (calc)   Total Bilirubin 0.5 0.2 - 1.2 mg/dL   Alkaline phosphatase (APISO) 87 37 - 153 U/L   AST 14 10 - 35 U/L   ALT 13 6 - 29 U/L  CBC with Differential/Platelet  Result Value Ref Range   WBC 11.1 (H) 3.8 - 10.8 Thousand/uL   RBC 5.31 (H) 3.80 - 5.10 Million/uL   Hemoglobin 15.0 11.7 - 15.5 g/dL   HCT 45.6 (H) 35.0 - 45.0 %   MCV 85.9 80.0 - 100.0 fL   MCH 28.2 27.0 - 33.0 pg   MCHC 32.9 32.0 - 36.0 g/dL   RDW 12.9 11.0 - 15.0 %   Platelets 331 140 - 400 Thousand/uL   MPV 9.8 7.5 - 12.5 fL   Neutro Abs 6,272 1,500 - 7,800 cells/uL   Lymphs Abs 4,251 (H) 850 - 3,900 cells/uL   Absolute Monocytes 400 200 - 950 cells/uL   Eosinophils Absolute 111 15 - 500 cells/uL   Basophils Absolute 67 0 - 200 cells/uL   Neutrophils Relative % 56.5 %   Total Lymphocyte 38.3 %   Monocytes Relative 3.6 %   Eosinophils Relative 1.0 %   Basophils Relative 0.6 %      Assessment & Plan:   Problem List Items Addressed This Visit     History of resection of meningioma   Other Visit Diagnoses     Left facial pain    -  Primary   Relevant Orders  Sed Rate (ESR) (Completed)   C-reactive protein   COMPLETE METABOLIC PANEL WITH GFR (Completed)   CBC with Differential/Platelet (Completed)   Vision loss of left eye        Relevant Orders   Sed Rate (ESR) (Completed)   C-reactive protein   COMPLETE METABOLIC PANEL WITH GFR (Completed)   CBC with Differential/Platelet (Completed)       Remains uncertain exact etiology Given persistence vs worsening symptoms now with L eye intermittent vision loss, concern raised for possible temporal arteritis, however she has already tried steroid course without relief. - Will add STAT Labs today for ESR CRP and chemistry / CBC  Since unable to pursue advanced imaging due to cost, limited options.  I advised her that she will need Ophthal eval, she has been to [facility] in the past, but not recently. Our office called them today and scheduled her with a Dr Kennith Gain on Monday 2/6 at 945am.  I encouraged her to seek care immediately at hospital ED if symptoms not improving or worsening, she may benefit from further prompt eval with advanced neuroimaging.  Keep follow up with outpatient Neuro [facility] 01/21/22.  Update 645pm on 01/04/22  ESR 2 (Negative) CRP pending CBC and CMET unremarkable for this current presentation. Mild elevated WBC only.  Will route lab results for patient to be notified by Monday AM once all resulted.  No orders of the defined types were placed in this encounter.     Follow up plan: Return if symptoms worsen or fail to improve.   Nobie Putnam, DO [facility] 01/04/2022, 2:08 PM

## 2022-01-05 LAB — COMPLETE METABOLIC PANEL WITH GFR
AG Ratio: 2 (calc) (ref 1.0–2.5)
ALT: 13 U/L (ref 6–29)
AST: 14 U/L (ref 10–35)
Albumin: 4.7 g/dL (ref 3.6–5.1)
Alkaline phosphatase (APISO): 87 U/L (ref 37–153)
BUN: 12 mg/dL (ref 7–25)
CO2: 27 mmol/L (ref 20–32)
Calcium: 10.1 mg/dL (ref 8.6–10.4)
Chloride: 106 mmol/L (ref 98–110)
Creat: 0.92 mg/dL (ref 0.50–1.03)
Globulin: 2.4 g/dL (calc) (ref 1.9–3.7)
Glucose, Bld: 91 mg/dL (ref 65–99)
Potassium: 4.3 mmol/L (ref 3.5–5.3)
Sodium: 140 mmol/L (ref 135–146)
Total Bilirubin: 0.5 mg/dL (ref 0.2–1.2)
Total Protein: 7.1 g/dL (ref 6.1–8.1)
eGFR: 72 mL/min/{1.73_m2} (ref >=60)

## 2022-01-05 LAB — CBC WITH DIFFERENTIAL/PLATELET
Absolute Monocytes: 400 cells/uL (ref 200–950)
Basophils Absolute: 67 cells/uL (ref 0–200)
Basophils Relative: 0.6 %
Eosinophils Absolute: 111 cells/uL (ref 15–500)
Eosinophils Relative: 1 %
HCT: 45.6 % — ABNORMAL HIGH (ref 35.0–45.0)
Hemoglobin: 15 g/dL (ref 11.7–15.5)
Lymphs Abs: 4251 cells/uL — ABNORMAL HIGH (ref 850–3900)
MCH: 28.2 pg (ref 27.0–33.0)
MCHC: 32.9 g/dL (ref 32.0–36.0)
MCV: 85.9 fL (ref 80.0–100.0)
MPV: 9.8 fL (ref 7.5–12.5)
Monocytes Relative: 3.6 %
Neutro Abs: 6272 cells/uL (ref 1500–7800)
Neutrophils Relative %: 56.5 %
Platelets: 331 10*3/uL (ref 140–400)
RBC: 5.31 10*6/uL — ABNORMAL HIGH (ref 3.80–5.10)
RDW: 12.9 % (ref 11.0–15.0)
Total Lymphocyte: 38.3 %
WBC: 11.1 10*3/uL — ABNORMAL HIGH (ref 3.8–10.8)

## 2022-01-05 LAB — C-REACTIVE PROTEIN: CRP: 2.8 mg/L (ref <8.0)

## 2022-01-05 LAB — SEDIMENTATION RATE: Sed Rate: 2 mm/h (ref 0–30)

## 2022-01-07 DIAGNOSIS — H524 Presbyopia: Secondary | ICD-10-CM | POA: Diagnosis not present

## 2022-01-07 DIAGNOSIS — R519 Headache, unspecified: Secondary | ICD-10-CM | POA: Diagnosis not present

## 2022-01-21 DIAGNOSIS — R42 Dizziness and giddiness: Secondary | ICD-10-CM | POA: Diagnosis not present

## 2022-01-21 DIAGNOSIS — Z7689 Persons encountering health services in other specified circumstances: Secondary | ICD-10-CM | POA: Diagnosis not present

## 2022-01-21 DIAGNOSIS — R519 Headache, unspecified: Secondary | ICD-10-CM | POA: Diagnosis not present

## 2022-01-21 DIAGNOSIS — H9202 Otalgia, left ear: Secondary | ICD-10-CM | POA: Diagnosis not present

## 2022-02-11 ENCOUNTER — Ambulatory Visit: Payer: Self-pay | Admitting: *Deleted

## 2022-02-11 ENCOUNTER — Other Ambulatory Visit: Payer: Self-pay

## 2022-02-11 ENCOUNTER — Ambulatory Visit: Payer: BC Managed Care – PPO | Admitting: Family Medicine

## 2022-02-11 ENCOUNTER — Encounter: Payer: Self-pay | Admitting: Family Medicine

## 2022-02-11 VITALS — BP 103/67 | HR 85 | Ht 64.0 in | Wt 168.6 lb

## 2022-02-11 DIAGNOSIS — J0110 Acute frontal sinusitis, unspecified: Secondary | ICD-10-CM | POA: Diagnosis not present

## 2022-02-11 DIAGNOSIS — R42 Dizziness and giddiness: Secondary | ICD-10-CM

## 2022-02-11 DIAGNOSIS — H8112 Benign paroxysmal vertigo, left ear: Secondary | ICD-10-CM | POA: Diagnosis not present

## 2022-02-11 MED ORDER — AMOXICILLIN-POT CLAVULANATE 400-57 MG/5ML PO SUSR: 875.0000 mg | Freq: Two times a day (BID) | ORAL | 0 refills | Status: DC

## 2022-02-11 MED ORDER — MECLIZINE HCL 25 MG PO TABS: 25.0000 mg | ORAL_TABLET | Freq: Three times a day (TID) | ORAL | 2 refills | Status: DC | PRN

## 2022-02-11 NOTE — Telephone Encounter (Signed)
Reason for Disposition ? [1] MODERATE dizziness (e.g., interferes with normal activities) AND [2] has been evaluated by physician for this ? ?Answer Assessment - Initial Assessment Questions ?1. DESCRIPTION: "Describe your dizziness." ?    Floating dizziness, spinning dizziness- unable to drive ?2. LIGHTHEADED: "Do you feel lightheaded?" (e.g., somewhat faint, woozy, weak upon standing) ?    Faint, weak, nausea ?3. VERTIGO: "Do you feel like either you or the room is spinning or tilting?" (i.e. vertigo) ?    Yes- L temple and ear ?4. SEVERITY: "How bad is it?"  "Do you feel like you are going to faint?" "Can you stand and walk?" ?  - MILD: Feels slightly dizzy, but walking normally. ?  - MODERATE: Feels unsteady when walking, but not falling; interferes with normal activities (e.g., school, work). ?  - SEVERE: Unable to walk without falling, or requires assistance to walk without falling; feels like passing out now.  ?    moderate ?5. ONSET:  "When did the dizziness begin?" ?    Ongoing- over 1 month ?6. AGGRAVATING FACTORS: "Does anything make it worse?" (e.g., standing, change in head position) ?    Medication not helping, there at waking and continues through the day ?7. HEART RATE: "Can you tell me your heart rate?" "How many beats in 15 seconds?"  (Note: not all patients can do this)   ?      ?8. CAUSE: "What do you think is causing the dizziness?" ?    Not sure ?9. RECURRENT SYMPTOM: "Have you had dizziness before?" If Yes, ask: "When was the last time?" "What happened that time?" ?    no ?10. OTHER SYMPTOMS: "Do you have any other symptoms?" (e.g., fever, chest pain, vomiting, diarrhea, bleeding) ?      no ?11. PREGNANCY: "Is there any chance you are pregnant?" "When was your last menstrual period?" ?      *No Answer* ? ?Protocols used: Dizziness - Lightheadedness-A-AH ? ?

## 2022-02-11 NOTE — Progress Notes (Unsigned)
Subjective:    Patient ID: Amy Hutchinson, female    DOB: [birth year], 61 y.o.   MRN: [id number]  Amy Hutchinson is a 61 y.o. female presenting on 02/11/2022 for Dizziness   HPI  Left Inner Ear / Vertigo ***Previously about 2-4 years ago she was going to possibly have tubes placed in ears -    Health Maintenance: ***  Depression screen [facility] 2/9 12/05/2021 09/05/2021 03/06/2021  Decreased Interest 0 0 0  Down, Depressed, Hopeless 0 0 0  PHQ - 2 Score 0 0 0  Altered sleeping 0 0 0  Tired, decreased energy 0 0 0  Change in appetite 0 0 0  Feeling bad or failure about yourself  0 0 0  Trouble concentrating 0 0 0  Moving slowly or fidgety/restless 0 0 0  Suicidal thoughts 0 0 0  PHQ-9 Score 0 0 0  Difficult doing work/chores Not difficult at all Not difficult at all Not difficult at all    Social History   Tobacco Use   Smoking status: Every Day    Packs/day: 0.40    Years: 32.00    Pack years: 12.80    Types: Cigarettes   Smokeless tobacco: Never  Vaping Use   Vaping Use: Never used  Substance Use Topics   Alcohol use: Not Currently   Drug use: Never    Review of Systems Per HPI unless specifically indicated above     Objective:    BP 103/67    Pulse 85    Ht '5\' 4"'  (1.626 m)    Wt 168 lb 9.6 oz (76.5 kg)    SpO2 95%    BMI 28.94 kg/m   Wt Readings from Last 3 Encounters:  02/11/22 168 lb 9.6 oz (76.5 kg)  01/04/22 167 lb 9.6 oz (76 kg)  12/05/21 165 lb 9.6 oz (75.1 kg)    Physical Exam Results for orders placed or performed in visit on 01/04/22  Sed Rate (ESR)  Result Value Ref Range   Sed Rate 2 0 - 30 mm/h  C-reactive protein  Result Value Ref Range   CRP 2.8 <8.0 mg/L  COMPLETE METABOLIC PANEL WITH GFR  Result Value Ref Range   Glucose, Bld 91 65 - 99 mg/dL   BUN 12 7 - 25 mg/dL   Creat 0.92 0.50 - 1.03 mg/dL   eGFR 72 > OR = 60 mL/min/1.90m   BUN/Creatinine Ratio NOT APPLICABLE 6 - 22 (calc)   Sodium 140 135 - 146 mmol/L   Potassium 4.3 3.5 - 5.3  mmol/L   Chloride 106 98 - 110 mmol/L   CO2 27 20 - 32 mmol/L   Calcium 10.1 8.6 - 10.4 mg/dL   Total Protein 7.1 6.1 - 8.1 g/dL   Albumin 4.7 3.6 - 5.1 g/dL   Globulin 2.4 1.9 - 3.7 g/dL (calc)   AG Ratio 2.0 1.0 - 2.5 (calc)   Total Bilirubin 0.5 0.2 - 1.2 mg/dL   Alkaline phosphatase (APISO) 87 37 - 153 U/L   AST 14 10 - 35 U/L   ALT 13 6 - 29 U/L  CBC with Differential/Platelet  Result Value Ref Range   WBC 11.1 (H) 3.8 - 10.8 Thousand/uL   RBC 5.31 (H) 3.80 - 5.10 Million/uL   Hemoglobin 15.0 11.7 - 15.5 g/dL   HCT 45.6 (H) 35.0 - 45.0 %   MCV 85.9 80.0 - 100.0 fL   MCH 28.2 27.0 - 33.0 pg   MCHC 32.9 32.0 - 36.0  g/dL   RDW 12.9 11.0 - 15.0 %   Platelets 331 140 - 400 Thousand/uL   MPV 9.8 7.5 - 12.5 fL   Neutro Abs 6,272 1,500 - 7,800 cells/uL   Lymphs Abs 4,251 (H) 850 - 3,900 cells/uL   Absolute Monocytes 400 200 - 950 cells/uL   Eosinophils Absolute 111 15 - 500 cells/uL   Basophils Absolute 67 0 - 200 cells/uL   Neutrophils Relative % 56.5 %   Total Lymphocyte 38.3 %   Monocytes Relative 3.6 %   Eosinophils Relative 1.0 %   Basophils Relative 0.6 %      Assessment & Plan:   Problem List Items Addressed This Visit   None Visit Diagnoses     Vertigo    -  Primary   Relevant Orders   Ambulatory referral to ENT   Dizziness       Relevant Medications   meclizine (ANTIVERT) 25 MG tablet   BPPV (benign paroxysmal positional vertigo), left       Acute non-recurrent frontal sinusitis       Relevant Medications   amoxicillin-clavulanate (AUGMENTIN) 400-57 MG/5ML suspension       Meds ordered this encounter  Medications   meclizine (ANTIVERT) 25 MG tablet    Sig: Take 1 tablet (25 mg total) by mouth 3 (three) times daily as needed for dizziness.    Dispense:  90 tablet    Refill:  2   amoxicillin-clavulanate (AUGMENTIN) 400-57 MG/5ML suspension    Sig: Take 10.9 mLs (875 mg total) by mouth 2 (two) times daily. For 7 days    Dispense:  153 mL    Refill:   0      Follow up plan: Return if symptoms worsen or fail to improve.  Nobie Putnam, [facility] 02/11/2022, 2:24 PM

## 2022-02-11 NOTE — Patient Instructions (Addendum)
Thank you for coming to the office today. ? ?[facility] ?[place] [facility] ?[address]  ?[place], [place] [address] ?Ph: [phone] ? ?They have an office in [place] as well. See if you can get in either one ASAP. We will place referral today. ? ?======================== ? ?1. You have symptoms of Vertigo (Benign Paroxysmal Positional Vertigo) ?- This is commonly caused by inner ear fluid imbalance, sometimes can be worsened by allergies and sinus symptoms, otherwise it can occur randomly sometimes and we may never discover the exact cause. ?- To treat this, try the Epley Manuever (see diagrams/instructions below) at home up to 3 times a day for 1-2 weeks or until symptoms resolve ?- You may take Meclizine as needed up to 3 times a day for dizziness, this will not cure symptoms but may help. Caution may make you drowsy. ? ?If you develop significant worsening episode with vertigo that does not improve and you get severe headache, loss of vision, arm or leg weakness, slurred speech, or other concerning symptoms please seek immediate medical attention at Emergency Department. ? ?Please schedule a follow-up appointment with Dr Althea Charon within 4 weeks if Vertigo not improving, and will consider Referral to Vestibular Rehab ? ?See the next page for images describing the Epley Manuever. ? ? ? ? ?---------------------------------------------------------------------------------------------------------------------- ? ? ? ? ? ? ?Please schedule a Follow-up Appointment to: No follow-ups on file. ? ?If you have any other questions or concerns, please feel free to call the office or send a message through MyChart. You may also schedule an earlier appointment if necessary. ? ?Additionally, you may be receiving a survey about your experience at our office within a few days to 1 week by e-mail or mail. We value your feedback. ? ?Saralyn Pilar, DO ?[facility] [place], [place] ?

## 2022-02-11 NOTE — Telephone Encounter (Signed)
?  Chief Complaint: ongoing dizziness ?Symptoms: dizzy, faint, not driving ?Frequency: ongoing- over 1 month- patient has frustration ?Pertinent Negatives: Patient denies fever, chest pain, vomiting, diarrhea, bleeding ?Disposition: [] ED /[] Urgent Care (no appt availability in office) / [x] Appointment(In office/virtual)/ []  [place] [facility]/ [] Home Care/ [] Refused Recommended Disposition /[] [place] [facility]/ []  Follow-up with PCP ?Additional Notes:    ?

## 2022-03-01 DIAGNOSIS — H903 Sensorineural hearing loss, bilateral: Secondary | ICD-10-CM | POA: Diagnosis not present

## 2022-03-01 DIAGNOSIS — R42 Dizziness and giddiness: Secondary | ICD-10-CM | POA: Diagnosis not present

## 2022-03-11 ENCOUNTER — Other Ambulatory Visit: Payer: Self-pay | Admitting: Family Medicine

## 2022-03-11 DIAGNOSIS — E782 Mixed hyperlipidemia: Secondary | ICD-10-CM

## 2022-03-12 NOTE — Telephone Encounter (Signed)
Requested Prescriptions  ?Pending Prescriptions Disp Refills  ?? rosuvastatin (CRESTOR) 10 MG tablet [Pharmacy Med Name: Rosuvastatin Calcium 10 MG Oral Tablet] 90 tablet 0  ?  Sig: TAKE 1 TABLET BY MOUTH AT BEDTIME (DISCONTINUE  SIMVASTATIN  AND  START  THIS)  ?  ? Cardiovascular:  Antilipid - Statins 2 Failed - 03/11/2022 10:29 AM  ?  ?  Failed - Lipid Panel in normal range within the last 12 months  ?  Cholesterol  ?Date Value Ref Range Status  ?08/27/2021 150 <200 mg/dL Final  ? ?LDL Cholesterol (Calc)  ?Date Value Ref Range Status  ?08/27/2021 78 mg/dL (calc) Final  ?  Comment:  ?  Reference range: <100 ?Marland Kitchen ?Desirable range <100 mg/dL for primary prevention;   ?<70 mg/dL for patients with CHD or diabetic patients  ?with > or = 2 CHD risk factors. ?. ?LDL-C is now calculated using the Martin-Hopkins  ?calculation, which is a validated novel method providing  ?better accuracy than the Friedewald equation in the  ?estimation of LDL-C.  ?Horald Pollen et al. Lenox Ahr. 2355;732(20): 2061-2068  ?(http://education.[facility].com/faq/FAQ164) ?  ? ?HDL  ?Date Value Ref Range Status  ?08/27/2021 42 (L) > OR = 50 mg/dL Final  ? ?Triglycerides  ?Date Value Ref Range Status  ?08/27/2021 200 (H) <150 mg/dL Final  ?  Comment:  ?  . ?If a non-fasting specimen was collected, consider ?repeat triglyceride testing on a fasting specimen ?if clinically indicated.  ?Henrene Pastor al. J. of Clin. Lipidol. 2015;9:129-169. ?. ?  ? ?  ?  ?  Passed - Cr in normal range and within 360 days  ?  Creat  ?Date Value Ref Range Status  ?01/04/2022 0.92 0.50 - 1.03 mg/dL Final  ?   ?  ?  Passed - Patient is not pregnant  ?  ?  Passed - Valid encounter within last 12 months  ?  Recent Outpatient Visits   ?      ? 4 weeks ago Vertigo  ? [facility] [place], Netta Neat, DO  ? 2 months ago Left facial pain  ? [facility], Netta Neat, DO  ? 3 months ago Left facial pain  ? [facility]  [place], Netta Neat, DO  ? 4 months ago COVID-19  ? [facility] [place], [place] W, NP  ? 6 months ago Pre-diabetes  ? [facility] Smitty Cords, DO  ?  ?  ?Future Appointments   ?        ? Tomorrow Althea Charon, Netta Neat, DO [facility]  ?  ? ?  ?  ?  ? ? ?

## 2022-03-13 ENCOUNTER — Encounter: Payer: BC Managed Care – PPO | Admitting: Family Medicine

## 2022-03-15 DIAGNOSIS — F3340 Major depressive disorder, recurrent, in remission, unspecified: Secondary | ICD-10-CM | POA: Diagnosis not present

## 2022-03-15 DIAGNOSIS — F410 Panic disorder [episodic paroxysmal anxiety] without agoraphobia: Secondary | ICD-10-CM | POA: Diagnosis not present

## 2022-03-15 DIAGNOSIS — F411 Generalized anxiety disorder: Secondary | ICD-10-CM | POA: Diagnosis not present

## 2022-03-18 ENCOUNTER — Ambulatory Visit (INDEPENDENT_AMBULATORY_CARE_PROVIDER_SITE_OTHER): Payer: BC Managed Care – PPO | Admitting: Family Medicine

## 2022-03-18 ENCOUNTER — Encounter: Payer: Self-pay | Admitting: Family Medicine

## 2022-03-18 VITALS — BP 122/70 | HR 78 | Ht 64.0 in | Wt 168.8 lb

## 2022-03-18 DIAGNOSIS — F3340 Major depressive disorder, recurrent, in remission, unspecified: Secondary | ICD-10-CM

## 2022-03-18 DIAGNOSIS — Z0000 Encounter for general adult medical examination without abnormal findings: Secondary | ICD-10-CM | POA: Diagnosis not present

## 2022-03-18 DIAGNOSIS — E782 Mixed hyperlipidemia: Secondary | ICD-10-CM | POA: Diagnosis not present

## 2022-03-18 DIAGNOSIS — R7303 Prediabetes: Secondary | ICD-10-CM | POA: Diagnosis not present

## 2022-03-18 DIAGNOSIS — F5104 Psychophysiologic insomnia: Secondary | ICD-10-CM

## 2022-03-18 DIAGNOSIS — Z1231 Encounter for screening mammogram for malignant neoplasm of breast: Secondary | ICD-10-CM | POA: Diagnosis not present

## 2022-03-18 DIAGNOSIS — F411 Generalized anxiety disorder: Secondary | ICD-10-CM

## 2022-03-18 NOTE — Progress Notes (Signed)
? ?Subjective:  ? ? Patient ID: Amy Hutchinson, female    DOB: 1961/05/09, 61 y.o.   MRN: [id number] ? ?Amy Hutchinson is a 61 y.o. female presenting on 03/18/2022 for Annual Exam ? ? ?HPI ? ?Here for Annual Physical ? ?Pre-Diabetes ?Prior readings 5.5 to 5.8 ?She has fam history of Diabetes ?Trying to limit diet ?Admits some sugar in diet ?  ?HYPERLIPIDEMIA: ?- Reports no concerns. Last lipid panel 09/2021 improved significantly after switched Simvastatin 10mg  to Rosuvastatin 10mg  ?- Now results with LDL 155 down to 78 and total chol 225 down to 150. ?Check labs again today ?- Currently taking Rosuvastatin 10mg  nightly, tolerating well without side effects or myalgias ?  ?Generalized Anxiety Disorder ?Major Depression, chronic recurrent in remission ?Chronic history of anxiety disorder, can be sporadic triggers, sometimes unprovoked, has had long term issue with episodic anxiety flares and some panic. ?Followed by [facility] - NP on medication management currently with alprazolam ?Continues on medication management with Alprazolam, Citalopram, Quetiapine ?  ?Low Back Pain, Spinal Stenosis, Lumbar OA/DJD ?Chronic problem for years ?Followed by [facility]. ? ?Tobacco Abuse ?Currently smoking < 0.5 ppd. She admits concern with quit smoking and weight gain. In past she has quit cold . Has NRT Patches at home. ? ?  ?Health Maintenance: ?  ?Mammogram due for scheduling. Order is already in. ?  ?History of Cologuard. Previously did cologuard a few times in the past, most recent was in [place] 06/2020.  She declines repeat Cologuard or Colonoscopy.  ?- Next due Summer 2024 ?  ?Due for Flu Shot, will receive today  ?  ?COVID Vaccine booster pfizer completed 12/12/20, now due for 2nd updated booster now at pharmacy ?  ?  ? ?  03/18/2022  ?  8:25 AM 02/11/2022  ?  2:31 PM 12/05/2021  ?  2:07 PM  ?Depression screen PHQ 2/9  ?Decreased Interest 0 1 0  ?Down, Depressed, Hopeless 0 0 0  ?PHQ - 2 Score 0 1 0   ?Altered sleeping 0 1 0  ?Tired, decreased energy 0 0 0  ?Change in appetite 0 0 0  ?Feeling bad or failure about yourself  0 0 0  ?Trouble concentrating 0 0 0  ?Moving slowly or fidgety/restless 0 0 0  ?Suicidal thoughts 0 0 0  ?PHQ-9 Score 0 2 0  ?Difficult doing work/chores Not difficult at all Not difficult at all Not difficult at all  ? ? ?Past Medical History:  ?Diagnosis Date  ?? Anxiety   ?? Back pain   ?? Depression   ?? Hyperlipidemia   ? ?Past Surgical History:  ?Procedure Laterality Date  ?? ABDOMINAL HYSTERECTOMY    ?? BRAIN SURGERY    ?? NECK SURGERY    ?? TONSILLECTOMY AND ADENOIDECTOMY    ? ?Social History  ? ?Socioeconomic History  ?? Marital status: Married  ?  Spouse name: Not on file  ?? Number of children: Not on file  ?? Years of education: High School  ?? Highest education level: High school graduate  ?Occupational History  ?? Not on file  ?Tobacco Use  ?? Smoking status: Every Day  ?  Packs/day: 0.40  ?  Years: 32.00  ?  Pack years: 12.80  ?  Types: Cigarettes  ?? Smokeless tobacco: Never  ?Vaping Use  ?? Vaping Use: Never used  ?Substance and Sexual Activity  ?? Alcohol use: Not Currently  ?? Drug use: Never  ?? Sexual activity: Not on file  ?  Other Topics Concern  ?? Not on file  ?Social History Narrative  ?? Not on file  ? ?Social Determinants of Health  ? ?Financial Resource Strain: Not on file  ?Food Insecurity: Not on file  ?Transportation Needs: Not on file  ?Physical Activity: Not on file  ?Stress: Not on file  ?Social Connections: Not on file  ?Intimate Partner Violence: Not on file  ? ?Family History  ?Problem Relation Age of Onset  ?? Diabetes Mother   ?? Bipolar disorder Mother   ?? Anxiety disorder Mother   ?? Depression Mother   ?? Lung cancer Mother   ?     lung  ?? Anxiety disorder Sister   ?? Depression Sister   ?? Anxiety disorder Sister   ?? Colon cancer Neg Hx   ?? Breast cancer Neg Hx   ? ?Current Outpatient Medications on File Prior to Visit  ?Medication Sig  ??  alprazolam (XANAX) 2 MG tablet Take 0.5 tablets (1 mg total) by mouth 4 (four) times daily as needed for sleep or anxiety.  ?? citalopram (CELEXA) 40 MG tablet Take 1 tablet (40 mg total) by mouth daily.  ?? cyclobenzaprine (FLEXERIL) 10 MG tablet Take 1 tablet (10 mg total) by mouth 3 (three) times daily as needed for muscle spasms.  ?? EPITOL 200 MG tablet Take by mouth.  ?? hydrOXYzine (ATARAX/VISTARIL) 25 MG tablet Take 1 tablet (25 mg total) by mouth 3 (three) times daily as needed.  ?? meclizine (ANTIVERT) 25 MG tablet Take 1 tablet (25 mg total) by mouth 3 (three) times daily as needed for dizziness.  ?? nicotine (NICODERM CQ) 14 mg/24hr patch Place 1 patch (14 mg total) onto the skin daily.  ?? QUEtiapine (SEROQUEL) 100 MG tablet Take 1 tablet (100 mg total) by mouth at bedtime. (Patient taking differently: Take 100 mg by mouth 2 (two) times daily.)  ?? rosuvastatin (CRESTOR) 10 MG tablet TAKE 1 TABLET BY MOUTH AT BEDTIME (DISCONTINUE  SIMVASTATIN  AND  START  THIS)  ? ?No current facility-administered medications on file prior to visit.  ? ? ?Review of Systems  ?Constitutional:  Negative for activity change, appetite change, chills, diaphoresis, fatigue and fever.  ?HENT:  Negative for congestion and hearing loss.   ?Eyes:  Negative for visual disturbance.  ?Respiratory:  Negative for cough, chest tightness, shortness of breath and wheezing.   ?Cardiovascular:  Negative for chest pain, palpitations and leg swelling.  ?Gastrointestinal:  Negative for abdominal pain, constipation, diarrhea, nausea and vomiting.  ?Genitourinary:  Negative for dysuria, frequency and hematuria.  ?Musculoskeletal:  Negative for arthralgias and neck pain.  ?Skin:  Negative for rash.  ?Neurological:  Negative for dizziness, weakness, light-headedness, numbness and headaches.  ?Hematological:  Negative for adenopathy.  ?Psychiatric/Behavioral:  Negative for behavioral problems, dysphoric mood and sleep disturbance.   ?Per HPI unless  specifically indicated above ? ?   ?Objective:  ?  ?BP 122/70   Pulse 78   Ht 5\' 4"  (1.626 m)   Wt 168 lb 12.8 oz (76.6 kg)   SpO2 98%   BMI 28.97 kg/m?   ?Wt Readings from Last 3 Encounters:  ?03/18/22 168 lb 12.8 oz (76.6 kg)  ?02/11/22 168 lb 9.6 oz (76.5 kg)  ?01/04/22 167 lb 9.6 oz (76 kg)  ?  ?Physical Exam ?Vitals and nursing note reviewed.  ?Constitutional:   ?   General: She is not in acute distress. ?   Appearance: She is well-developed. She is not diaphoretic.  ?  Comments: Well-appearing, comfortable, cooperative  ?HENT:  ?   Head: Normocephalic and atraumatic.  ?Eyes:  ?   General:     ?   Right eye: No discharge.     ?   Left eye: No discharge.  ?   Conjunctiva/sclera: Conjunctivae normal.  ?   Pupils: Pupils are equal, round, and reactive to light.  ?Neck:  ?   Thyroid: No thyromegaly.  ?   Vascular: No carotid bruit.  ?Cardiovascular:  ?   Rate and Rhythm: Normal rate and regular rhythm.  ?   Pulses: Normal pulses.  ?   Heart sounds: Normal heart sounds. No murmur heard. ?Pulmonary:  ?   Effort: Pulmonary effort is normal. No respiratory distress.  ?   Breath sounds: Normal breath sounds. No wheezing or rales.  ?Abdominal:  ?   General: Bowel sounds are normal. There is no distension.  ?   Palpations: Abdomen is soft. There is no mass.  ?   Tenderness: There is no abdominal tenderness.  ?Musculoskeletal:     ?   General: No tenderness. Normal range of motion.  ?   Cervical back: Normal range of motion and neck supple.  ?   Right lower leg: No edema.  ?   Left lower leg: No edema.  ?   Comments: Upper / Lower Extremities: ?- Normal muscle tone, strength bilateral upper extremities 5/5, lower extremities 5/5  ?Lymphadenopathy:  ?   Cervical: No cervical adenopathy.  ?Skin: ?   General: Skin is warm and dry.  ?   Findings: No erythema or rash.  ?Neurological:  ?   Mental Status: She is alert and oriented to person, place, and time.  ?   Comments: Distal sensation intact to light touch all  extremities  ?Psychiatric:     ?   Mood and Affect: Mood normal.     ?   Behavior: Behavior normal.     ?   Thought Content: Thought content normal.  ?   Comments: Well groomed, good eye contact, normal speech and thoughts  ? ?

## 2022-03-18 NOTE — Patient Instructions (Addendum)
Thank you for coming to the office today. ? ?Look into insurance cost/copay of Shingles shot, Shingrix vaccine, 2 doses, 2-6 months apart, can cause flu-like reaction. ? ?Labs ordered today. Stay tuned for results. ? ?For Mammogram screening for breast cancer  ? ?Call the Imaging Center below anytime to schedule your own appointment now that order has been placed. ? ?[facility] ?[facility] ?[address] ?[place], [place] [address] ?Phone: [phone] ? ?Schedule time to sign their release forms and they can get the copies of previous orders for you. ? ?Good luck with quitting smoking. ? ?-------------------------------------------------------------- ? ?For Constipation (less frequent bowel movement that can be hard dry or involve straining). ? ?Recommend trying OTC Miralax 17g = 1 capful in large glass water once daily for now, try several days to see if working, goal is soft stool or BM 1-2 times daily, if too loose then reduce dose or try every other day. If not effective may need to increase it to 2 doses at once in AM or may do 1 in morning and 1 in afternoon/evening ? ?- This medicine is very safe and can be used often without any problem and will not make you dehydrated. It is good for use on AS NEEDED BASIS or even MAINTENANCE therapy for longer term for several days to weeks at a time to help regulate bowel movements ? ?Other more natural remedies or preventative treatment: ?- Increase hydration with water ?- Increase fiber in diet (high fiber foods = vegetables, leafy greens, oats/grains) ?- May take OTC Fiber supplement (metamucil powder or pill/gummy) ?- May try OTC Probiotic ? ? ?Please schedule a Follow-up Appointment to: Return if symptoms worsen or fail to improve. ? ?If you have any other questions or concerns, please feel free to call the office or send a message through MyChart. You may also schedule an earlier appointment if necessary. ? ?Additionally,  you may be receiving a survey about your experience at our office within a few days to 1 week by e-mail or mail. We value your feedback. ? ?Saralyn Pilar, DO ?[facility], [place] ?

## 2022-03-19 LAB — HEMOGLOBIN A1C
Hgb A1c MFr Bld: 5.9 % of total Hgb — ABNORMAL HIGH (ref <5.7)
Mean Plasma Glucose: 123 mg/dL
eAG (mmol/L): 6.8 mmol/L

## 2022-03-19 LAB — CBC WITH DIFFERENTIAL/PLATELET
Absolute Monocytes: 522 cells/uL (ref 200–950)
Basophils Absolute: 81 cells/uL (ref 0–200)
Basophils Relative: 0.9 %
Eosinophils Absolute: 144 cells/uL (ref 15–500)
Eosinophils Relative: 1.6 %
HCT: 43.8 % (ref 35.0–45.0)
Hemoglobin: 14 g/dL (ref 11.7–15.5)
Lymphs Abs: 3231 cells/uL (ref 850–3900)
MCH: 28.2 pg (ref 27.0–33.0)
MCHC: 32 g/dL (ref 32.0–36.0)
MCV: 88.1 fL (ref 80.0–100.0)
MPV: 10.3 fL (ref 7.5–12.5)
Monocytes Relative: 5.8 %
Neutro Abs: 5022 cells/uL (ref 1500–7800)
Neutrophils Relative %: 55.8 %
Platelets: 295 10*3/uL (ref 140–400)
RBC: 4.97 10*6/uL (ref 3.80–5.10)
RDW: 14.1 % (ref 11.0–15.0)
Total Lymphocyte: 35.9 %
WBC: 9 10*3/uL (ref 3.8–10.8)

## 2022-03-19 LAB — BASIC METABOLIC PANEL WITH GFR
BUN: 12 mg/dL (ref 7–25)
CO2: 23 mmol/L (ref 20–32)
Calcium: 9.4 mg/dL (ref 8.6–10.4)
Chloride: 112 mmol/L — ABNORMAL HIGH (ref 98–110)
Creat: 0.83 mg/dL (ref 0.50–1.03)
Glucose, Bld: 90 mg/dL (ref 65–139)
Potassium: 4.6 mmol/L (ref 3.5–5.3)
Sodium: 141 mmol/L (ref 135–146)
eGFR: 81 mL/min/{1.73_m2} (ref >=60)

## 2022-03-19 LAB — LIPID PANEL
Cholesterol: 174 mg/dL (ref <200)
HDL: 40 mg/dL — ABNORMAL LOW (ref >=50)
LDL Cholesterol (Calc): 104 mg/dL (calc) — ABNORMAL HIGH
Non-HDL Cholesterol (Calc): 134 mg/dL (calc) — ABNORMAL HIGH (ref <130)
Total CHOL/HDL Ratio: 4.4 (calc) (ref <5.0)
Triglycerides: 181 mg/dL — ABNORMAL HIGH (ref <150)

## 2022-03-19 LAB — TSH: TSH: 1.74 mIU/L (ref 0.40–4.50)

## 2022-03-27 DIAGNOSIS — R42 Dizziness and giddiness: Secondary | ICD-10-CM | POA: Diagnosis not present

## 2022-04-01 DIAGNOSIS — R42 Dizziness and giddiness: Secondary | ICD-10-CM | POA: Diagnosis not present

## 2022-04-01 DIAGNOSIS — H903 Sensorineural hearing loss, bilateral: Secondary | ICD-10-CM | POA: Diagnosis not present

## 2022-04-09 ENCOUNTER — Inpatient Hospital Stay
Admission: RE | Admit: 2022-04-09 | Discharge: 2022-04-09 | Disposition: A | Discharge status: Home / Self Care | Payer: Self-pay | Source: Ambulatory Visit | Attending: *Deleted | Admitting: *Deleted

## 2022-04-09 ENCOUNTER — Other Ambulatory Visit: Payer: Self-pay | Admitting: *Deleted

## 2022-04-09 DIAGNOSIS — Z1231 Encounter for screening mammogram for malignant neoplasm of breast: Secondary | ICD-10-CM

## 2022-04-25 DIAGNOSIS — R519 Headache, unspecified: Secondary | ICD-10-CM | POA: Diagnosis not present

## 2022-04-25 DIAGNOSIS — R42 Dizziness and giddiness: Secondary | ICD-10-CM | POA: Diagnosis not present

## 2022-04-25 DIAGNOSIS — G500 Trigeminal neuralgia: Secondary | ICD-10-CM | POA: Diagnosis not present

## 2022-04-25 DIAGNOSIS — H9202 Otalgia, left ear: Secondary | ICD-10-CM | POA: Diagnosis not present

## 2022-05-13 ENCOUNTER — Ambulatory Visit
Admission: RE | Admit: 2022-05-13 | Discharge: 2022-05-13 | Disposition: A | Discharge status: Home / Self Care | Payer: BC Managed Care – PPO | Source: Ambulatory Visit | Attending: Family Medicine | Admitting: Family Medicine

## 2022-05-13 DIAGNOSIS — Z1231 Encounter for screening mammogram for malignant neoplasm of breast: Secondary | ICD-10-CM | POA: Insufficient documentation

## 2022-06-02 ENCOUNTER — Other Ambulatory Visit: Payer: Self-pay | Admitting: Family Medicine

## 2022-06-02 DIAGNOSIS — E782 Mixed hyperlipidemia: Secondary | ICD-10-CM

## 2022-06-03 NOTE — Telephone Encounter (Signed)
Requested Prescriptions  Pending Prescriptions Disp Refills  . rosuvastatin (CRESTOR) 10 MG tablet [Pharmacy Med Name: Rosuvastatin Calcium 10 MG Oral Tablet] 90 tablet 2    Sig: TAKE 1 TABLET BY MOUTH AT BEDTIME -  STOP  SIMVASTATIN     Cardiovascular:  Antilipid - Statins 2 Failed - 06/02/2022 11:13 AM      Failed - Lipid Panel in normal range within the last 12 months    Cholesterol  Date Value Ref Range Status  03/18/2022 174 <200 mg/dL Final   LDL Cholesterol (Calc)  Date Value Ref Range Status  03/18/2022 104 (H) mg/dL (calc) Final    Comment:    Reference range: <100 . Desirable range <100 mg/dL for primary prevention;   <70 mg/dL for patients with CHD or diabetic patients  with > or = 2 CHD risk factors. Marland Kitchen LDL-C is now calculated using the Martin-Hopkins  calculation, which is a validated novel method providing  better accuracy than the Friedewald equation in the  estimation of LDL-C.  Horald Pollen et al. Lenox Ahr. 7915;056(97): 2061-2068  (http://education.[employer].com/faq/FAQ164)    HDL  Date Value Ref Range Status  03/18/2022 40 (L) > OR = 50 mg/dL Final   Triglycerides  Date Value Ref Range Status  03/18/2022 181 (H) <150 mg/dL Final         Passed - Cr in normal range and within 360 days    Creat  Date Value Ref Range Status  03/18/2022 0.83 0.50 - 1.03 mg/dL Final         Passed - Patient is not pregnant      Passed - Valid encounter within last 12 months    Recent Outpatient Visits          2 months ago Annual physical exam   [facility] Smitty Cords, DO   3 months ago Vertigo   [facility] Smitty Cords, DO   5 months ago Left facial pain   [facility] Smitty Cords, DO   6 months ago Left facial pain   [facility] Smitty Cords, DO   7 months ago COVID-19   [facility] [place], [place], [place]

## 2022-06-17 DIAGNOSIS — F410 Panic disorder [episodic paroxysmal anxiety] without agoraphobia: Secondary | ICD-10-CM | POA: Diagnosis not present

## 2022-06-17 DIAGNOSIS — F3340 Major depressive disorder, recurrent, in remission, unspecified: Secondary | ICD-10-CM | POA: Diagnosis not present

## 2022-06-17 DIAGNOSIS — F411 Generalized anxiety disorder: Secondary | ICD-10-CM | POA: Diagnosis not present

## 2022-08-23 ENCOUNTER — Other Ambulatory Visit: Payer: Self-pay

## 2022-08-23 ENCOUNTER — Ambulatory Visit: Payer: Self-pay

## 2022-08-23 ENCOUNTER — Emergency Department: Payer: BC Managed Care – PPO

## 2022-08-23 ENCOUNTER — Emergency Department
Admission: EM | Admit: 2022-08-23 | Discharge: 2022-08-23 | Disposition: A | Discharge status: Home / Self Care | Payer: BC Managed Care – PPO | Attending: Emergency Medicine | Admitting: Emergency Medicine

## 2022-08-23 DIAGNOSIS — M7989 Other specified soft tissue disorders: Secondary | ICD-10-CM | POA: Diagnosis not present

## 2022-08-23 DIAGNOSIS — L03115 Cellulitis of right lower limb: Secondary | ICD-10-CM | POA: Diagnosis not present

## 2022-08-23 DIAGNOSIS — L03116 Cellulitis of left lower limb: Secondary | ICD-10-CM | POA: Diagnosis not present

## 2022-08-23 LAB — HEPATIC FUNCTION PANEL
ALT: 21 U/L (ref 0–44)
AST: 19 U/L (ref 15–41)
Albumin: 4 g/dL (ref 3.5–5.0)
Alkaline Phosphatase: 73 U/L (ref 38–126)
Bilirubin, Direct: 0.1 mg/dL (ref 0.0–0.2)
Indirect Bilirubin: 0.5 mg/dL (ref 0.3–0.9)
Total Bilirubin: 0.6 mg/dL (ref 0.3–1.2)
Total Protein: 7 g/dL (ref 6.5–8.1)

## 2022-08-23 LAB — CBC WITH DIFFERENTIAL/PLATELET
Abs Immature Granulocytes: 0.02 10*3/uL (ref 0.00–0.07)
Basophils Absolute: 0.1 10*3/uL (ref 0.0–0.1)
Basophils Relative: 1 %
Eosinophils Absolute: 0.2 10*3/uL (ref 0.0–0.5)
Eosinophils Relative: 2 %
HCT: 40.7 % (ref 36.0–46.0)
Hemoglobin: 13.2 g/dL (ref 12.0–15.0)
Immature Granulocytes: 0 %
Lymphocytes Relative: 41 %
Lymphs Abs: 4.6 10*3/uL — ABNORMAL HIGH (ref 0.7–4.0)
MCH: 28.9 pg (ref 26.0–34.0)
MCHC: 32.4 g/dL (ref 30.0–36.0)
MCV: 89.1 fL (ref 80.0–100.0)
Monocytes Absolute: 0.5 10*3/uL (ref 0.1–1.0)
Monocytes Relative: 4 %
Neutro Abs: 5.8 10*3/uL (ref 1.7–7.7)
Neutrophils Relative %: 52 %
Platelets: 304 10*3/uL (ref 150–400)
RBC: 4.57 MIL/uL (ref 3.87–5.11)
RDW: 13.9 % (ref 11.5–15.5)
WBC: 11.1 10*3/uL — ABNORMAL HIGH (ref 4.0–10.5)
nRBC: 0 % (ref 0.0–0.2)

## 2022-08-23 LAB — BASIC METABOLIC PANEL
Anion gap: 5 (ref 5–15)
BUN: 19 mg/dL (ref 6–20)
CO2: 25 mmol/L (ref 22–32)
Calcium: 9.3 mg/dL (ref 8.9–10.3)
Chloride: 111 mmol/L (ref 98–111)
Creatinine, Ser: 0.7 mg/dL (ref 0.44–1.00)
GFR, Estimated: >60 mL/min (ref >60)
Glucose, Bld: 117 mg/dL — ABNORMAL HIGH (ref 70–99)
Potassium: 3.6 mmol/L (ref 3.5–5.1)
Sodium: 141 mmol/L (ref 135–145)

## 2022-08-23 MED ORDER — CEPHALEXIN 500 MG PO CAPS
500.0000 mg | ORAL_CAPSULE | Freq: Four times a day (QID) | ORAL | 0 refills | Status: AC
Start: 2022-08-23 — End: 2022-08-30

## 2022-08-23 MED ORDER — DOXYCYCLINE MONOHYDRATE 100 MG PO TABS: 100.0000 mg | ORAL_TABLET | Freq: Two times a day (BID) | ORAL | 0 refills | Status: AC

## 2022-08-23 MED ORDER — CEFTRIAXONE SODIUM 1 G IJ SOLR
1.0000 g | Freq: Once | INTRAMUSCULAR | Status: AC
  Administered 2022-08-23: 1 g via INTRAMUSCULAR
  Filled 2022-08-23: qty 10

## 2022-08-23 NOTE — ED Notes (Signed)
Pt in with bilateral leg swelling, rash and bruising to shins. Pt recently back from trip to [place]. Pt denies fever and SOB. Pt's feet warm and able to move them appropriately.

## 2022-08-23 NOTE — ED Notes (Signed)
D/C and reasons to return discussed with pt, pt verbalized understanding. NAD noted. Pt ambulatory on D/C.  

## 2022-08-23 NOTE — ED Provider Notes (Signed)
[facility] Provider Note  Patient Contact: 3:21 PM (approximate)   History   Leg Swelling   HPI  Amy Hutchinson is a 61 y.o. female with a history of hyperlipidemia and recent travel, presents to the emergency department with bilateral lower extremity swelling, erythema and new bruising on the left.  Patient states that she recently returned home from a cruise but did take a flight in order to department cruise.  She denies pleuritic chest pain or shortness of breath.  No falls or mechanisms of trauma.  She states that the beach that she went to was very [place] compared to other beaches she has frequented.  She denies personal history of DVT or PE.  No fever or chills      Physical Exam   Triage Vital Signs: ED Triage Vitals  Enc Vitals Group     BP 08/23/22 1400 (!) 145/79     Pulse Rate 08/23/22 1400 85     Resp 08/23/22 1400 16     Temp 08/23/22 1400 98.4 F (36.9 C)     Temp Source 08/23/22 1400 Oral     SpO2 08/23/22 1400 98 %     Weight 08/23/22 1403 168 lb (76.2 kg)     Height --      Head Circumference --      Peak Flow --      Pain Score 08/23/22 1403 0     Pain Loc --      Pain Edu? --      Excl. in [place]? --     Most recent vital signs: Vitals:   08/23/22 1636 08/23/22 1700  BP: 123/87 127/85  Pulse: 70 (!) 58  Resp: 18 19  Temp:    SpO2: 100% 100%     General: Alert and in no acute distress. Eyes:  PERRL. EOMI. Head: No acute traumatic findings ENT:      Nose: No congestion/rhinnorhea.      Mouth/Throat: Mucous membranes are moist. Neck: No stridor. No cervical spine tenderness to palpation. Cardiovascular:  Good peripheral perfusion Respiratory: Normal respiratory effort without tachypnea or retractions. Lungs CTAB. Good air entry to the bases with no decreased or absent breath sounds. Gastrointestinal: Bowel sounds 4 quadrants. Soft and nontender to palpation. No guarding or rigidity. No palpable masses. No distention. No  CVA tenderness. Musculoskeletal: Full range of motion to all extremities.  Neurologic:  No gross focal neurologic deficits are appreciated.  Skin: Patient has erythema and petechial rash along bilateral lower extremities with bruising along the left calf. Other:   ED Results / Procedures / Treatments   Labs (all labs ordered are listed, but only abnormal results are displayed) Labs Reviewed  CBC WITH DIFFERENTIAL/PLATELET - Abnormal; Notable for the following components:      Result Value   WBC 11.1 (*)    Lymphs Abs 4.6 (*)    All other components within normal limits  BASIC METABOLIC PANEL - Abnormal; Notable for the following components:   Glucose, Bld 117 (*)    All other components within normal limits  HEPATIC FUNCTION PANEL        RADIOLOGY  I personally viewed and evaluated these images as part of my medical decision making, as well as reviewing the written report by the radiologist.  ED Provider Interpretation: No evidence of DVT bilaterally.   PROCEDURES:  Critical Care performed: No  Procedures   MEDICATIONS ORDERED IN ED: Medications  cefTRIAXone (ROCEPHIN) injection 1 g (1  g Intramuscular Given 08/23/22 1627)     IMPRESSION / MDM / ASSESSMENT AND PLAN / ED COURSE  I reviewed the triage vital signs and the nursing notes.                              Assessment and plan:  Leg erythema and swelling 61 year old female presents to the emergency department with erythema of the bilateral lower extremities and a region of bruising along the left lateral calf.  Patient was mildly bradycardic at triage but vital signs were otherwise reassuring.  On exam, patient was alert and nontoxic-appearing.  Venous ultrasound showed no signs of DVT bilaterally.  CBC and CMP as well as hepatic function panel are reassuring.  I do suspect cellulitis secondary to recent salt water exposure.  Patient was given injection of Rocephin in the emergency department and discharged  with doxycycline and Keflex.  Return precautions were given to return with new or worsening symptoms.      FINAL CLINICAL IMPRESSION(S) / ED DIAGNOSES   Final diagnoses:  Cellulitis of right lower extremity  Cellulitis of left lower extremity     Rx / DC Orders   ED Discharge Orders          Ordered    doxycycline (ADOXA) 100 MG tablet  2 times daily        08/23/22 1725    cephALEXin (KEFLEX) 500 MG capsule  4 times daily        08/23/22 1725             Note:  This document was prepared using Dragon voice recognition software and may include unintentional dictation errors.   Vallarie Mare [place], PA-C 08/23/22 1749    Delman Kitten, MD 08/23/22 [phone]

## 2022-08-23 NOTE — Telephone Encounter (Signed)
  Chief Complaint: Widespread rash on both legs. Pt also has large bruise on left leg. Symptoms: above Frequency: Tuesday Pertinent Negatives: Patient denies fever Disposition: [] ED /[x] Urgent Care (no appt availability in office) / [] Appointment(In office/virtual)/ []  [place] [facility]/ [] Home Care/ [] Refused Recommended Disposition /[] [place] [facility]/ []  Follow-up with PCP Additional Notes: Pt was on a cruise this week and developed a rash on Tuesday. Medical personnel on ship thought it was sun poisoning and gave her hydrocortisone ointment. Pt has not gotten any relief. Pt has also developed a bruise on her left leg which is worrying her. Pt will go to UC rather than wait for appt. PT will not have transportation on Monday for OV.   Summary: rash on legs   Pt states she has a rash on the both legs and bruise on the lt leg   Pt requesting an appt asap   Please assist further      Reason for Disposition  [1] Severe localized itching AND [2] after 2 days of steroid cream  Answer Assessment - Initial Assessment Questions 1. APPEARANCE of RASH: "Describe the rash."      Red some spots are blotchy 2. LOCATION: "Where is the rash located?"     Started on left ankle,  spread up to her thighs. Rash on right leg as well. Left  3. NUMBER: "How many spots are there?"      lots 4. SIZE: "How big are the spots?" (Inches, centimeters or compare to size of a coin)      Rash all over and then pimples as well 5. ONSET: "When did the rash start?"      Tuesday 6. ITCHING: "Does the rash itch?" If Yes, ask: "How bad is the itch?"  (Scale 0-10; or none, mild, moderate, severe)     yes 7. PAIN: "Does the rash hurt?" If Yes, ask: "How bad is the pain?"  (Scale 0-10; or none, mild, moderate, severe)    - NONE (0): no pain    - MILD (1-3): doesn't interfere with normal activities     - MODERATE (4-7): interferes with normal activities or awakens from sleep     - SEVERE (8-10):  excruciating pain, unable to do any normal activities     Press  8. OTHER SYMPTOMS: "Do you have any other symptoms?" (e.g., fever)     none 9. PREGNANCY: "Is there any chance you are pregnant?" "When was your last menstrual period?"     na  Protocols used: Rash or Redness - Localized-A-AH

## 2022-08-23 NOTE — Discharge Instructions (Signed)
Take 2 tablets of Keflex in the morning and 2 tablets at night for the next 7 days. Take 1 tablet of doxycycline in the morning and 1 tablet at night. If symptoms worsen, please return to the emergency department for reevaluation.

## 2022-08-23 NOTE — ED Triage Notes (Signed)
Pt arrives with c/o bilateral leg swelling and rash that started while she was on a cruise. Pt also has bruising on her left calf. Pt denies injury. Pt denies fevers.

## 2022-09-11 ENCOUNTER — Ambulatory Visit: Payer: Self-pay | Admitting: *Deleted

## 2022-09-11 NOTE — Telephone Encounter (Signed)
Summary: Possible sinus infection symptoms   Pt called reporting that she has pain while swallowing, possible sinus infection symptoms. No appt with PCP until next week   Best contact: [phone]      Reason for Disposition . [1] Sinus congestion (pressure, fullness) AND [2] present > 10 days  Answer Assessment - Initial Assessment Questions 1. LOCATION: "Where does it hurt?"      Behind eyes, body aches 2. ONSET: "When did the sinus pain start?"  (e.g., hours, days)      9/22- was treated for skin infection, 2 weeks 3. SEVERITY: "How bad is the pain?"   (Scale 1-10; mild, moderate or severe)   - MILD (1-3): doesn't interfere with normal activities    - MODERATE (4-7): interferes with normal activities (e.g., work or school) or awakens from sleep   - SEVERE (8-10): excruciating pain and patient unable to do any normal activities        Headache and pressure behind eyes- 8 4. RECURRENT SYMPTOM: "Have you ever had sinus problems before?" If Yes, ask: "When was the last time?" and "What happened that time?"      Yes- all the time- chronic 5. NASAL CONGESTION: "Is the nose blocked?" If Yes, ask: "Can you open it or must you breathe through your mouth?"     Yes- able to open passages 6. NASAL DISCHARGE: "Do you have discharge from your nose?" If so ask, "What color?"     Clear/white 7. FEVER: "Do you have a fever?" If Yes, ask: "What is it, how was it measured, and when did it start?"      No  8. OTHER SYMPTOMS: "Do you have any other symptoms?" (e.g., sore throat, cough, earache, difficulty breathing)     Sore throat, ear pressure, hot/cold 9. PREGNANCY: "Is there any chance you are pregnant?" "When was your last menstrual period?"     *No Answer*  Protocols used: Sinus Pain or Congestion-A-AH

## 2022-09-11 NOTE — Telephone Encounter (Signed)
  Chief Complaint: sinus symptoms, sore throat Symptoms: sore throat, ear pressure, headache Frequency: 2 weeks Pertinent Negatives: Patient denies fever Disposition: [] ED /[] Urgent Care (no appt availability in office) / [x] Appointment(In office/virtual)/ []  [place] [facility]/ [] Home Care/ [] Refused Recommended Disposition /[] [place] [facility]/ []  Follow-up with PCP Additional Notes:

## 2022-09-12 ENCOUNTER — Encounter: Payer: Self-pay | Admitting: Family Medicine

## 2022-09-12 ENCOUNTER — Ambulatory Visit: Payer: BC Managed Care – PPO | Admitting: Family Medicine

## 2022-09-12 VITALS — BP 115/78 | HR 83 | Ht 64.0 in | Wt 167.5 lb

## 2022-09-12 DIAGNOSIS — J0110 Acute frontal sinusitis, unspecified: Secondary | ICD-10-CM | POA: Diagnosis not present

## 2022-09-12 MED ORDER — PREDNISONE 20 MG PO TABS: ORAL_TABLET | ORAL | 0 refills | Status: DC

## 2022-09-12 MED ORDER — AMOXICILLIN-POT CLAVULANATE 400-57 MG/5ML PO SUSR: 875.0000 mg | Freq: Two times a day (BID) | ORAL | 0 refills | Status: AC

## 2022-09-12 NOTE — Progress Notes (Signed)
Subjective:    Patient ID: Amy Hutchinson, female    DOB: 01/21/1961, 61 y.o.   MRN: [id number]  Amy Hutchinson is a 61 y.o. female presenting on 09/12/2022 for Sore Throat and Ear Fullness   HPI  Sinusitis, acute 2 weeks, episodic worsening sinus pain pressure congestion drainage sore throat No covid or flu testing No fever Cellulitis after trip to [place], ED on 9/22 - IV Ceftriaxone antibiotic and Doxycycline course, with resolution.     09/12/2022   11:11 AM 03/18/2022    8:25 AM 02/11/2022    2:31 PM  Depression screen PHQ 2/9  Decreased Interest 0 0 1  Down, Depressed, Hopeless 0 0 0  PHQ - 2 Score 0 0 1  Altered sleeping 0 0 1  Tired, decreased energy 0 0 0  Change in appetite 0 0 0  Feeling bad or failure about yourself  0 0 0  Trouble concentrating 0 0 0  Moving slowly or fidgety/restless 0 0 0  Suicidal thoughts 0 0 0  PHQ-9 Score 0 0 2  Difficult doing work/chores Not difficult at all Not difficult at all Not difficult at all    Social History   Tobacco Use   Smoking status: Every Day    Packs/day: 0.40    Years: 32.00    Total pack years: 12.80    Types: Cigarettes   Smokeless tobacco: Never  Vaping Use   Vaping Use: Never used  Substance Use Topics   Alcohol use: Not Currently   Drug use: Never    Review of Systems Per HPI unless specifically indicated above     Objective:    BP 115/78   Pulse 83   Ht 5\' 4"  (1.626 m)   Wt 167 lb 8 oz (76 kg)   SpO2 98%   BMI 28.75 kg/m   Wt Readings from Last 3 Encounters:  09/12/22 167 lb 8 oz (76 kg)  08/23/22 168 lb (76.2 kg)  03/18/22 168 lb 12.8 oz (76.6 kg)    Physical Exam Vitals and nursing note reviewed.  Constitutional:      General: She is not in acute distress.    Appearance: She is well-developed. She is not diaphoretic.     Comments: Well-appearing, comfortable, cooperative  HENT:     Head: Normocephalic and atraumatic.  Eyes:     General:        Right eye: No discharge.         Left eye: No discharge.     Conjunctiva/sclera: Conjunctivae normal.  Neck:     Thyroid: No thyromegaly.  Cardiovascular:     Rate and Rhythm: Normal rate and regular rhythm.     Heart sounds: Normal heart sounds. No murmur heard. Pulmonary:     Effort: Pulmonary effort is normal. No respiratory distress.     Breath sounds: Normal breath sounds. No wheezing or rales.  Musculoskeletal:        General: Normal range of motion.     Cervical back: Normal range of motion and neck supple.  Lymphadenopathy:     Cervical: No cervical adenopathy.  Skin:    General: Skin is warm and dry.     Findings: No erythema or rash.  Neurological:     Mental Status: She is alert and oriented to person, place, and time.  Psychiatric:        Behavior: Behavior normal.     Comments: Well groomed, good eye contact, normal speech and thoughts  Results for orders placed or performed during the hospital encounter of 08/23/22  CBC with Differential  Result Value Ref Range   WBC 11.1 (H) 4.0 - 10.5 K/uL   RBC 4.57 3.87 - 5.11 MIL/uL   Hemoglobin 13.2 12.0 - 15.0 g/dL   HCT 40.7 36.0 - 46.0 %   MCV 89.1 80.0 - 100.0 fL   MCH 28.9 26.0 - 34.0 pg   MCHC 32.4 30.0 - 36.0 g/dL   RDW 13.9 11.5 - 15.5 %   Platelets 304 150 - 400 K/uL   nRBC 0.0 0.0 - 0.2 %   Neutrophils Relative % 52 %   Neutro Abs 5.8 1.7 - 7.7 K/uL   Lymphocytes Relative 41 %   Lymphs Abs 4.6 (H) 0.7 - 4.0 K/uL   Monocytes Relative 4 %   Monocytes Absolute 0.5 0.1 - 1.0 K/uL   Eosinophils Relative 2 %   Eosinophils Absolute 0.2 0.0 - 0.5 K/uL   Basophils Relative 1 %   Basophils Absolute 0.1 0.0 - 0.1 K/uL   Immature Granulocytes 0 %   Abs Immature Granulocytes 0.02 0.00 - 0.07 K/uL  Basic metabolic panel  Result Value Ref Range   Sodium 141 135 - 145 mmol/L   Potassium 3.6 3.5 - 5.1 mmol/L   Chloride 111 98 - 111 mmol/L   CO2 25 22 - 32 mmol/L   Glucose, Bld 117 (H) 70 - 99 mg/dL   BUN 19 6 - 20 mg/dL   Creatinine, Ser  0.70 0.44 - 1.00 mg/dL   Calcium 9.3 8.9 - 10.3 mg/dL   GFR, Estimated >60 >60 mL/min   Anion gap 5 5 - 15  Hepatic function panel  Result Value Ref Range   Total Protein 7.0 6.5 - 8.1 g/dL   Albumin 4.0 3.5 - 5.0 g/dL   AST 19 15 - 41 U/L   ALT 21 0 - 44 U/L   Alkaline Phosphatase 73 38 - 126 U/L   Total Bilirubin 0.6 0.3 - 1.2 mg/dL   Bilirubin, Direct 0.1 0.0 - 0.2 mg/dL   Indirect Bilirubin 0.5 0.3 - 0.9 mg/dL      Assessment & Plan:   Problem List Items Addressed This Visit   None Visit Diagnoses     Acute non-recurrent frontal sinusitis    -  Primary   Relevant Medications   amoxicillin-clavulanate (AUGMENTIN) 400-57 MG/5ML suspension   predniSONE (DELTASONE) 20 MG tablet       Consistent with acute frontal sinusitis, likely initially viral URI vs allergic rhinitis component with worsening concern for bacterial infection.   Plan: Start Liquid Augmentin BID 10 day Prednisone taper 7 days for sinus pain pressure, hold oral NSAID OTC Coricidin, nasal saline spray Return criteria reviewed   Meds ordered this encounter  Medications   amoxicillin-clavulanate (AUGMENTIN) 400-57 MG/5ML suspension    Sig: Take 10.9 mLs (875 mg total) by mouth 2 (two) times daily for 10 days.    Dispense:  218 mL    Refill:  0   predniSONE (DELTASONE) 20 MG tablet    Sig: Take daily with food. Start with 60mg  (3 pills) x 2 days, then reduce to 40mg  (2 pills) x 2 days, then 20mg  (1 pill) x 3 days    Dispense:  13 tablet    Refill:  0      Follow up plan: Return if symptoms worsen or fail to improve.   Nobie Putnam, [facility] 09/12/2022,  11:17 AM

## 2022-09-12 NOTE — Patient Instructions (Addendum)
Thank you for coming to the office today.  1. It sounds like you have a Sinusitis (Bacterial Infection) - this most likely started as an Upper Respiratory Virus that has settled into an infection. Allergies can also cause this. - Start Augmentin 1 pill twice daily (breakfast and dinner, with food and plenty of water) for 10 days, complete entire course, do not stop early even if feeling better - Start Prednisone taper 7 days -= HOLD Ibuprofen for 7 days while on medicine. - Tylenol is safe - OTC Coricidin for decongestant safe with less side effects - Saline flush - Improve hydration by drinking plenty of clear fluids (water, gatorade) to reduce secretions and thin congestion - Congestion draining down throat can cause irritation. May try warm herbal tea with honey, cough drops - Can take Tylenol or Ibuprofen as needed for fevers  Safe to add Mucinex  If you develop persistent fever >101F for at least 3 consecutive days, headaches with sinus pain or pressure or persistent earache, please schedule a follow-up evaluation within next few days to week.   Please schedule a Follow-up Appointment to: Return if symptoms worsen or fail to improve.  If you have any other questions or concerns, please feel free to call the office or send a message through [place]. You may also schedule an earlier appointment if necessary.  Additionally, you may be receiving a survey about your experience at our office within a few days to 1 week by e-mail or mail. We value your feedback.  Nobie Putnam, DO [facility]

## 2022-09-13 DIAGNOSIS — F411 Generalized anxiety disorder: Secondary | ICD-10-CM | POA: Diagnosis not present

## 2022-09-13 DIAGNOSIS — F410 Panic disorder [episodic paroxysmal anxiety] without agoraphobia: Secondary | ICD-10-CM | POA: Diagnosis not present

## 2022-09-13 DIAGNOSIS — F3340 Major depressive disorder, recurrent, in remission, unspecified: Secondary | ICD-10-CM | POA: Diagnosis not present

## 2022-10-11 ENCOUNTER — Telehealth (INDEPENDENT_AMBULATORY_CARE_PROVIDER_SITE_OTHER): Payer: BC Managed Care – PPO | Admitting: Internal Medicine

## 2022-10-11 ENCOUNTER — Ambulatory Visit: Payer: Self-pay

## 2022-10-11 DIAGNOSIS — L299 Pruritus, unspecified: Secondary | ICD-10-CM | POA: Diagnosis not present

## 2022-10-11 MED ORDER — HYDROXYZINE HCL 25 MG PO TABS: 25.0000 mg | ORAL_TABLET | Freq: Three times a day (TID) | ORAL | 0 refills | Status: DC | PRN

## 2022-10-11 NOTE — Telephone Encounter (Signed)
  Chief Complaint: Widespread itching Symptoms: itching no rash Frequency: 4 days Pertinent Negatives: Patient denies fever, rash Disposition: [] ED /[] Urgent Care (no appt availability in office) / [x] Appointment(In office/virtual)/ []  [facility]/ [] Home Care/ [] Refused Recommended Disposition /[] [facility]/ []  Follow-up with PCP Additional Notes: Pt has a hx of allergies and itching. Pt was on a medication that helped with itching in the past. Pt would like to get that again.    Summary: severe itching 4 days   Pt states she had severe itching for 4 days.  She has no rash, no bumps, marks, nothing but the itching.  Pt states she used to be on a pill for itching, and that pill helped. Not sure what the pill was and not sure if Dr is the one that prescribed.  pt has hx of itching for yrs, but the past. Please advise..     Reason for Disposition  [1] Widespread itching AND [2] cause unknown AND [3] present > 48 hours  (Exception: Caller knows the cause and can eliminate it.)  Answer Assessment - Initial Assessment Questions 1. DESCRIPTION: "Describe the itching you are having."     Itching everywhere 2. SEVERITY: "How bad is it?"    - MILD: Doesn't interfere with normal activities.   - MODERATE-SEVERE: Interferes with work, school, sleep, or other activities.      Severe 3. SCRATCHING: "Are there any scratch marks? Bleeding?"     No marks 4. ONSET: "When did this begin?"      4 days 5. CAUSE: "What do you think is causing the itching?" (ask about swimming pools, pollen, animals, soaps, etc.)     unsure 6. OTHER SYMPTOMS: "Do you have any other symptoms?"      none 7. PREGNANCY: "Is there any chance you are pregnant?" "When was your last menstrual period?"  Protocols used: Itching - [facility]

## 2022-10-11 NOTE — Progress Notes (Signed)
Virtual Visit via Video Note  I connected with Amy Hutchinson on 10/11/22 at 11:20 AM EST by a video enabled telemedicine application and verified that I am speaking with the correct person using two identifiers.  Location: Patient: Home Provider: Office  Persons participating in this video call: Nicki Reaper, NP and Amy Hutchinson.   I discussed the limitations of evaluation and management by telemedicine and the availability of in person appointments. The patient expressed understanding and agreed to proceed.  History of Present Illness:  Patient reports generalized itching.  She reports she has not noticed any rash or lesions.  She denies recent changes in diet, medications, soaps, lotions or detergents.  No one in her home has a similar rash.  She has tried Benadryl OTC with some relief of symptoms.  She reports she used to be on a medication for itching but cannot remember the name of it.   Past Medical History:  Diagnosis Date   Anxiety    Back pain    Depression    Hyperlipidemia     Current Outpatient Medications  Medication Sig Dispense Refill   alprazolam (XANAX) 2 MG tablet Take 0.5 tablets (1 mg total) by mouth 4 (four) times daily as needed for sleep or anxiety. 60 tablet 0   citalopram (CELEXA) 40 MG tablet Take 1 tablet (40 mg total) by mouth daily. 30 tablet    cyclobenzaprine (FLEXERIL) 10 MG tablet Take 1 tablet (10 mg total) by mouth 3 (three) times daily as needed for muscle spasms. 60 tablet 2   EPITOL 200 MG tablet Take by mouth.     hydrOXYzine (ATARAX/VISTARIL) 25 MG tablet Take 1 tablet (25 mg total) by mouth 3 (three) times daily as needed. 90 tablet 1   meclizine (ANTIVERT) 25 MG tablet Take 1 tablet (25 mg total) by mouth 3 (three) times daily as needed for dizziness. 90 tablet 2   nicotine (NICODERM CQ) 14 mg/24hr patch Place 1 patch (14 mg total) onto the skin daily. 28 patch 0   predniSONE (DELTASONE) 20 MG tablet Take daily with food. Start with 60mg  (3  pills) x 2 days, then reduce to 40mg  (2 pills) x 2 days, then 20mg  (1 pill) x 3 days 13 tablet 0   QUEtiapine (SEROQUEL) 100 MG tablet Take 1 tablet (100 mg total) by mouth at bedtime. (Patient taking differently: Take 100 mg by mouth 2 (two) times daily.) 30 tablet 2   rosuvastatin (CRESTOR) 10 MG tablet TAKE 1 TABLET BY MOUTH AT BEDTIME -  STOP  SIMVASTATIN 90 tablet 2   No current facility-administered medications for this visit.    Allergies  Allergen Reactions   Codeine Nausea And Vomiting   Darvon [Propoxyphene] Nausea And Vomiting   Dilaudid [Hydromorphone] Nausea And Vomiting   Hydrocodone Nausea And Vomiting   Percocet [Oxycodone-Acetaminophen] Nausea And Vomiting    Family History  Problem Relation Age of Onset   Diabetes Mother    Bipolar disorder Mother    Anxiety disorder Mother    Depression Mother    Lung cancer Mother        lung   Anxiety disorder Sister    Depression Sister    Anxiety disorder Sister    Colon cancer Neg Hx    Breast cancer Neg Hx     Social History   Socioeconomic History   Marital status: Married    Spouse name: Not on file   Number of children: Not on file   Years of  education: High School   Highest education level: High school graduate  Occupational History   Not on file  Tobacco Use   Smoking status: Every Day    Packs/day: 0.40    Years: 32.00    Total pack years: 12.80    Types: Cigarettes   Smokeless tobacco: Never  Vaping Use   Vaping Use: Never used  Substance and Sexual Activity   Alcohol use: Not Currently   Drug use: Never   Sexual activity: Not on file  Other Topics Concern   Not on file  Social History Narrative   Not on file   Social Determinants of Health   Financial Resource Strain: Not on file  Food Insecurity: Not on file  Transportation Needs: Not on file  Physical Activity: Not on file  Stress: Not on file  Social Connections: Not on file  Intimate Partner Violence: Not on file      Constitutional: Denies fever, malaise, fatigue, headache or abrupt weight changes.  HEENT: Denies eye pain, eye redness, ear pain, ringing in the ears, wax buildup, runny nose, nasal congestion, bloody nose, or sore throat. Respiratory: Denies difficulty breathing, shortness of breath, cough or sputum production.   Cardiovascular: Denies chest pain, chest tightness, palpitations or swelling in the hands or feet.  Musculoskeletal: Denies decrease in range of motion, difficulty with gait, muscle pain or joint pain and swelling.  Skin: Patient reports itching.  Denies redness, rashes, lesions or ulcercations.  Neurological: Denies dizziness, difficulty with memory, difficulty with speech or problems with balance and coordination.    No other specific complaints in a complete review of systems (except as listed in HPI above).    Observations/Objective:  Wt Readings from Last 3 Encounters:  09/12/22 167 lb 8 oz (76 kg)  08/23/22 168 lb (76.2 kg)  03/18/22 168 lb 12.8 oz (76.6 kg)    General: Appears her stated age, well developed, well nourished in NAD. Skin: Warm, dry and intact. No rashes noted. Pulmonary/Chest: Normal effort. No respiratory distress.  Neurological: Alert and oriented.   BMET    Component Value Date/Time   NA 141 08/23/2022 1405   K 3.6 08/23/2022 1405   CL 111 08/23/2022 1405   CO2 25 08/23/2022 1405   GLUCOSE 117 (H) 08/23/2022 1405   BUN 19 08/23/2022 1405   CREATININE 0.70 08/23/2022 1405   CREATININE 0.83 03/18/2022 0859   CALCIUM 9.3 08/23/2022 1405   GFRNONAA >60 08/23/2022 1405   GFRNONAA 80 03/05/2021 0802   GFRAA 93 03/05/2021 0802    Lipid Panel     Component Value Date/Time   CHOL 174 03/18/2022 0859   TRIG 181 (H) 03/18/2022 0859   HDL 40 (L) 03/18/2022 0859   CHOLHDL 4.4 03/18/2022 0859   LDLCALC 104 (H) 03/18/2022 0859    CBC    Component Value Date/Time   WBC 11.1 (H) 08/23/2022 1405   RBC 4.57 08/23/2022 1405   HGB 13.2  08/23/2022 1405   HCT 40.7 08/23/2022 1405   PLT 304 08/23/2022 1405   MCV 89.1 08/23/2022 1405   MCH 28.9 08/23/2022 1405   MCHC 32.4 08/23/2022 1405   RDW 13.9 08/23/2022 1405   LYMPHSABS 4.6 (H) 08/23/2022 1405   MONOABS 0.5 08/23/2022 1405   EOSABS 0.2 08/23/2022 1405   BASOSABS 0.1 08/23/2022 1405    Hgb A1C Lab Results  Component Value Date   HGBA1C 5.9 (H) 03/18/2022        Assessment and Plan:  Itching:  Hydroxyzine 25 mg every 8 hours refilled today  Follow-up with your PCP as previously scheduled Follow Up Instructions:    I discussed the assessment and treatment plan with the patient. The patient was provided an opportunity to ask questions and all were answered. The patient agreed with the plan and demonstrated an understanding of the instructions.   The patient was advised to call back or seek an in-person evaluation if the symptoms worsen or if the condition fails to improve as anticipated.    Nicki Reaper, NP

## 2022-10-11 NOTE — Patient Instructions (Signed)
SARNA LOTION  Pruritus Pruritus is an itchy feeling on the skin. One of the most common causes is dry skin, but many different things can cause itching. Most cases of itching do not require medical attention. Sometimes itchy skin can turn into a rash or a secondary infection. Follow these instructions at home: Skin care  Do not use scented soaps, detergents, perfumes, and cosmetic products. Instead, use gentle, unscented versions of these items. Apply moisturizing creams to your skin frequently, at least twice daily. Apply immediately after bathing while skin is still wet. Take medicines or apply medicated creams only as told by your health care provider. This may include: Corticosteroid cream or topical calcineurin inhibitor. Anti-itch lotions containing urea, camphor, or menthol. Oral antihistamines. Do not take hot showers or baths, which can make itching worse. A short, cool shower may help with itching as long as you apply moisturizing lotion after the shower. Apply a cool, wet cloth (cool compress) to the affected areas. You may take lukewarm baths with one of the following: Epsom salts. You can get these at your local pharmacy or grocery store. Follow the instructions on the packaging. Baking soda. Pour a small amount into the bath as told by your health care provider. Colloidal oatmeal. You can get this at your local pharmacy or grocery store. Follow the instructions on the packaging. Do not scratch your skin. General instructions Avoid wearing tight clothes. Keep a journal to help find out what is causing your itching. Write down: What you eat and drink. What cosmetic products you use. What soaps or detergents you use. What you wear, including jewelry. Use a humidifier. This keeps the air moist, which helps to prevent dry skin. Be aware of any changes in your itchiness. Tell your health care provider about any changes. Contact a health care provider if: The itching does not go  away after several days. You notice redness, warmth, or drainage on the skin where you have scratched. You are unusually thirsty or urinating more than normal. Your skin tingles or feels numb. Your skin or the white parts of your eyes turn yellow (jaundice). You feel weak. You have any of the following: Night sweats. Tiredness (fatigue). Weight loss. Abdominal pain. Summary Pruritus is an itchy feeling on the skin. One of the most common causes is dry skin, but many different conditions and factors can cause itching. Apply moisturizing creams to your skin frequently, at least twice daily. Apply immediately after bathing while skin is still wet. Take medicines or apply medicated creams only as told by your health care provider. Do not take hot showers or baths. Do not use scented soaps, detergents, perfumes, or cosmetic products. Keep a journal to help find out what is causing your itching. This information is not intended to replace advice given to you by your health care provider. Make sure you discuss any questions you have with your health care provider. Document Revised: 12/26/2021 Document Reviewed: 12/26/2021 Elsevier Patient Education  2023 Else[employer] Inc[employer]  

## 2022-10-21 ENCOUNTER — Ambulatory Visit: Payer: BC Managed Care – PPO | Admitting: Family Medicine

## 2022-10-21 ENCOUNTER — Ambulatory Visit: Payer: Self-pay

## 2022-10-21 NOTE — Telephone Encounter (Signed)
  Chief Complaint: lump near ear Symptoms: R ear pain, lump on face near R ear painful and swelling  Frequency: this morning noticed lump Pertinent Negatives: Patient denies drainage from R ear Disposition: [] ED /[] Urgent Care (no appt availability in office) / [x] Appointment(In office/virtual)/ []  [facility]/ [] Home Care/ [] Refused Recommended Disposition /[] [facility]/ []  Follow-up with PCP Additional Notes: pt states she noticed it and unsure if sinus infection or what but took some Tylenol and helped slightly with pain. Scheduled pt OV appt today at 1440 with Dr. .   Summary: lump on right side of face under ear   Pt called in for assistance. Pt says that she woke up to a lump on the right side of her face under her ear. Pt says that she feel like she may have a ear infection. Pt would like to discuss further. Showing next opening on 10/28/22.         Reason for Disposition  [1] Swelling is painful to touch AND [2] no fever  Answer Assessment - Initial Assessment Questions 1. APPEARANCE of SWELLING: "What does it look like?"     Small lump 2. SIZE: "How large is the swelling?" (e.g., inches, cm; or compare to size of pinhead, tip of pen, eraser, coin, pea, grape, ping pong ball)      Unsure how big  3. LOCATION: "Where is the swelling located?"     On face near R ear  4. ONSET: "When did the swelling start?"     This morning  6. PAIN: "Is there any pain?" If Yes, ask: "How bad is the pain?" (e.g., scale 1-10; or mild, moderate, severe)     - NONE (0): no pain   - MILD (1-3): doesn't interfere with normal activities    - MODERATE (4-7): interferes with normal activities or awakens from sleep    - SEVERE (8-10): excruciating pain, unable to do any normal activities     Mild to moderate 8. CAUSE: "What do you think caused the swelling?" 9 OTHER SYMPTOMS: "Do you have any other symptoms?" (e.g., fever)     Sinus pressure, congestion  Protocols used: Skin  Lump or Localized Swelling-A-AH

## 2022-10-23 ENCOUNTER — Ambulatory Visit: Payer: BC Managed Care – PPO | Admitting: Family Medicine

## 2022-10-23 ENCOUNTER — Ambulatory Visit: Payer: Self-pay | Admitting: *Deleted

## 2022-10-23 NOTE — Telephone Encounter (Signed)
Summary: vomiting w/ diarrhea   Pt states she has been vomiting w/ diarrhea all night  Pt requesting a cb         Reason for Disposition  MILD-MODERATE diarrhea (e.g., 1-6 times / day more than normal)  Answer Assessment - Initial Assessment Questions 1. DIARRHEA SEVERITY: "How bad is the diarrhea?" "How many more stools have you had in the past 24 hours than normal?"    - NO DIARRHEA (SCALE 0)   - MILD (SCALE 1-3): Few loose or mushy BMs; increase of 1-3 stools over normal daily number of stools; mild increase in ostomy output.   -  MODERATE (SCALE 4-7): Increase of 4-6 stools daily over normal; moderate increase in ostomy output.   -  SEVERE (SCALE 8-10; OR "WORST POSSIBLE"): Increase of 7 or more stools daily over normal; moderate increase in ostomy output; incontinence.     mild 2. ONSET: "When did the diarrhea begin?"      Early am 3. BM CONSISTENCY: "How loose or watery is the diarrhea?"      *No Answer* 4. VOMITING: "Are you also vomiting?" If Yes, ask: "How many times in the past 24 hours?"      once 5. ABDOMEN PAIN: "Are you having any abdomen pain?" If Yes, ask: "What does it feel like?" (e.g., crampy, dull, intermittent, constant)      cramping 6. ABDOMEN PAIN SEVERITY: If present, ask: "How bad is the pain?"  (e.g., Scale 1-10; mild, moderate, or severe)   - MILD (1-3): doesn't interfere with normal activities, abdomen soft and not tender to touch    - MODERATE (4-7): interferes with normal activities or awakens from sleep, abdomen tender to touch    - SEVERE (8-10): excruciating pain, doubled over, unable to do any normal activities       mild 7. ORAL INTAKE: If vomiting, "Have you been able to drink liquids?" "How much liquids have you had in the past 24 hours?"     Yes- drinking vitamin 8. HYDRATION: "Any signs of dehydration?" (e.g., dry mouth [not just dry lips], too weak to stand, dizziness, new weight loss) "When did you last urinate?"     no 9. EXPOSURE: "Have  you traveled to a foreign country recently?" "Have you been exposed to anyone with diarrhea?" "Could you have eaten any food that was spoiled?"     Husband works around a lot of people 10. ANTIBIOTIC USE: "Are you taking antibiotics now or have you taken antibiotics in the past 2 months?"       no 11. OTHER SYMPTOMS: "Do you have any other symptoms?" (e.g., fever, blood in stool)       no 12. PREGNANCY: "Is there any chance you are pregnant?" "When was your last menstrual period?"       na  Protocols used: [facility]

## 2022-10-23 NOTE — Telephone Encounter (Signed)
Patient in bathroom- husband answering triage call Chief Complaint: new onset diarrhea, vomiting Symptoms: diarrhea- started last night- mild Frequency: mild diarrhea, vomited once Pertinent Negatives: Patient denies fever Disposition: [] ED /[] Urgent Care (no appt availability in office) / [] Appointment(In office/virtual)/ []  [facility]/ [x] Home Care/ [] Refused Recommended Disposition /[] [place] [facility]/ []  Follow-up with PCP Additional Notes: Home care advise given- diet, fluids, antidiarrheal medications- patient advised call back if not improving throughout the day or getting worse.

## 2022-11-10 ENCOUNTER — Emergency Department: Payer: BC Managed Care – PPO

## 2022-11-10 ENCOUNTER — Other Ambulatory Visit: Payer: Self-pay

## 2022-11-10 ENCOUNTER — Emergency Department
Admission: EM | Admit: 2022-11-10 | Discharge: 2022-11-10 | Disposition: A | Discharge status: Home / Self Care | Payer: BC Managed Care – PPO | Attending: Emergency Medicine | Admitting: Emergency Medicine

## 2022-11-10 DIAGNOSIS — G8929 Other chronic pain: Secondary | ICD-10-CM | POA: Insufficient documentation

## 2022-11-10 DIAGNOSIS — Y92002 Bathroom of unspecified non-institutional (private) residence single-family (private) house as the place of occurrence of the external cause: Secondary | ICD-10-CM | POA: Diagnosis not present

## 2022-11-10 DIAGNOSIS — S3992XA Unspecified injury of lower back, initial encounter: Secondary | ICD-10-CM | POA: Diagnosis not present

## 2022-11-10 DIAGNOSIS — I959 Hypotension, unspecified: Secondary | ICD-10-CM | POA: Diagnosis not present

## 2022-11-10 DIAGNOSIS — W182XXA Fall in (into) shower or empty bathtub, initial encounter: Secondary | ICD-10-CM | POA: Diagnosis not present

## 2022-11-10 DIAGNOSIS — I700 Atherosclerosis of aorta: Secondary | ICD-10-CM | POA: Diagnosis not present

## 2022-11-10 DIAGNOSIS — M549 Dorsalgia, unspecified: Secondary | ICD-10-CM | POA: Diagnosis not present

## 2022-11-10 DIAGNOSIS — R519 Headache, unspecified: Secondary | ICD-10-CM | POA: Diagnosis not present

## 2022-11-10 DIAGNOSIS — M542 Cervicalgia: Secondary | ICD-10-CM | POA: Diagnosis not present

## 2022-11-10 DIAGNOSIS — M545 Low back pain, unspecified: Secondary | ICD-10-CM | POA: Insufficient documentation

## 2022-11-10 DIAGNOSIS — M48061 Spinal stenosis, lumbar region without neurogenic claudication: Secondary | ICD-10-CM | POA: Diagnosis not present

## 2022-11-10 DIAGNOSIS — W19XXXA Unspecified fall, initial encounter: Secondary | ICD-10-CM

## 2022-11-10 MED ORDER — NAPROXEN 500 MG PO TABS: 500.0000 mg | ORAL_TABLET | Freq: Two times a day (BID) | ORAL | 0 refills | Status: AC

## 2022-11-10 MED ORDER — ACETAMINOPHEN 325 MG PO TABS
650.0000 mg | ORAL_TABLET | Freq: Once | ORAL | Status: DC
  Filled 2022-11-10: qty 2

## 2022-11-10 MED ORDER — LIDOCAINE 5 % EX PTCH: 1.0000 | MEDICATED_PATCH | Freq: Two times a day (BID) | CUTANEOUS | 0 refills | Status: AC

## 2022-11-10 MED ORDER — KETOROLAC TROMETHAMINE 15 MG/ML IJ SOLN
15.0000 mg | Freq: Once | INTRAMUSCULAR | Status: AC
  Administered 2022-11-10: 15 mg via INTRAMUSCULAR
  Filled 2022-11-10: qty 1

## 2022-11-10 MED ORDER — LIDOCAINE 5 % EX PTCH
1.0000 | MEDICATED_PATCH | CUTANEOUS | Status: DC
  Administered 2022-11-10: 1 via TRANSDERMAL
  Filled 2022-11-10: qty 1

## 2022-11-10 NOTE — ED Triage Notes (Signed)
First Nurse:  [facility] reports pt slipped in the bathroom on the rug. Pt has pain to the lower back and neck. EMS reports pain to the left thigh, without deformity. No blood thinners and no loss of consciousness.  Pt placed in c-collar, and has history of previous neck surgery   EMS 140/82 86 HR 99% RA

## 2022-11-10 NOTE — ED Triage Notes (Signed)
Pt arrived via EMS from home. Please read previous nurse note on 11/10/2022 at 1258.

## 2022-11-10 NOTE — ED Provider Notes (Signed)
[facility] Provider Note    Event Date/Time   First MD Initiated Contact with Patient 11/10/22 1338     (approximate)   History   Fall   HPI  Amy Hutchinson is a 61 y.o. female with a past medical history of depression, anxiety, hyperlipidemia who presents today for evaluation after a fall.  Patient reports that she was in the bathroom and she slipped on the ground and she fell backwards.  She is unsure if she struck her head.  There was no loss of consciousness.  She has not had any nausea or vomiting.  She reports that she has had low back pain for quite some time, but she feels that the fall made it worse.  She denies numbness or tingling in her extremities.  She denies weakness different from her baseline in her lower extremities.  She has not had any urinary or fecal incontinence or retention.  No vomiting, vision changes, or current headache.  She has been able to ambulate since the fall.  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      Excl. in [place]?     Most recent vital signs: Vitals:   11/10/22 1304  BP: 120/88  Pulse: 76  Resp: 18  Temp: 98.2 F (36.8 C)  SpO2: 96%    Physical  Exam Vitals and nursing note reviewed.  Constitutional:      General: Awake and alert. No acute distress.    Appearance: Normal appearance. The patient is normal weight.  HENT:     Head: Normocephalic and atraumatic.     Mouth: Mucous membranes are moist.  Eyes:     General: PERRL. Normal EOMs        Right eye: No discharge.        Left eye: No discharge.     Conjunctiva/sclera: Conjunctivae normal.  Cardiovascular:     Rate and Rhythm: Normal rate and regular rhythm.     Pulses: Normal pulses.     Heart sounds: Normal heart sounds Pulmonary:     Effort: Pulmonary effort is normal. No respiratory distress.     Breath sounds: Normal breath sounds.  No ecchymosis Abdominal:     Abdomen is soft. There is no abdominal tenderness. No rebound or guarding. No distention.  No ecchymosis Musculoskeletal:        General: No swelling. Normal range of motion.     Cervical back: Normal  range of motion and neck supple.  In cervical collar.  No midline cervical spine tenderness.   Normal strength and sensation in bilateral upper extremities. Normal grip strength bilaterally.  Normal intrinsic muscle function of the hand bilaterally.  Normal radial pulses bilaterally. Back: Diffuse lumbar paraspinal muscle tenderness without specific midline tenderness. Strength and sensation 5/5 to bilateral lower extremities. Normal great toe extension against resistance. Normal sensation throughout feet. Normal patellar reflexes. Negative SLR and opposite SLR bilaterally.  Pelvis stable.  Negative logroll bilaterally.  Able to actively lift both legs off of the bed. Skin:    General: Skin is warm and dry.     Capillary Refill: Capillary refill takes less than 2 seconds.     Findings: No rash.  Neurological:     Mental Status: The patient is awake and alert.  Neurological: GCS 15 alert and oriented x3 Normal speech, no expressive or receptive aphasia or dysarthria Cranial nerves II through XII intact Normal  visual fields 5 out of 5 strength in all 4 extremities with intact sensation throughout No extremity drift Normal finger-to-nose testing, no limb or truncal ataxia     ED Results / Procedures / Treatments   Labs (all labs ordered are listed, but only abnormal results are displayed) Labs Reviewed - No data to display   EKG     RADIOLOGY I independently reviewed and interpreted imaging and agree with radiologists findings.     PROCEDURES:  Critical Care performed:   Procedures   MEDICATIONS ORDERED IN ED: Medications  acetaminophen (TYLENOL) tablet 650 mg (650 mg Oral Not Given 11/10/22 1427)  lidocaine (LIDODERM) 5 % 1 patch (1 patch Transdermal Patch Applied 11/10/22 1428)  ketorolac (TORADOL) 15 MG/ML injection 15 mg (15 mg Intramuscular Given 11/10/22 1428)     IMPRESSION / MDM / ASSESSMENT AND PLAN / ED COURSE  I reviewed the triage vital signs and the nursing notes.   Differential diagnosis includes, but is not limited to, contusion, fracture, dislocation, radiculopathy, intracranial hemorrhage, cervical spine injury.  Patient is awake and alert, hemodynamically stable and afebrile.  She has no focal neurological deficits.  She has not a cervical collar on arrival.  CT head and neck and lumbar spine obtained are reassuring against any acute injury.  She does have multilevel disc herniation noted in her low back which patient reports that she is aware of.  She has normal strength and sensation in bilateral lower extremities, no signs or symptoms of cord compression.  She also has normal strength and sensation of her bilateral upper extremities with normal intrinsic muscle strength of her hands, do not suspect central cord syndrome.  She was treated symptomatically with Toradol and Lidoderm patches with significant improvement of her symptoms.  She was subsequently given prescriptions for naproxen and Lidoderm patches to continue symptomatic management at home.  She has  no chest pain, shortness of breath, or abdominal pain, and has no chest or abdominal ecchymosis to suggest intra-abdominal injury or thoracic injury.  We discussed all findings including incidental findings on CT L-spine.  She understands to follow-up closely with her outpatient provider.  She also understands return precautions.  Patient was discharged in stable condition.   Patient's presentation is most consistent with acute complicated illness / injury requiring diagnostic workup.    FINAL CLINICAL IMPRESSION(S) / ED DIAGNOSES   Final diagnoses:  Fall, initial encounter  Chronic bilateral low back pain without sciatica     Rx / DC Orders   ED  Discharge Orders          Ordered    lidocaine (LIDODERM) 5 %  Every 12 hours        11/10/22 1457    naproxen (NAPROSYN) 500 MG tablet  2 times daily with meals        11/10/22 1457             Note:  This document was prepared using Dragon voice recognition software and may include unintentional dictation errors.   Jackelyn Hoehn, PA-C 11/10/22 1504    Jene Every, MD 11/10/22 [phone]

## 2022-11-10 NOTE — Discharge Instructions (Signed)
Your CT head and neck and back did not reveal any acute injuries, however you have disc herniations in your lumbar spine.  Please follow-up with the neurosurgeon on-call.  Please return for any new, worsening, or changes symptoms or other concerns.  You may take the medications as prescribed to help with your pain. Please return to the emergency department for any new, worsening, or changing symptoms or other concerns including weakness in your legs, urinary or stool incontinence or retention, numbness or tingling in your extremities/buttocks/groin, fevers, or any other concerns or change in symptoms.

## 2022-11-18 NOTE — Progress Notes (Signed)
Referring Physician:  Smitty Cords, DO [address] [place],  [place] [address]  Primary Physician:  Smitty Cords, DO  History of Present Illness: 11/19/2022 Amy Hutchinson has a history of insomnia, GAD, depression, hyperlipidemia.   Seen in ED on 11/10/22- history of chronic LBP with fall on 11/10/22 that made her LBP worse.   She has constant LBP with bilateral constant leg pain (entire leg) to her feet. She has numbness, tingling, and burning in her legs. LBP > leg pain. Right leg pain = left leg pain. No specific aggravating factors. She has weakness in both legs.   She feels like her lower back pain radiates up to her mid back and to her neck. She has intermittent numbness/tingling in both hands. No arm pain. She notes some weakness in arms (has to be careful when drinking coffee). No issues with dexterity. She notes issues with her balance that has gotten worse.   Given naproxen and lidoderm patches in ED. She does not tolerate medications well and has multiple allergies.   Bowel/Bladder Dysfunction: none  Conservative measures:  Physical therapy: none, some relief with chiropractor Multimodal medical therapy including regular antiinflammatories: tylenol, lidoderm patches, naproxen Injections: previous epidural steroid injection years ago in [place] with improvement  Past Surgery: no lumbar surgery. History of cervical surgery years ago (posterior).   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   The symptoms are causing a significant impact on the patient's life.   Review of Systems:  A 10 point review of systems is negative, except for the pertinent positives and negatives detailed in the HPI.  Past Medical History: Past Medical History:  Diagnosis Date   Anxiety    Back pain    Depression    Hyperlipidemia     Past Surgical History: Past Surgical History:  Procedure Laterality Date    ABDOMINAL HYSTERECTOMY     BIOPSY BREAST Left 2021   bx coil clip   BRAIN SURGERY     NECK SURGERY     TONSILLECTOMY AND ADENOIDECTOMY      Allergies: Allergies as of 11/19/2022 - Review Complete 11/19/2022  Allergen Reaction Noted   Codeine Nausea And Vomiting 08/23/2022   Darvon [propoxyphene] Nausea And Vomiting 06/27/2020   Dilaudid [hydromorphone] Nausea And Vomiting 06/27/2020   Hydrocodone Nausea And Vomiting 02/26/2021   Percocet [oxycodone-acetaminophen] Nausea And Vomiting 06/27/2020    Medications: Outpatient Encounter Medications as of 11/19/2022  Medication Sig   alprazolam (XANAX) 2 MG tablet Take 0.5 tablets (1 mg total) by mouth 4 (four) times daily as needed for sleep or anxiety.   citalopram (CELEXA) 40 MG tablet Take 1 tablet (40 mg total) by mouth daily.   EPITOL 200 MG tablet Take by mouth.   hydrOXYzine (ATARAX) 25 MG tablet Take 1 tablet (25 mg total) by mouth 3 (three) times daily as needed. (Patient not taking: Reported on 11/19/2022)   meclizine (ANTIVERT) 25 MG tablet Take 1 tablet (25 mg total) by mouth 3 (three) times daily as needed for dizziness.   QUEtiapine (SEROQUEL) 100 MG tablet Take 1 tablet (100 mg total) by mouth at bedtime. (Patient taking differently: Take 100 mg by mouth 2 (two) times daily.)   rosuvastatin (CRESTOR) 10 MG tablet TAKE 1 TABLET BY MOUTH AT BEDTIME -  STOP  SIMVASTATIN   [DISCONTINUED] cyclobenzaprine (FLEXERIL) 10 MG tablet Take 1 tablet (10 mg total) by mouth 3 (three) times daily as needed  for muscle spasms. (Patient not taking: Reported on 10/11/2022)   [DISCONTINUED] nicotine (NICODERM CQ) 14 mg/24hr patch Place 1 patch (14 mg total) onto the skin daily. (Patient not taking: Reported on 11/19/2022)   [DISCONTINUED] predniSONE (DELTASONE) 20 MG tablet Take daily with food. Start with 60mg  (3 pills) x 2 days, then reduce to 40mg  (2 pills) x 2 days, then 20mg  (1 pill) x 3 days (Patient not taking: Reported on 11/19/2022)   No  facility-administered encounter medications on file as of 11/19/2022.    Social History: Social History   Tobacco Use   Smoking status: Every Day    Packs/day: 0.40    Years: 32.00    Total pack years: 12.80    Types: Cigarettes   Smokeless tobacco: Never  Vaping Use   Vaping Use: Never used  Substance Use Topics   Alcohol use: Not Currently   Drug use: Never    Family Medical History: Family History  Problem Relation Age of Onset   Diabetes Mother    Bipolar disorder Mother    Anxiety disorder Mother    Depression Mother    Lung cancer Mother        lung   Anxiety disorder Sister    Depression Sister    Anxiety disorder Sister    Colon cancer Neg Hx    Breast cancer Neg Hx     Physical Examination: Vitals:   11/19/22 1413  BP: 124/74    General: Patient is well developed, well nourished, calm, collected, and in no apparent distress. Attention to examination is appropriate.  Respiratory: Patient is breathing without any difficulty.   NEUROLOGICAL:     Awake, alert, oriented to person, place, and time.  Speech is clear and fluent. Fund of knowledge is appropriate.   Cranial Nerves: Pupils equal round and reactive to light.  Facial tone is symmetric.  Facial sensation is symmetric.  She has diffuse cervical and lumbar tenderness- she jumps when I palpate these areas.   No abnormal lesions on exposed skin.   Strength: Side Biceps Triceps Deltoid Interossei Grip Wrist Ext. Wrist Flex.  R +4 +4 5 5 5 5 5   L +4 +4 5 5 5 5 5    Side Iliopsoas Quads Hamstring PF DF EHL  R 5 5 5 5 5 5   L 5 5 5 5 5 5    She does not give a good effort in upper extremity strength testing.   She does not give a good effort in lower extremity strength testing, but no gross weakness is noted.   Reflexes are 2+ and symmetric at the biceps, triceps, brachioradialis, patella and achilles.   Hoffman's is absent.  She has 1-2 beats clonus in both feet.  Bilateral upper and lower  extremity sensation is intact to light touch.     She has slow gait and walks on the balls of her feet.   Medical Decision Making  Imaging: CT lumbar spine dated 11/10/22:  FINDINGS: Segmentation: 5 lumbar type vertebrae.   Alignment: Normal.   Vertebrae: No acute fracture or focal pathologic process.   Paraspinal and other soft tissues: Calcifications of the abdominal aorta. There is a duodenal diverticulum at the D2/D3 junction. There is a nonspecific 1.2 cm structure at the level of the celiac axis (series 4, image 6), which is low density.   Disc levels: Redemonstrated are disc bulges at L2-L3, L3-L4, L4-L5, and L5-S1, as seen on 07/09/2021 lumbar spine MRI. There is mild spinal canal narrowing  at L2-L3, L3-L4, and L4-L5. There is no evidence of high-grade neural foraminal stenosis.   IMPRESSION: 1.  No acute fracture or traumatic malalignment of the lumbar spine.   2. Nonspecific 1.2 cm structure along the superior aspect of the celiac artery. This could represent a lymph node or a mesenteric vein. Recommend further evaluation with a contrast enhanaced CT of the abdomen.     Electronically Signed   By: Lorenza Cambridge M.D.   On: 11/10/2022 14:48  I have personally reviewed the images and agree with the above interpretation.  Assessment and Plan: Amy Hutchinson is a pleasant 61 y.o. female with history of chronic LBP with fall on 11/10/22 that made her LBP worse.   She has constant LBP with bilateral constant leg pain (entire leg) to her feet. LBP > leg pain. Right leg pain = left leg pain. She has weakness in both legs.   She feels like her lower back pain radiates up to her mid back and to her neck. She has intermittent numbness/tingling in both hands. No arm pain. She notes some weakness in arms.   CT scan from ED shows diffuse lumbar spondylosis with possible mild central stenosis L2-L5.   She has balance issues and frequent falling. No dexterity issues, but she has  weakness in her arms along with 1-2 beats of clonus.   Treatment options discussed with patient and following plan made:   - MRI of lumbar spine to further evaluate lumbar radiculopathy. CT findings to not correlate with symptoms.  - MRI of cervical spine to evaluate for cervical stenosis. She has balance issues with frequent falling along with weakness in both arms and 1-2 beats of clonus on exam.  - Discussed medications. She is allergic to multiple medications and does not tolerate them well. Will hold off for now.  - CT also showed nonspecific 1.2 cm structure along the superior aspect of the celiac artery. This could represent a lymph node or a mesenteric vein. Recommend further evaluation with a contrast enhanaced CT of the abdomen. Discussed this with patient and she will f/u with PCP regarding this. Message sent to her PCP.  - Once I have her MRI results, will set up follow up with me in clinic to review.   I spent a total of 45 minutes in face-to-face and non-face-to-face activities related to this patient's care today including review of outside records, review of imaging, review of symptoms, physical exam, discussion of differential diagnosis, discussion of treatment options, and documentation.   Thank you for involving me in the care of this patient.   Drake Leach PA-C Dept. of Neurosurgery

## 2022-11-19 ENCOUNTER — Encounter: Payer: Self-pay | Admitting: Orthopedic Surgery

## 2022-11-19 ENCOUNTER — Ambulatory Visit: Payer: BC Managed Care – PPO | Admitting: Orthopedic Surgery

## 2022-11-19 VITALS — BP 124/74 | Ht 64.0 in | Wt 164.8 lb

## 2022-11-19 DIAGNOSIS — M4726 Other spondylosis with radiculopathy, lumbar region: Secondary | ICD-10-CM | POA: Diagnosis not present

## 2022-11-19 DIAGNOSIS — M5416 Radiculopathy, lumbar region: Secondary | ICD-10-CM

## 2022-11-19 DIAGNOSIS — R2689 Other abnormalities of gait and mobility: Secondary | ICD-10-CM | POA: Diagnosis not present

## 2022-11-19 DIAGNOSIS — W19XXXA Unspecified fall, initial encounter: Secondary | ICD-10-CM | POA: Diagnosis not present

## 2022-11-19 DIAGNOSIS — M47816 Spondylosis without myelopathy or radiculopathy, lumbar region: Secondary | ICD-10-CM

## 2022-11-19 DIAGNOSIS — R29898 Other symptoms and signs involving the musculoskeletal system: Secondary | ICD-10-CM

## 2022-11-19 NOTE — Patient Instructions (Signed)
It was so nice to see you today, I am sorry that you are hurting so much.   I want to get an MRI of both your neck and your lower back. You can have this done at [facility] in [place]. They will call you to schedule the appointment.   Please call/message to let us know once the MRIs are complete so we can be sure to get the report and the images.   Once I have the results, we can setup a follow up for you to come in to review them.   Please do not hesitate to call if you have any questions or concerns. You can also message me in MyChart.   If you have not heard back about any of the tests/procedures in the next week, please call the office so we can help you get these things scheduled.   Drake Leach PA-C [phone]

## 2022-11-20 ENCOUNTER — Ambulatory Visit: Payer: BC Managed Care – PPO | Admitting: Orthopedic Surgery

## 2022-12-06 ENCOUNTER — Telehealth: Payer: Self-pay

## 2022-12-06 DIAGNOSIS — F411 Generalized anxiety disorder: Secondary | ICD-10-CM | POA: Diagnosis not present

## 2022-12-06 DIAGNOSIS — F410 Panic disorder [episodic paroxysmal anxiety] without agoraphobia: Secondary | ICD-10-CM | POA: Diagnosis not present

## 2022-12-06 DIAGNOSIS — F3340 Major depressive disorder, recurrent, in remission, unspecified: Secondary | ICD-10-CM | POA: Diagnosis not present

## 2022-12-06 NOTE — Telephone Encounter (Signed)
Copied from [place] [phone]. Topic: Referral - Question >> Dec 06, 2022 11:51 AM Oley Balm A wrote: Reason for CRM: Patient states that she did receive the referral for Neurology for her back pain, per patient she has been in the bed for 4-5 weeks due to the back pain. She was told that her insurance would cover her MRI and was just called from the doctors office where the referral was sent to and she has to come up with $300 for one and $3000 for the other. Per patient she does not have that type of money and is needing help. Per pt she is suppose to have both MRI's on 12/10/22 and is needing help before then.  Please advise

## 2022-12-06 NOTE — Telephone Encounter (Signed)
Please call them back and follow up.  I reviewed this triage message, but I am not sure how I can help or what she is requesting.  The management from her Neurologist with imaging MRI is not something that I can change.  Also regarding cost of the test, I do not know what else to do about that unfortunately.  Nobie Putnam, [facility] 12/06/2022, 1:58 PM

## 2022-12-31 ENCOUNTER — Encounter: Payer: Self-pay | Admitting: Family Medicine

## 2022-12-31 ENCOUNTER — Ambulatory Visit (INDEPENDENT_AMBULATORY_CARE_PROVIDER_SITE_OTHER): Payer: BC Managed Care – PPO | Admitting: Family Medicine

## 2022-12-31 VITALS — BP 124/62 | HR 78 | Temp 99.4°F | Ht 64.0 in | Wt 168.0 lb

## 2022-12-31 DIAGNOSIS — J019 Acute sinusitis, unspecified: Secondary | ICD-10-CM | POA: Diagnosis not present

## 2022-12-31 DIAGNOSIS — J029 Acute pharyngitis, unspecified: Secondary | ICD-10-CM

## 2022-12-31 DIAGNOSIS — L299 Pruritus, unspecified: Secondary | ICD-10-CM

## 2022-12-31 DIAGNOSIS — J3089 Other allergic rhinitis: Secondary | ICD-10-CM

## 2022-12-31 LAB — POC INFLUENZA A&B (BINAX/QUICKVUE)
Influenza A, POC: NEGATIVE
Influenza B, POC: NEGATIVE

## 2022-12-31 LAB — POC COVID19 BINAXNOW: SARS Coronavirus 2 Ag: NEGATIVE

## 2022-12-31 MED ORDER — FLUTICASONE PROPIONATE 50 MCG/ACT NA SUSP: 2.0000 | Freq: Every day | NASAL | 3 refills | Status: DC

## 2022-12-31 MED ORDER — HYDROXYZINE HCL 50 MG PO TABS: 50.0000 mg | ORAL_TABLET | Freq: Three times a day (TID) | ORAL | 3 refills | Status: DC | PRN

## 2022-12-31 NOTE — Patient Instructions (Addendum)
Thank you for coming to the office today.  Referral to Allergist for further management and testing on the chronic itching. May need blood or skin testing to determine any potential allergic cause of your symptoms  [facility] [place] at [address] [place],  [place]  [address] Main: [phone] Fax: [phone]  For sinuses Likely allergic or early viral course  Start nasal steroid Flonase 2 sprays in each nostril daily for 4-6 weeks, may repeat course seasonally or as needed Use Nasal saline 5-10 min after to flush  Inc dose Hydroxyzine from 25 to 50mg  tablets, if too strong sedating can take HALF or notify me to change order. May try half AM and half afternoon and whole 50mg  in PM.   Please schedule a Follow-up Appointment to: Return if symptoms worsen or fail to improve.  If you have any other questions or concerns, please feel free to call the office or send a message through [place]. You may also schedule an earlier appointment if necessary.  Additionally, you may be receiving a survey about your experience at our office within a few days to 1 week by e-mail or mail. We value your feedback.  Nobie Putnam, DO [place]

## 2022-12-31 NOTE — Progress Notes (Signed)
Subjective:    Patient ID: Amy Hutchinson, female    DOB: [birth year], 63 y.o.   MRN: [id number]  Amy Hutchinson is a 62 y.o. 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 No rash or other concern but just itching all over. Has episodic itching flares, was hydroxyzine AS NEEDED and still having itching, so she switched to benadryl. The hydroxyzine 25mg  used to help more now less effective. She uses clear and free detergent no other potential triggers Has not seen Allergist She is on Xyzal regularly but not helping itching.  Additional allergy sinus symptoms now Recent symptoms onset within 24 hours Sore throat, drainage No sick contact      09/12/2022   11:11 AM 03/18/2022    8:25 AM 02/11/2022    2:31 PM  Depression screen PHQ 2/9  Decreased Interest 0 0 1  Down, Depressed, Hopeless 0 0 0  PHQ - 2 Score 0 0 1  Altered sleeping 0 0 1  Tired, decreased energy 0 0 0  Change in appetite 0 0 0  Feeling bad or failure about yourself  0 0 0  Trouble concentrating 0 0 0  Moving slowly or fidgety/restless 0 0 0  Suicidal thoughts 0 0 0  PHQ-9 Score 0 0 2  Difficult doing work/chores Not difficult at all Not difficult at all Not difficult at all    Social History   Tobacco Use   Smoking status: Every Day    Packs/day: 0.40    Years: 32.00    Total pack years: 12.80    Types: Cigarettes   Smokeless tobacco: Never  Vaping Use   Vaping Use: Never used  Substance Use Topics   Alcohol use: Not Currently   Drug use: Never    Review of Systems Per HPI unless specifically indicated above     Objective:    BP 124/62   Pulse 78   Temp 99.4 F (37.4 C) (Oral)   Ht 5\' 4"  (1.626 m)   Wt 168 lb (76.2 kg)   SpO2 99%   BMI 28.84 kg/m   Wt Readings from Last 3 Encounters:  12/31/22 168 lb (76.2 kg)  11/19/22 164 lb 12.8 oz (74.8 kg)  11/10/22 166 lb 14.2 oz (75.7 kg)    Physical Exam Vitals and  nursing note reviewed.  Constitutional:      General: She is not in acute distress.    Appearance: She is well-developed. She is not diaphoretic.     Comments: Well-appearing, comfortable, cooperative  HENT:     Head: Normocephalic and atraumatic.     Right Ear: Tympanic membrane, ear canal and external ear normal. There is no impacted cerumen.     Left Ear: Tympanic membrane, ear canal and external ear normal. There is no impacted cerumen.     Ears:     Comments: Minimal fullness behind TM    Nose: Congestion present.     Mouth/Throat:     Mouth: Mucous membranes are moist.     Pharynx: No oropharyngeal exudate or posterior oropharyngeal erythema.  Eyes:     General:        Right eye: No discharge.        Left eye: No discharge.     Conjunctiva/sclera: Conjunctivae normal.  Neck:     Thyroid: No thyromegaly.  Cardiovascular:     Rate and Rhythm: Normal rate and regular rhythm.  Heart sounds: Normal heart sounds. No murmur heard. Pulmonary:     Effort: Pulmonary effort is normal. No respiratory distress.     Breath sounds: Normal breath sounds. No wheezing or rales.  Musculoskeletal:        General: Normal range of motion.     Cervical back: Normal range of motion and neck supple.  Lymphadenopathy:     Cervical: No cervical adenopathy.  Skin:    General: Skin is warm and dry.     Findings: No erythema or rash.  Neurological:     Mental Status: She is alert and oriented to person, place, and time.  Psychiatric:        Behavior: Behavior normal.     Comments: Well groomed, good eye contact, normal speech and thoughts    Results for orders placed or performed in visit on 12/31/22  POC COVID-19  Result Value Ref Range   SARS Coronavirus 2 Ag Negative Negative  POC Influenza A&B (Binax test)  Result Value Ref Range   Influenza A, POC Negative Negative   Influenza B, POC Negative Negative      Assessment & Plan:   Problem List Items Addressed This Visit      Pruritus - Primary   Relevant Medications   hydrOXYzine (ATARAX) 50 MG tablet   Other Visit Diagnoses     Sore throat       Relevant Orders   POC COVID-19 (Completed)   POC Influenza A&B (Binax test) (Completed)   Acute rhinosinusitis       Relevant Medications   levocetirizine (XYZAL) 5 MG tablet   fluticasone (FLONASE) 50 MCG/ACT nasal spray       Referral to Allergist for further management and testing on the chronic itching. May need blood or skin testing to determine any potential allergic cause of your symptoms  [facility] at [address] [place],  [place]  [address] [address]: [phone] Fax: [phone]  For sinuses Likely allergic or early viral course  Start nasal steroid Flonase 2 sprays in each nostril daily for 4-6 weeks, may repeat course seasonally or as needed Use Nasal saline 5-10 min after to flush  Inc dose Hydroxyzine from 25 to 50mg  tablets, if too strong sedating can take HALF or notify me to change order. May try half AM and half afternoon and whole 50mg  in PM.  Orders Placed This Encounter  Procedures   Ambulatory referral to Allergy    Referral Priority:   Routine    Referral Type:   Allergy Testing    Referral Reason:   Specialty Services Required    Requested Specialty:   Allergy    Number of Visits Requested:   1   POC COVID-19    Order Specific Question:   Previously tested for COVID-19    Answer:   Unknown    Order Specific Question:   Resident in a congregate (group) care setting    Answer:   Unknown    Order Specific Question:   Employed in healthcare setting    Answer:   Unknown    Order Specific Question:   Pregnant    Answer:   No   POC Influenza A&B (Binax test)     Meds ordered this encounter  Medications   hydrOXYzine (ATARAX) 50 MG tablet    Sig: Take 1 tablet (50 mg total) by mouth 3 (three) times daily as needed.    Dispense:  90 tablet  Refill:  3   fluticasone (FLONASE) 50  MCG/ACT nasal spray    Sig: Place 2 sprays into both nostrils daily. Use for 4-6 weeks then stop and use seasonally or as needed.    Dispense:  16 g    Refill:  3      Follow up plan: Return if symptoms worsen or fail to improve.    Nobie Putnam, [facility] 12/31/2022, 3:52 PM

## 2023-02-06 ENCOUNTER — Encounter: Payer: Self-pay | Admitting: Allergy

## 2023-02-06 ENCOUNTER — Ambulatory Visit: Payer: BC Managed Care – PPO | Admitting: Allergy

## 2023-02-06 ENCOUNTER — Other Ambulatory Visit: Payer: Self-pay

## 2023-02-06 VITALS — BP 118/80 | HR 67 | Temp 97.9°F | Resp 16 | Ht 64.0 in | Wt 169.2 lb

## 2023-02-06 DIAGNOSIS — H109 Unspecified conjunctivitis: Secondary | ICD-10-CM | POA: Diagnosis not present

## 2023-02-06 DIAGNOSIS — L299 Pruritus, unspecified: Secondary | ICD-10-CM | POA: Diagnosis not present

## 2023-02-06 DIAGNOSIS — Z872 Personal history of diseases of the skin and subcutaneous tissue: Secondary | ICD-10-CM

## 2023-02-06 DIAGNOSIS — H1013 Acute atopic conjunctivitis, bilateral: Secondary | ICD-10-CM | POA: Diagnosis not present

## 2023-02-06 DIAGNOSIS — J310 Chronic rhinitis: Secondary | ICD-10-CM

## 2023-02-06 MED ORDER — MONTELUKAST SODIUM 10 MG PO TABS: 10.0000 mg | ORAL_TABLET | Freq: Every day | ORAL | 3 refills | Status: DC

## 2023-02-06 MED ORDER — FAMOTIDINE 20 MG PO TABS: 20.0000 mg | ORAL_TABLET | Freq: Two times a day (BID) | ORAL | 3 refills | Status: DC

## 2023-02-06 NOTE — Progress Notes (Signed)
New Patient Note  RE: Amy Hutchinson MRN: [id number] DOB: 09-27-1961 Date of Office Visit: 02/06/2023   Primary care provider: Olin Hauser, DO  Chief Complaint: itching  History of present illness: Amy Hutchinson is a 62 y.o. female presenting today for evaluation of pruritus.   She presents with her husband.  She states she is having itching attacks.  She states it can develop anytime of day.  It comes out of the blue.  It happens in different environments.  This is something she has been dealing with for past 3-4 years.  She doe take hydroxyzine 50mg  daily.  She states if she is itching however she will then take benadryl.  The benadryl usually takes about 20 minutes to kick in and help.  She states multiple times a week she can have an itching attack.  She has tried benadryl spray that does not help. She states he has tried every lotion out there.  She is not sure what is driving the itch.   She states she did try one food at a time to see if it was causing itching; she is could not identify any food triggers.  She changed her laundry detergent and uses All & Free detergent.  Denies any fevers, joint aches/pains.  She states she has had large welts before and recalls having welts as a child.   She does take xyzal at night for irregular seasonal allergy symptoms.  She reports symptoms of itchy eyes, itchy nose primarily.  She is not sure if the xyzal is helping.  She has been on it for less than a year.  She states claritin and zyrtec did not do anything.   She reports being allergic to pet hair.  She will ports having a lot of infections.  She states she has had recurrent ear infections as well as sinus infections she states at least about once a month or so she is having a sinus or ear infection.  Denies having pneumonia.  She has not IV antibiotics.  She states she is up-to-date with her vaccines except she has not received her pneumonia vaccine for her age.   Review of  systems: Review of Systems  Constitutional: Negative.   HENT: Negative.    Eyes: Negative.   Respiratory: Negative.    Cardiovascular: Negative.   Gastrointestinal: Negative.   Musculoskeletal: Negative.   Skin:        See HPI  Allergic/Immunologic: Negative.   Neurological: Negative.     All other systems negative unless noted above in HPI  Past medical history: Past Medical History:  Diagnosis Date   Anxiety    Back pain    Depression    Hyperlipidemia    Recurrent upper respiratory infection (URI)     Past surgical history: Past Surgical History:  Procedure Laterality Date   ABDOMINAL HYSTERECTOMY     BIOPSY BREAST Left 2021   bx coil clip   BRAIN SURGERY     NECK SURGERY     TONSILLECTOMY AND ADENOIDECTOMY      Family history:  Family History  Problem Relation Age of Onset   Diabetes Mother    Bipolar disorder Mother    Anxiety disorder Mother    Depression Mother    Lung cancer Mother        lung   Anxiety disorder Sister    Depression Sister    Anxiety disorder Sister    Colon cancer Neg Hx    Breast cancer  Neg Hx     Social history: Lives in an apartment with carpeting in bedroom with electric heating and central cooling.  No pets in the home.  No concern for water damage, mildew or roaches in the home.  She does not work.  She reports smoking history.   Tobacco Use   Smoking status: Every Day    Packs/day: 0.40    Years: 32.00    Total pack years: 12.80    Types: Cigarettes   Smokeless tobacco: Never  Vaping Use   Vaping Use: Never used    Medication List: Current Outpatient Medications  Medication Sig Dispense Refill   alprazolam (XANAX) 2 MG tablet Take 0.5 tablets (1 mg total) by mouth 4 (four) times daily as needed for sleep or anxiety. 60 tablet 0   citalopram (CELEXA) 40 MG tablet Take 1 tablet (40 mg total) by mouth daily. 30 tablet    famotidine (PEPCID) 20 MG tablet Take 1 tablet (20 mg total) by mouth 2 (two) times daily. 60  tablet 3   fluticasone (FLONASE) 50 MCG/ACT nasal spray Place 2 sprays into both nostrils daily. Use for 4-6 weeks then stop and use seasonally or as needed. 16 g 3   hydrOXYzine (ATARAX) 50 MG tablet Take 1 tablet (50 mg total) by mouth 3 (three) times daily as needed. 90 tablet 3   levocetirizine (XYZAL) 5 MG tablet Take 5 mg by mouth every evening.     meclizine (ANTIVERT) 25 MG tablet Take 1 tablet (25 mg total) by mouth 3 (three) times daily as needed for dizziness. 90 tablet 2   montelukast (SINGULAIR) 10 MG tablet Take 1 tablet (10 mg total) by mouth at bedtime. 30 tablet 3   QUEtiapine (SEROQUEL) 100 MG tablet Take 1 tablet (100 mg total) by mouth at bedtime. (Patient taking differently: Take 100 mg by mouth 2 (two) times daily.) 30 tablet 2   rosuvastatin (CRESTOR) 10 MG tablet TAKE 1 TABLET BY MOUTH AT BEDTIME -  STOP  SIMVASTATIN 90 tablet 2   EPITOL 200 MG tablet Take by mouth. (Patient not taking: Reported on 02/06/2023)     No current facility-administered medications for this visit.    Known medication allergies: Allergies  Allergen Reactions   Codeine Nausea And Vomiting   Darvon [Propoxyphene] Nausea And Vomiting   Dilaudid [Hydromorphone] Nausea And Vomiting   Hydrocodone Nausea And Vomiting   Percocet [Oxycodone-Acetaminophen] Nausea And Vomiting     Physical examination: Blood pressure 118/80, pulse 67, temperature 97.9 F (36.6 C), temperature source Temporal, resp. rate 16, height 5\' 4"  (1.626 m), weight 169 lb 3.2 oz (76.7 kg), SpO2 97 %.  General: Alert, interactive, in no acute distress. HEENT: PERRLA, TMs pearly gray, turbinates non-edematous without discharge, post-pharynx non erythematous. Neck: Supple without lymphadenopathy. Lungs: Clear to auscultation without wheezing, rhonchi or rales. {no increased work of breathing. CV: Normal S1, S2 without murmurs. Abdomen: Nondistended, nontender. Skin: Warm and dry, without lesions or rashes. Extremities:  No  clubbing, cyanosis or edema. Neuro:   Grossly intact.  Diagnositics/Labs: None today  Assessment and plan: Pruritus History of urticaria Recurrent upper respiratory infections   - start Allegra 180mg  1 tab twice a day with Pepcid 20mg  1 tab twice a day - if above is not effective enough then would add in Singulair to this regimen.   - if triple therapy is not effective enough then would start Xolair monthly injections for control of chronic symptoms - ointments/creams/sprays are usually not helpful -  your symptoms have been ongoing for >6 weeks making this chronic thus will obtain labwork to evaluate: CBC w diff, CMP, tryptase, hive panel, environmental panel, alpha-gal panel, inflammatory markers - you can still use benadryl as needed for breaththrough symptoms  - stop Xyzal while on Allegra  - recommend getting your pneumonia vaccine (pneumovax preferred).   After that will monitor your frequency of infections.  If still having infections despite vaccination then will recommend the immunocompetence screening  Follow-up in about 2-3 months or sooner if needed  I appreciate the opportunity to take part in Delonna's care. Please do not hesitate to contact me with questions.  Sincerely,   Prudy Feeler, MD Allergy/Immunology [facility]

## 2023-02-06 NOTE — Patient Instructions (Addendum)
-   start Allegra '180mg'$  1 tab twice a day with Pepcid '20mg'$  1 tab twice a day - if above is not effective enough then would add in Singulair to this regimen.   - if triple therapy is not effective enough then would start Xolair monthly injections for control of chronic symptoms - ointments/creams/sprays are usually not helpful - your symptoms have been ongoing for >6 weeks making this chronic thus will obtain labwork to evaluate: CBC w diff, CMP, tryptase, hive panel, environmental panel, alpha-gal panel, inflammatory markers - you can still use benadryl as needed for breaththrough symptoms  - stop Xyzal while on Allegra  - recommend getting your pneumonia vaccine (pneumovax preferred).   After that will monitor your frequency of infections  Follow-up in about 2-3 months or sooner if needed

## 2023-02-14 LAB — COMPREHENSIVE METABOLIC PANEL
ALT: 16 IU/L (ref 0–32)
AST: 14 IU/L (ref 0–40)
Albumin/Globulin Ratio: 2.3 — ABNORMAL HIGH (ref 1.2–2.2)
Albumin: 4.8 g/dL (ref 3.8–4.9)
Alkaline Phosphatase: 90 IU/L (ref 44–121)
BUN/Creatinine Ratio: 16 (ref 12–28)
BUN: 13 mg/dL (ref 8–27)
Bilirubin Total: 0.4 mg/dL (ref 0.0–1.2)
CO2: 20 mmol/L (ref 20–29)
Calcium: 10 mg/dL (ref 8.7–10.3)
Chloride: 104 mmol/L (ref 96–106)
Creatinine, Ser: 0.81 mg/dL (ref 0.57–1.00)
Globulin, Total: 2.1 g/dL (ref 1.5–4.5)
Glucose: 85 mg/dL (ref 70–99)
Potassium: 4.4 mmol/L (ref 3.5–5.2)
Sodium: 141 mmol/L (ref 134–144)
Total Protein: 6.9 g/dL (ref 6.0–8.5)
eGFR: 83 mL/min/{1.73_m2} (ref >59)

## 2023-02-14 LAB — ALLERGENS W/TOTAL IGE AREA 2

## 2023-02-14 LAB — TSH: TSH: 0.925 u[IU]/mL (ref 0.450–4.500)

## 2023-02-14 LAB — CHRONIC URTICARIA: cu index: 21.2 — ABNORMAL HIGH (ref <10)

## 2023-02-14 LAB — ALPHA-GAL PANEL
Allergen Lamb IgE: <0.1 kU/L
Beef IgE: <0.1 kU/L
IgE (Immunoglobulin E), Serum: 156 IU/mL (ref 6–495)
O215-IgE Alpha-Gal: <0.1 kU/L
Pork IgE: <0.1 kU/L

## 2023-02-14 LAB — CBC WITH DIFFERENTIAL
Basophils Absolute: 0.1 10*3/uL (ref 0.0–0.2)
Basos: 1 %
EOS (ABSOLUTE): 0.1 10*3/uL (ref 0.0–0.4)
Eos: 1 %
Hematocrit: 43 % (ref 34.0–46.6)
Hemoglobin: 14.7 g/dL (ref 11.1–15.9)
Immature Grans (Abs): 0 10*3/uL (ref 0.0–0.1)
Immature Granulocytes: 0 %
Lymphocytes Absolute: 4.8 10*3/uL — ABNORMAL HIGH (ref 0.7–3.1)
Lymphs: 38 %
MCH: 30.5 pg (ref 26.6–33.0)
MCHC: 34.2 g/dL (ref 31.5–35.7)
MCV: 89 fL (ref 79–97)
Monocytes Absolute: 0.5 10*3/uL (ref 0.1–0.9)
Monocytes: 4 %
Neutrophils Absolute: 6.9 10*3/uL (ref 1.4–7.0)
Neutrophils: 56 %
RBC: 4.82 x10E6/uL (ref 3.77–5.28)
RDW: 13.3 % (ref 11.7–15.4)
WBC: 12.4 10*3/uL — ABNORMAL HIGH (ref 3.4–10.8)

## 2023-02-14 LAB — SEDIMENTATION RATE: Sed Rate: 28 mm/hr (ref 0–40)

## 2023-02-14 LAB — THYROID ANTIBODIES
Thyroglobulin Antibody: <1 IU/mL (ref 0.0–0.9)
Thyroperoxidase Ab SerPl-aCnc: 30 IU/mL (ref 0–34)

## 2023-02-14 LAB — TRYPTASE: Tryptase: 5.7 ug/L (ref 2.2–13.2)

## 2023-02-14 LAB — ANA W/REFLEX: Anti Nuclear Antibody (ANA): NEGATIVE

## 2023-02-21 ENCOUNTER — Telehealth: Payer: Self-pay | Admitting: Allergy & Immunology

## 2023-02-21 MED ORDER — MONTELUKAST SODIUM 10 MG PO TABS: 10.0000 mg | ORAL_TABLET | Freq: Every day | ORAL | 5 refills | Status: DC

## 2023-02-21 NOTE — Telephone Encounter (Signed)
Patient called and reported that she is still having a lot of itching. She remains on the Allegra and Pepcid BID. She has montelukast in her chart but she does not have this medication at all. I went ahead and sent this in again.  I did explain her lab results to her.  She has a positive chronic urticaria assay.  I discussed implications of this and told her that this to be more difficult to treat.  The remainder of her labs were completely negative.  I did offer her a course of prednisone to get things under control, but she preferred to hold off.  I did make a follow-up appointment with her.  Afternoons worked best for her, but unfortunately Dr. Nelva Bush does not have any openings in the next few weeks.  I did find a 305 appointment on April 19 which the patient was amenable to.  Salvatore Marvel, MD [facility]

## 2023-02-27 ENCOUNTER — Ambulatory Visit: Payer: BC Managed Care – PPO | Admitting: Allergy

## 2023-02-27 ENCOUNTER — Encounter: Payer: Self-pay | Admitting: Allergy

## 2023-02-27 ENCOUNTER — Other Ambulatory Visit: Payer: Self-pay

## 2023-02-27 VITALS — BP 112/60 | HR 103 | Temp 98.1°F | Resp 12 | Wt 170.3 lb

## 2023-02-27 DIAGNOSIS — L299 Pruritus, unspecified: Secondary | ICD-10-CM | POA: Diagnosis not present

## 2023-02-27 DIAGNOSIS — L509 Urticaria, unspecified: Secondary | ICD-10-CM | POA: Diagnosis not present

## 2023-02-27 MED ORDER — EPINEPHRINE 0.3 MG/0.3ML IJ SOAJ: 0.3000 mg | INTRAMUSCULAR | 2 refills | Status: DC | PRN

## 2023-02-27 NOTE — Patient Instructions (Addendum)
-   continue Allegra 180mg  1 tab up to 4 tabs a day for additional antihistamine response - continue Pepcid 20mg  1 tab twice a day - continue Singulair 10mg  daily - your labwork indicated autoimmune hives/itching - ointments/creams/sprays are usually not helpful - Xolair monthly injections discussed today for control of chronic itching/hives.  Benefits, risk and protocol discussed in detail today.  Will send in epinephrine device to have with you on injection days.  Will notify Tammy, our nurse coordinator, to start approval process.  She will call you.   - you can still use benadryl as needed for breaththrough symptoms   - recommend getting your pneumonia vaccine (pneumovax preferred).   After that will monitor your frequency of infections  Follow-up in about 3-4 months or sooner if needed

## 2023-02-27 NOTE — Progress Notes (Signed)
Follow-up Note  RE: Amy Hutchinson MRN: [id number] DOB: 10-20-1961 Date of Office Visit: 02/27/2023   History of present illness: Amy Hutchinson is a 62 y.o. female presenting today for follow-up of itching with history of hives as well as recurrent respiratory illnesses.  She was last seen in the office on 02/06/23 by myself.  She presents with her husband.  She is still symptomatic.  She reports the most bothersome symptom is how itchy she is.  She does have history of hives with the itching.  She would like to be not itchy.  She is currently taking allegra twice a day, pepcid twice a day and when she was still symptomatic on this regimen I added in singulair which she has been on now for a week.  She is still having symptoms and would like to move forward with Xolair.  She states she did forget to ask her PCP about the pneumonia vaccine she is due for routine health maintenance.   Review of systems: Review of Systems  Constitutional: Negative.   HENT: Negative.    Eyes: Negative.   Respiratory: Negative.    Cardiovascular: Negative.   Gastrointestinal: Negative.   Musculoskeletal: Negative.   Skin:        See HPI  Allergic/Immunologic: Negative.   Neurological: Negative.      All other systems negative unless noted above in HPI  Past medical/social/surgical/family history have been reviewed and are unchanged unless specifically indicated below.  No changes  Medication List: Current Outpatient Medications  Medication Sig Dispense Refill   alprazolam (XANAX) 2 MG tablet Take 0.5 tablets (1 mg total) by mouth 4 (four) times daily as needed for sleep or anxiety. 60 tablet 0   citalopram (CELEXA) 40 MG tablet Take 1 tablet (40 mg total) by mouth daily. 30 tablet    famotidine (PEPCID) 20 MG tablet Take 1 tablet (20 mg total) by mouth 2 (two) times daily. 60 tablet 3   fluticasone (FLONASE) 50 MCG/ACT nasal spray Place 2 sprays into both nostrils daily. Use for 4-6 weeks then stop  and use seasonally or as needed. 16 g 3   hydrOXYzine (ATARAX) 50 MG tablet Take 1 tablet (50 mg total) by mouth 3 (three) times daily as needed. 90 tablet 3   levocetirizine (XYZAL) 5 MG tablet Take 5 mg by mouth every evening.     meclizine (ANTIVERT) 25 MG tablet Take 1 tablet (25 mg total) by mouth 3 (three) times daily as needed for dizziness. 90 tablet 2   montelukast (SINGULAIR) 10 MG tablet Take 1 tablet (10 mg total) by mouth at bedtime. 30 tablet 5   QUEtiapine (SEROQUEL) 100 MG tablet Take 1 tablet (100 mg total) by mouth at bedtime. (Patient taking differently: Take 100 mg by mouth 2 (two) times daily.) 30 tablet 2   rosuvastatin (CRESTOR) 10 MG tablet TAKE 1 TABLET BY MOUTH AT BEDTIME -  STOP  SIMVASTATIN 90 tablet 2   EPITOL 200 MG tablet Take by mouth. (Patient not taking: Reported on 02/06/2023)     No current facility-administered medications for this visit.     Known medication allergies: Allergies  Allergen Reactions   Codeine Nausea And Vomiting   Darvon [Propoxyphene] Nausea And Vomiting   Dilaudid [Hydromorphone] Nausea And Vomiting   Hydrocodone Nausea And Vomiting   Percocet [Oxycodone-Acetaminophen] Nausea And Vomiting     Physical examination: Blood pressure 112/60, pulse (!) 103, temperature 98.1 F (36.7 C), temperature source Temporal, resp. rate 12,  weight 170 lb 4.8 oz (77.2 kg), SpO2 94 %.  General: Alert, interactive, in no acute distress. HEENT: PERRLA, TMs pearly gray, turbinates non-edematous without discharge, post-pharynx non erythematous. Neck: Supple without lymphadenopathy. Lungs: Clear to auscultation without wheezing, rhonchi or rales. {no increased work of breathing. CV: Normal S1, S2 without murmurs. Abdomen: Nondistended, nontender. Skin: Warm and dry, without lesions or rashes. Extremities:  No clubbing, cyanosis or edema. Neuro:   Grossly intact.  Diagnositics/Labs: Labs:  Component     Latest Ref Rng 02/06/2023  D Pteronyssinus  IgE     Class 0 kU/L <0.10   D Farinae IgE     Class 0 kU/L <0.10   Cat Dander IgE     Class 0 kU/L <0.10   Dog Dander IgE     Class 0 kU/L <0.10   [place] Grass IgE     Class 0 kU/L <0.10   Timothy Grass IgE     Class 0 kU/L <0.10   Johnson Grass IgE     Class 0 kU/L <0.10   Cockroach, German IgE     Class 0 kU/L <0.10   Penicillium Chrysogen IgE     Class 0 kU/L <0.10   Cladosporium Herbarum IgE     Class 0 kU/L <0.10   Aspergillus Fumigatus IgE     Class 0 kU/L <0.10   Alternaria Alternata IgE     Class 0 kU/L <0.10   Maple/Box Elder IgE     Class 0 kU/L <0.10   Common Silver Wendee Copp IgE     Class 0 kU/L <0.10   Cedar, [place] IgE     Class 0 kU/L <0.10   Oak, White IgE     Class 0 kU/L <0.10   Elm, American IgE     Class 0 kU/L <0.10   Cottonwood IgE     Class 0 kU/L <0.10   Pecan, Hickory IgE     Class 0 kU/L <0.10   White Mulberry IgE     Class 0 kU/L <0.10   Ragweed, Short IgE     Class 0 kU/L <0.10   Pigweed, Rough IgE     Class 0 kU/L <0.10   Sheep Sorrel IgE Qn     Class 0 kU/L <0.10   Mouse Urine IgE     Class 0 kU/L <0.10   WBC     3.4 - 10.8 x10E3/uL 12.4 (H)   RBC     3.77 - 5.28 x10E6/uL 4.82   Hemoglobin     11.1 - 15.9 g/dL 14.7   HCT     34.0 - 46.6 % 43.0   MCV     79 - 97 fL 89   MCH     26.6 - 33.0 pg 30.5   MCHC     31.5 - 35.7 g/dL 34.2   RDW     11.7 - 15.4 % 13.3   Neutrophils     Not Estab. % 56   Lymphs     Not Estab. % 38   Monocytes     Not Estab. % 4   Eos     Not Estab. % 1   Basos     Not Estab. % 1   NEUT#     1.4 - 7.0 x10E3/uL 6.9   Lymphocyte #     0.7 - 3.1 x10E3/uL 4.8 (H)   Monocytes Absolute     0.1 - 0.9 x10E3/uL 0.5   EOS (ABSOLUTE)  0.0 - 0.4 x10E3/uL 0.1   Basophils Absolute     0.0 - 0.2 x10E3/uL 0.1   Immature Granulocytes     Not Estab. % 0   Immature Grans (Abs)     0.0 - 0.1 x10E3/uL 0.0   Glucose     70 - 99 mg/dL 85   BUN     8 - 27 mg/dL 13   Creatinine     0.57 - 1.00  mg/dL 0.81   eGFR     >59 mL/min/1.73 83   BUN/Creatinine Ratio     12 - 28  16   Sodium     134 - 144 mmol/L 141   Potassium     3.5 - 5.2 mmol/L 4.4   Chloride     96 - 106 mmol/L 104   CO2     20 - 29 mmol/L 20   Calcium     8.7 - 10.3 mg/dL 10.0   Total Protein     6.0 - 8.5 g/dL 6.9   Albumin     3.8 - 4.9 g/dL 4.8   Globulin, Total     1.5 - 4.5 g/dL 2.1   Albumin/Globulin Ratio     1.2 - 2.2  2.3 (H)   Total Bilirubin     0.0 - 1.2 mg/dL 0.4   Alkaline Phosphatase     44 - 121 IU/L 90   AST     0 - 40 IU/L 14   ALT     0 - 32 IU/L 16   Class Description Allergens Comment   IgE (Immunoglobulin E), Serum     6 - 495 IU/mL 156   [id number] Alpha-Gal     Class 0 kU/L <0.10   Beef IgE     Class 0 kU/L <0.10   Pork IgE     Class 0 kU/L <0.10   Allergen Lamb IgE     Class 0 kU/L <0.10   Thyroperoxidase Ab SerPl-aCnc     0 - 34 IU/mL 30   Thyroglobulin Antibody     0.0 - 0.9 IU/mL <1.0   Anti Nuclear Antibody (ANA)     Negative  Negative   Sed Rate     0 - 40 mm/hr 28   Tryptase     2.2 - 13.2 ug/L 5.7   cu index     <10  21.2 (H)   TSH     0.450 - 4.500 uIU/mL 0.925      Assessment and plan:  - continue Allegra 180mg  1 tab up to 4 tabs a day for additional antihistamine response - continue Pepcid 20mg  1 tab twice a day - continue Singulair 10mg  daily - your labwork indicated autoimmune hives/itching - ointments/creams/sprays are usually not helpful - Xolair monthly injections discussed today for control of chronic itching/hives.  Benefits, risk and protocol discussed in detail today.  Will send in epinephrine device to have with you on injection days.  Will notify Tammy, our nurse coordinator, to start approval process.  She will call you.   - you can still use benadryl as needed for breaththrough symptoms   - recommend getting your pneumonia vaccine (pneumovax preferred).   After that will monitor your frequency of infections  Follow-up in about  3-4 months or sooner if needed  I appreciate the opportunity to take part in Corda's care. Please do not hesitate to contact me with questions.  Sincerely,   Prudy Feeler, MD Allergy/Immunology [facility]

## 2023-03-03 DIAGNOSIS — F411 Generalized anxiety disorder: Secondary | ICD-10-CM | POA: Diagnosis not present

## 2023-03-03 DIAGNOSIS — F410 Panic disorder [episodic paroxysmal anxiety] without agoraphobia: Secondary | ICD-10-CM | POA: Diagnosis not present

## 2023-03-03 DIAGNOSIS — F3340 Major depressive disorder, recurrent, in remission, unspecified: Secondary | ICD-10-CM | POA: Diagnosis not present

## 2023-03-04 ENCOUNTER — Telehealth: Payer: Self-pay | Admitting: *Deleted

## 2023-03-04 ENCOUNTER — Other Ambulatory Visit: Payer: Self-pay | Admitting: Family Medicine

## 2023-03-04 DIAGNOSIS — E782 Mixed hyperlipidemia: Secondary | ICD-10-CM

## 2023-03-04 NOTE — Telephone Encounter (Signed)
-----   Message from Davis, MD sent at 02/27/2023  1:20 PM EDT ----- She is on now H1, H2 and singulair still with symptoms.  Can we proceed with Xolair.

## 2023-03-04 NOTE — Telephone Encounter (Signed)
Requested Prescriptions  Pending Prescriptions Disp Refills   rosuvastatin (CRESTOR) 10 MG tablet [Pharmacy Med Name: Rosuvastatin Calcium 10 MG Oral Tablet] 90 tablet 0    Sig: TAKE 1 TABLET BY MOUTH AT BEDTIME (STOP  SIMVASTATIN)     Cardiovascular:  Antilipid - Statins 2 Failed - 03/04/2023  6:45 AM      Failed - Lipid Panel in normal range within the last 12 months    Cholesterol  Date Value Ref Range Status  03/18/2022 174 <200 mg/dL Final   LDL Cholesterol (Calc)  Date Value Ref Range Status  03/18/2022 104 (H) mg/dL (calc) Final    Comment:    Reference range: <100 . Desirable range <100 mg/dL for primary prevention;   <70 mg/dL for patients with CHD or diabetic patients  with > or = 2 CHD risk factors. Marland Kitchen LDL-C is now calculated using the Martin-Hopkins  calculation, which is a validated novel method providing  better accuracy than the Friedewald equation in the  estimation of LDL-C.  Cresenciano Genre et al. Annamaria Helling. WG:[id number]): 2061-2068  (http://education.[facility].com/faq/FAQ164)    HDL  Date Value Ref Range Status  03/18/2022 40 (L) > OR = 50 mg/dL Final   Triglycerides  Date Value Ref Range Status  03/18/2022 181 (H) <150 mg/dL Final         Passed - Cr in normal range and within 360 days    Creat  Date Value Ref Range Status  03/18/2022 0.83 0.50 - 1.03 mg/dL Final   Creatinine, Ser  Date Value Ref Range Status  02/06/2023 0.81 0.57 - 1.00 mg/dL Final         Passed - Patient is not pregnant      Passed - Valid encounter within last 12 months    Recent Outpatient Visits           2 months ago [facility]   [place], DO   4 months ago [facility]   [facility] [place], [place] W, NP   5 months ago Acute non-recurrent frontal sinusitis   [facility] Olin Hauser, DO   11 months ago Annual physical exam   [facility] Olin Hauser, DO   1 year ago [facility] [place], Devonne Dought[place], [place]

## 2023-03-04 NOTE — Telephone Encounter (Signed)
L/M for patient to contact me to advised approval, copay card and submit to [facility]

## 2023-03-06 ENCOUNTER — Other Ambulatory Visit (HOSPITAL_COMMUNITY): Payer: Self-pay

## 2023-03-06 MED ORDER — OMALIZUMAB 150 MG/ML ~~LOC~~ SOSY
300.0000 mg | PREFILLED_SYRINGE | SUBCUTANEOUS | 11 refills | Status: DC
  Filled 2023-03-06 – 2023-03-12 (×2): qty 2, 28d supply, fill #0

## 2023-03-06 NOTE — Telephone Encounter (Signed)
Spoke to patient and advised submit to [facility] and will reach out once delivery set to clinic to make appt to start therapy

## 2023-03-07 ENCOUNTER — Other Ambulatory Visit: Payer: Self-pay

## 2023-03-12 ENCOUNTER — Other Ambulatory Visit (HOSPITAL_COMMUNITY): Payer: Self-pay

## 2023-03-12 ENCOUNTER — Other Ambulatory Visit: Payer: Self-pay

## 2023-03-27 ENCOUNTER — Telehealth: Payer: Self-pay | Admitting: *Deleted

## 2023-03-27 ENCOUNTER — Ambulatory Visit: Payer: Self-pay

## 2023-03-27 NOTE — Telephone Encounter (Signed)
  Chief Complaint: URI Symptoms: bad cough with congestion, HA, body aches, earache, SOB at times  Frequency: since last week when went to [place] Pertinent Negatives: Patient denies fever Disposition: ED /[] Urgent Care (no appt availability in office) / Appointment(In office/virtual)/  [facility]/ Home Care/ Refused Recommended Disposition /[] [facility]/  Follow-up with PCP Additional Notes: pt went to [place] last week and started getting sick, came home Saturday and not feeling any better. Hasn't taken a home COVID test. Has been taking Sudafed and Tylenol for sx but not getting a lot of relief. Scheduled OV tomorrow at 1100 with Rene Kocher, NP.   Reason for Disposition  Earache  Answer Assessment - Initial Assessment Questions 2. ONSET: "When did the sinus pain start?"  (e.g., hours, days)      Last week 3. SEVERITY: "How bad is the pain?"   (Scale 1-10; mild, moderate or severe)   - MILD (1-3): doesn't interfere with normal activities    - MODERATE (4-7): interferes with normal activities (e.g., work or school) or awakens from sleep   - SEVERE (8-10): excruciating pain and patient unable to do any normal activities        Mild to moderate 5. NASAL CONGESTION: "Is the nose blocked?" If Yes, ask: "Can you open it or must you breathe through your mouth?"     yes 7. FEVER: "Do you have a fever?" If Yes, ask: "What is it, how was it measured, and when did it start?"      no 8. OTHER SYMPTOMS: "Do you have any other symptoms?" (e.g., sore throat, cough, earache, difficulty breathing)     HA, body aches, cough with congestion, earaches, SOB at times  Protocols used: Sinus Pain or Congestion-A-AH

## 2023-03-27 NOTE — Telephone Encounter (Signed)
Called and made appt for her to start xolair

## 2023-03-28 ENCOUNTER — Ambulatory Visit: Payer: BC Managed Care – PPO | Admitting: Internal Medicine

## 2023-03-28 ENCOUNTER — Encounter: Payer: Self-pay | Admitting: Internal Medicine

## 2023-03-28 VITALS — BP 124/84 | HR 92 | Temp 96.4°F | Wt 166.0 lb

## 2023-03-28 DIAGNOSIS — B9789 Other viral agents as the cause of diseases classified elsewhere: Secondary | ICD-10-CM

## 2023-03-28 DIAGNOSIS — J329 Chronic sinusitis, unspecified: Secondary | ICD-10-CM | POA: Diagnosis not present

## 2023-03-28 MED ORDER — AMOXICILLIN-POT CLAVULANATE 400-57 MG/5ML PO SUSR: 875.0000 mg | Freq: Two times a day (BID) | ORAL | 0 refills | Status: DC

## 2023-03-28 MED ORDER — PREDNISOLONE SODIUM PHOSPHATE 15 MG/5ML PO SOLN: 20.0000 mg | Freq: Every day | ORAL | 0 refills | Status: AC

## 2023-03-28 MED ORDER — AMOXICILLIN-POT CLAVULANATE 400-57 MG/5ML PO SUSR: 875.0000 mg | Freq: Two times a day (BID) | ORAL | 0 refills | Status: AC

## 2023-03-28 NOTE — Progress Notes (Signed)
HPI  Pt presents to the clinic today with c/o headache, sinus pressure, runny nose, nasal congestion, ear pain, sore throat and cough. This started 5 days ago.  The headache is located in her forehead.  She is not blowing any mucus out of her nose.  She is having some difficulty swallowing.  The cough is productive of colored mucus at times.  She denies nausea, vomiting or diarrhea.  She denies fever or chills but has had body aches.  She has tried Tylenol and Robituissin with minimal relief of symptoms. She has had sick contacts. She has not taken a covid test. She does have bad allergist, starts allergy shots on May 5th.   Review of Systems      Past Medical History:  Diagnosis Date   Anxiety    Back pain    Depression    Hyperlipidemia    Recurrent upper respiratory infection (URI)     Family History  Problem Relation Age of Onset   Diabetes Mother    Bipolar disorder Mother    Anxiety disorder Mother    Depression Mother    Lung cancer Mother        lung   Anxiety disorder Sister    Depression Sister    Anxiety disorder Sister    Colon cancer Neg Hx    Breast cancer Neg Hx     Social History   Socioeconomic History   Marital status: Married    Spouse name: Not on file   Number of children: Not on file   Years of education: High School   Highest education level: High school graduate  Occupational History   Not on file  Tobacco Use   Smoking status: Every Day    Packs/day: 0.40    Years: 32.00    Additional pack years: 0.00    Total pack years: 12.80    Types: Cigarettes   Smokeless tobacco: Never  Vaping Use   Vaping Use: Never used  Substance and Sexual Activity   Alcohol use: Not Currently   Drug use: Never   Sexual activity: Not on file  Other Topics Concern   Not on file  Social History Narrative   Not on file   Social Determinants of Health   Financial Resource Strain: Not on file  Food Insecurity: Not on file  Transportation Needs: Not on file   Physical Activity: Not on file  Stress: Not on file  Social Connections: Not on file  Intimate Partner Violence: Not on file    Allergies  Allergen Reactions   Codeine Nausea And Vomiting   Darvon [Propoxyphene] Nausea And Vomiting   Dilaudid [Hydromorphone] Nausea And Vomiting   Hydrocodone Nausea And Vomiting   Percocet [Oxycodone-Acetaminophen] Nausea And Vomiting     Constitutional: Positive headache, fatigue. Denies fever or abrupt weight changes.  HEENT:  Positive sinus pressure, runny nose, nasal congestion, ear pain and sore throat. Denies eye redness, eye pain, pressure behind the eyes, ringing in the ears, wax buildup, or bloody nose. Respiratory: Positive cough. Denies difficulty breathing or shortness of breath.  Cardiovascular: Denies chest pain, chest tightness, palpitations or swelling in the hands or feet.   No other specific complaints in a complete review of systems (except as listed in HPI above).  Objective:   BP 124/84 (BP Location: Left Arm, Patient Position: Sitting, Cuff Size: Normal)   Pulse 92   Temp (!) 96.4 F (35.8 C) (Temporal)   Wt 166 lb (75.3 kg)  SpO2 99%   BMI 28.49 kg/m   Wt Readings from Last 3 Encounters:  02/27/23 170 lb 4.8 oz (77.2 kg)  02/06/23 169 lb 3.2 oz (76.7 kg)  12/31/22 168 lb (76.2 kg)     General: Appears her stated age, appears in well but in NAD. HEENT: Head: normal shape and size, maxillary sinus tenderness noted; Eyes: sclera white, no icterus, conjunctiva pink; Ears: Tm's gray and intact, normal light reflex, + serous effusion noted on the right; Nose: mucosa pink and moist, septum midline; Throat/Mouth: + PND. Teeth present, mucosa erythematous and moist, no exudate noted, no lesions or ulcerations noted.  Neck: No cervical lymphadenopathy.  Cardiovascular: Normal rate and rhythm. S1,S2 noted.  No murmur, rubs or gallops noted.  Pulmonary/Chest: Normal effort and positive vesicular breath sounds. No respiratory  distress. No wheezes, rales or ronchi noted.       Assessment & Plan:   Viral Sinusitis  Get some rest and drink plenty of water Do salt water gargles for the sore throat Rx for Prednisolone 20 mg daily x 5 days Rx for Augmentin 875-125 mg twice daily x 10 days to start on Sunday if symptoms worsen  Follow up with your PCP as previously scheduled   Nicki Reaper, NP

## 2023-03-28 NOTE — Patient Instructions (Signed)
Sinus Pain  Sinus pain may occur when your sinuses become clogged or swollen. Sinuses are air-filled spaces in your skull that are behind the bones of your face and forehead. Sinus pain can range from mild to severe. What are the causes? Sinus pain can result from various conditions that affect the sinuses. Common causes include: Colds. Sinus infections. Allergies. What are the signs or symptoms? The main symptom of this condition is pain or pressure in your face, forehead, ears, or upper teeth. People who have sinus pain often have other symptoms, such as: Congested or runny nose. Fever. Inability to smell. Headache. Weather changes can make symptoms worse. How is this diagnosed? Your health care provider will diagnose this condition based on your symptoms and a physical exam. If you have pain that keeps coming back or does not go away, your health care provider may recommend more testing. This may include: Imaging tests, such as a CT scan or MRI, to check for problems with your sinuses. Examination of your sinuses using a thin tool with a camera that is inserted through your nose (endoscopy). How is this treated? Treatment for this condition depends on the cause. Sinus pain that is caused by a sinus infection may be treated with antibiotic medicine. Sinus pain that is caused by congestion may be helped by rinsing out (flushing) the nose and sinuses with saline solution. Sinus pain that is caused by allergies may be helped by allergy medicines (antihistamines) and medicated nasal sprays. Sinus surgery may be needed in some cases if other treatments do not help. Follow these instructions at home: General instructions If directed: Apply a warm, moist washcloth to your face to help relieve pain. Use a nasal saline wash. Follow the directions on the bottle or box. Hydrate and humidify Drink enough water to keep your urine clear or pale yellow. Staying hydrated will help to thin your  mucus. Use a humidifier if your home is dry. Inhale steam for 10-15 minutes, 3-4 times a day or as told by your health care provider. You can do this in the bathroom while a hot shower is running. Limit your exposure to cool or dry air. Medicines  Take over-the-counter and prescription medicines only as told by your health care provider. If you were prescribed an antibiotic medicine, take it as told by your health care provider. Do not stop taking the antibiotic even if you start to feel better. If you have congestion, use a nasal spray to help lessen pressure. Contact a health care provider if: You have sinus pain more than one time a week. You have sensitivity to light or sound. You develop a fever. You feel nauseous or you vomit. Your sinus pain or headache does not get better with treatment. Get help right away if: You have vision problems. You have sudden, severe pain in your face or head. You have a seizure. You are confused. You have a stiff neck. Summary Sinus pain occurs when your sinuses become clogged or swollen. Sinus pain can result from various conditions that affect the sinuses, such as a cold, a sinus infection, or an allergy. Treatment for this condition depends on the cause. It may include medicine, such as antibiotics or antihistamines. This information is not intended to replace advice given to you by your health care provider. Make sure you discuss any questions you have with your health care provider. Document Revised: 10/21/2021 Document Reviewed: 10/21/2021 Elsevier Patient Education  2023 Elsevier Inc.  

## 2023-03-28 NOTE — Addendum Note (Signed)
Addended by: Lorre Munroe on: 03/28/2023 11:03 AM   Modules accepted: Orders

## 2023-04-01 ENCOUNTER — Ambulatory Visit (INDEPENDENT_AMBULATORY_CARE_PROVIDER_SITE_OTHER): Payer: BC Managed Care – PPO

## 2023-04-01 DIAGNOSIS — L501 Idiopathic urticaria: Secondary | ICD-10-CM

## 2023-04-01 MED ORDER — OMALIZUMAB 150 MG/ML ~~LOC~~ SOSY
300.0000 mg | PREFILLED_SYRINGE | SUBCUTANEOUS | Status: AC
  Administered 2023-04-01 – 2023-09-25 (×7): 300 mg via SUBCUTANEOUS

## 2023-04-01 NOTE — Progress Notes (Signed)
Immunotherapy   Patient Details  Name: Amy Hutchinson MRN: [id number] Date of Birth: 01/04/1961  04/01/2023  Amy Hutchinson started injections for Hives. Patient received 300 mg of Xolair. Patient waited 30 minutes with no problems.    Frequency: every 28 days Epi-Pen:Epi-Pen Available  Consent signed and patient instructions given.   Dub Mikes 04/01/2023, 4:13 PM

## 2023-04-11 ENCOUNTER — Ambulatory Visit: Payer: Self-pay | Admitting: *Deleted

## 2023-04-11 NOTE — Telephone Encounter (Signed)
  Chief Complaint: cough Symptoms: cough, ear pain,coughing spells Frequency: OV 4/26 Pertinent Negatives: Patient denies fever, SOB Disposition: [] ED /[] Urgent Care (no appt availability in office) / [x] Appointment(In office/virtual)/ []  [facility]/ [] Home Care/ [] Refused Recommended Disposition /[] [facility]/ []  Follow-up with PCP Additional Notes: Patient is calling- she continues to have cough and ear pain. Patient states she never improved with antibiotic and prednisone. Patient has been scheduled for appointment- advised to be seen if she gets worse over the week end.

## 2023-04-11 NOTE — Telephone Encounter (Signed)
Summary: sinus infection?   Pt came in the office on 03/28/23 and seen [facility] and was diagnosed with a viral sinus infection. Pt states that the mediation has not helped and she still has a bad cough, and having all the same symptoms. Pt states it is hard for her to swallow big pills even when she cuts them in half. Pt is wanting to see if she can have something else called in for her to take. Please advise.     Reason for Disposition  [1] Continuous (nonstop) coughing interferes with work or school AND [2] no improvement using cough treatment per Care Advice  Answer Assessment - Initial Assessment Questions 1. ONSET: "When did the cough begin?"      Patient has had cough 4/26- was given Amoxicillin-Pot Clavulanate 400-57 MG/5ML 875 mg Oral 2 times daily- never really improved 2. SEVERITY: "How bad is the cough today?"      Morning and evening is worse 3. SPUTUM: "Describe the color of your sputum" (none, dry cough; clear, white, yellow, green)     Yes- but not getting it up 4. HEMOPTYSIS: "Are you coughing up any blood?" If so ask: "How much?" (flecks, streaks, tablespoons, etc.)     no 5. DIFFICULTY BREATHING: "Are you having difficulty breathing?" If Yes, ask: "How bad is it?" (e.g., mild, moderate, severe)    - MILD: No SOB at rest, mild SOB with walking, speaks normally in sentences, can lie down, no retractions, pulse < 100.    - MODERATE: SOB at rest, SOB with minimal exertion and prefers to sit, cannot lie down flat, speaks in phrases, mild retractions, audible wheezing, pulse 100-120.    - SEVERE: Very SOB at rest, speaks in single words, struggling to breathe, sitting hunched forward, retractions, pulse > 120      normal 6. FEVER: "Do you have a fever?" If Yes, ask: "What is your temperature, how was it measured, and when did it start?"     no  10. OTHER SYMPTOMS: "Do you have any other symptoms?" (e.g., runny nose, wheezing, chest pain)       Ears hurt  Protocols used:  Cough - Acute Non-Productive-A-AH, Cough - Chronic-A-AH

## 2023-04-14 ENCOUNTER — Encounter: Payer: Self-pay | Admitting: Internal Medicine

## 2023-04-14 ENCOUNTER — Ambulatory Visit: Payer: BC Managed Care – PPO | Admitting: Internal Medicine

## 2023-04-14 VITALS — BP 112/64 | HR 85 | Temp 96.9°F | Wt 168.0 lb

## 2023-04-14 DIAGNOSIS — F17200 Nicotine dependence, unspecified, uncomplicated: Secondary | ICD-10-CM

## 2023-04-14 DIAGNOSIS — J329 Chronic sinusitis, unspecified: Secondary | ICD-10-CM | POA: Diagnosis not present

## 2023-04-14 DIAGNOSIS — R051 Acute cough: Secondary | ICD-10-CM | POA: Diagnosis not present

## 2023-04-14 MED ORDER — ALBUTEROL SULFATE HFA 108 (90 BASE) MCG/ACT IN AERS: 1.0000 | INHALATION_SPRAY | Freq: Four times a day (QID) | RESPIRATORY_TRACT | 0 refills | Status: DC | PRN

## 2023-04-14 NOTE — Patient Instructions (Signed)
Sinus Pain  Sinus pain may occur when your sinuses become clogged or swollen. Sinuses are air-filled spaces in your skull that are behind the bones of your face and forehead. Sinus pain can range from mild to severe. What are the causes? Sinus pain can result from various conditions that affect the sinuses. Common causes include: Colds. Sinus infections. Allergies. What are the signs or symptoms? The main symptom of this condition is pain or pressure in your face, forehead, ears, or upper teeth. People who have sinus pain often have other symptoms, such as: Congested or runny nose. Fever. Inability to smell. Headache. Weather changes can make symptoms worse. How is this diagnosed? Your health care provider will diagnose this condition based on your symptoms and a physical exam. If you have pain that keeps coming back or does not go away, your health care provider may recommend more testing. This may include: Imaging tests, such as a CT scan or MRI, to check for problems with your sinuses. Examination of your sinuses using a thin tool with a camera that is inserted through your nose (endoscopy). How is this treated? Treatment for this condition depends on the cause. Sinus pain that is caused by a sinus infection may be treated with antibiotic medicine. Sinus pain that is caused by congestion may be helped by rinsing out (flushing) the nose and sinuses with saline solution. Sinus pain that is caused by allergies may be helped by allergy medicines (antihistamines) and medicated nasal sprays. Sinus surgery may be needed in some cases if other treatments do not help. Follow these instructions at home: General instructions If directed: Apply a warm, moist washcloth to your face to help relieve pain. Use a nasal saline wash. Follow the directions on the bottle or box. Hydrate and humidify Drink enough water to keep your urine clear or pale yellow. Staying hydrated will help to thin your  mucus. Use a humidifier if your home is dry. Inhale steam for 10-15 minutes, 3-4 times a day or as told by your health care provider. You can do this in the bathroom while a hot shower is running. Limit your exposure to cool or dry air. Medicines  Take over-the-counter and prescription medicines only as told by your health care provider. If you were prescribed an antibiotic medicine, take it as told by your health care provider. Do not stop taking the antibiotic even if you start to feel better. If you have congestion, use a nasal spray to help lessen pressure. Contact a health care provider if: You have sinus pain more than one time a week. You have sensitivity to light or sound. You develop a fever. You feel nauseous or you vomit. Your sinus pain or headache does not get better with treatment. Get help right away if: You have vision problems. You have sudden, severe pain in your face or head. You have a seizure. You are confused. You have a stiff neck. Summary Sinus pain occurs when your sinuses become clogged or swollen. Sinus pain can result from various conditions that affect the sinuses, such as a cold, a sinus infection, or an allergy. Treatment for this condition depends on the cause. It may include medicine, such as antibiotics or antihistamines. This information is not intended to replace advice given to you by your health care provider. Make sure you discuss any questions you have with your health care provider. Document Revised: 10/21/2021 Document Reviewed: 10/21/2021 Elsevier Patient Education  2023 Elsevier Inc.  

## 2023-04-14 NOTE — Progress Notes (Signed)
Subjective:    Patient ID: Amy Hutchinson, female    DOB: 1961-07-19, 62 y.o.   MRN: [id number]  HPI  Patient presents to clinic today with complaint of headaches, sinus pressure, ear pain and cough. This has been an ongoing 3 weeks.  The headache is located in her forehead.  She denies ear drainage or loss of hearing.  The cough is nonproductive.  She denies runny nose, nasal congestion, sore throat, shortness of breath, chest pain, nausea, vomiting or diarrhea.  She denies fever, chills or bodyaches.  She was seen 4/26 for sinus symptoms.  She was diagnosed with viral sinusitis.  She was given Prednisone to take for symptom manage and advised if not better in a few days to start antibiotic that was provided.  She reports she did not get any improvement with antibiotics or steroids.  She reports recurrent sinus infections multiple times per year.  She does have allergies treated with Fexofenadine, Flonase and Pepcid.  She reports she started allergy shots 2 weeks ago.  She has no history of asthma.  She does smoke.  Review of Systems     Past Medical History:  Diagnosis Date   Anxiety    Back pain    Depression    Hyperlipidemia    Recurrent upper respiratory infection (URI)     Current Outpatient Medications  Medication Sig Dispense Refill   alprazolam (XANAX) 2 MG tablet Take 0.5 tablets (1 mg total) by mouth 4 (four) times daily as needed for sleep or anxiety. 60 tablet 0   citalopram (CELEXA) 40 MG tablet Take 1 tablet (40 mg total) by mouth daily. 30 tablet    EPINEPHrine 0.3 mg/0.3 mL IJ SOAJ injection Inject 0.3 mg into the muscle as needed for anaphylaxis. 1 each 2   EPITOL 200 MG tablet Take by mouth. (Patient not taking: Reported on 02/06/2023)     famotidine (PEPCID) 20 MG tablet Take 1 tablet (20 mg total) by mouth 2 (two) times daily. 60 tablet 3   fexofenadine (ALLEGRA) 180 MG tablet Take 180 mg by mouth daily.     fluticasone (FLONASE) 50 MCG/ACT nasal spray Place 2 sprays  into both nostrils daily. Use for 4-6 weeks then stop and use seasonally or as needed. 16 g 3   hydrOXYzine (ATARAX) 50 MG tablet Take 1 tablet (50 mg total) by mouth 3 (three) times daily as needed. 90 tablet 3   levocetirizine (XYZAL) 5 MG tablet Take 5 mg by mouth every evening. (Patient not taking: Reported on 03/28/2023)     meclizine (ANTIVERT) 25 MG tablet Take 1 tablet (25 mg total) by mouth 3 (three) times daily as needed for dizziness. 90 tablet 2   montelukast (SINGULAIR) 10 MG tablet Take 1 tablet (10 mg total) by mouth at bedtime. 30 tablet 5   omalizumab (XOLAIR) 150 MG/ML prefilled syringe Inject 300 mg into the skin every 28 (twenty-eight) days. (Patient not taking: Reported on 03/28/2023) 2 mL 11   QUEtiapine (SEROQUEL) 100 MG tablet Take 1 tablet (100 mg total) by mouth at bedtime. (Patient taking differently: Take 100 mg by mouth 2 (two) times daily.) 30 tablet 2   rosuvastatin (CRESTOR) 10 MG tablet TAKE 1 TABLET BY MOUTH AT BEDTIME (STOP  SIMVASTATIN) 90 tablet 0   Current Facility-Administered Medications  Medication Dose Route Frequency Provider Last Rate Last Admin   omalizumab Geoffry Paradise) prefilled syringe 300 mg  300 mg Subcutaneous Q28 days Marcelyn Bruins, MD   300 mg  at 04/01/23 1551    Allergies  Allergen Reactions   Codeine Nausea And Vomiting   Darvon [Propoxyphene] Nausea And Vomiting   Dilaudid [Hydromorphone] Nausea And Vomiting   Hydrocodone Nausea And Vomiting   Percocet [Oxycodone-Acetaminophen] Nausea And Vomiting    Family History  Problem Relation Age of Onset   Diabetes Mother    Bipolar disorder Mother    Anxiety disorder Mother    Depression Mother    Lung cancer Mother        lung   Anxiety disorder Sister    Depression Sister    Anxiety disorder Sister    Colon cancer Neg Hx    Breast cancer Neg Hx     Social History   Socioeconomic History   Marital status: Married    Spouse name: Not on file   Number of children: Not on  file   Years of education: High School   Highest education level: High school graduate  Occupational History   Not on file  Tobacco Use   Smoking status: Every Day    Packs/day: 0.40    Years: 32.00    Additional pack years: 0.00    Total pack years: 12.80    Types: Cigarettes   Smokeless tobacco: Never  Vaping Use   Vaping Use: Never used  Substance and Sexual Activity   Alcohol use: Not Currently   Drug use: Never   Sexual activity: Not on file  Other Topics Concern   Not on file  Social History Narrative   Not on file   Social Determinants of Health   Financial Resource Strain: Not on file  Food Insecurity: Not on file  Transportation Needs: Not on file  Physical Activity: Not on file  Stress: Not on file  Social Connections: Not on file  Intimate Partner Violence: Not on file     Constitutional: Patient reports that.  Denies fever, malaise, fatigue, or abrupt weight changes.  HEENT: Patient reports ear pain.  Denies eye pain, eye redness, ringing in the ears, wax buildup, runny nose, nasal congestion, bloody nose, or sore throat. Respiratory: Patient reports cough.  Denies difficulty breathing, shortness of breath, or sputum production.   Cardiovascular: Denies chest pain, chest tightness, palpitations or swelling in the hands or feet.  Gastrointestinal: Denies abdominal pain, bloating, constipation, diarrhea or blood in the stool.   No other specific complaints in a complete review of systems (except as listed in HPI above).  Objective:   Physical Exam BP 112/64 (BP Location: Right Arm, Patient Position: Sitting, Cuff Size: Normal)   Pulse 85   Temp (!) 96.9 F (36.1 C) (Temporal)   Wt 168 lb (76.2 kg)   SpO2 97%   BMI 28.84 kg/m   Wt Readings from Last 3 Encounters:  03/28/23 166 lb (75.3 kg)  02/27/23 170 lb 4.8 oz (77.2 kg)  02/06/23 169 lb 3.2 oz (76.7 kg)    General: Appears her stated age, overweight, in NAD. Skin: Warm, dry and intact.   HEENT: Head: normal shape and size, frontal and maxillary sinus tenderness noted; Eyes: sclera white, no icterus, conjunctiva pink, PERRLA and EOMs intact; Ears: Tm's gray and intact, normal light reflex, + serous effusion bilaterally; Nose: mucosa boggy and moist, septum midline;  Neck: No adenopathy noted. Cardiovascular: Normal rate and rhythm. S1,S2 noted.  No murmur, rubs or gallops noted. \\ Pulmonary/Chest: Normal effort and positive vesicular breath sounds. No respiratory distress. No wheezes, rales or ronchi noted.  Musculoskeletal: No difficulty  with gait.  Neurological: Alert and oriented.    BMET    Component Value Date/Time   NA 141 02/06/2023 1547   K 4.4 02/06/2023 1547   CL 104 02/06/2023 1547   CO2 20 02/06/2023 1547   GLUCOSE 85 02/06/2023 1547   GLUCOSE 117 (H) 08/23/2022 1405   BUN 13 02/06/2023 1547   CREATININE 0.81 02/06/2023 1547   CREATININE 0.83 03/18/2022 0859   CALCIUM 10.0 02/06/2023 1547   GFRNONAA >60 08/23/2022 1405   GFRNONAA 80 03/05/2021 0802   GFRAA 93 03/05/2021 0802    Lipid Panel     Component Value Date/Time   CHOL 174 03/18/2022 0859   TRIG 181 (H) 03/18/2022 0859   HDL 40 (L) 03/18/2022 0859   CHOLHDL 4.4 03/18/2022 0859   LDLCALC 104 (H) 03/18/2022 0859    CBC    Component Value Date/Time   WBC 12.4 (H) 02/06/2023 1547   WBC 11.1 (H) 08/23/2022 1405   RBC 4.82 02/06/2023 1547   RBC 4.57 08/23/2022 1405   HGB 14.7 02/06/2023 1547   HCT 43.0 02/06/2023 1547   PLT 304 08/23/2022 1405   MCV 89 02/06/2023 1547   MCH 30.5 02/06/2023 1547   MCH 28.9 08/23/2022 1405   MCHC 34.2 02/06/2023 1547   MCHC 32.4 08/23/2022 1405   RDW 13.3 02/06/2023 1547   LYMPHSABS 4.8 (H) 02/06/2023 1547   MONOABS 0.5 08/23/2022 1405   EOSABS 0.1 02/06/2023 1547   BASOSABS 0.1 02/06/2023 1547    Hgb A1C Lab Results  Component Value Date   HGBA1C 5.9 (H) 03/18/2022            Assessment & Plan:   Recurrent Sinusitis,  Cough:  There is no evidence of infection, I do not believe she will benefit from repeating antibiotics at this time Offered to refill prednisone however she would like to hold off on this Continue Fexofenadine, Flonase, Pepcid and allergy shots Will obtain CT sinus for further evaluation Rx for Slbuterol 1 to 2 puffs every 4-6 hours as needed for cough Encourage smoking cessation as this can lead to recurrent sinus infections  Follow-up with your PCP as previously scheduled Nicki Reaper, NP

## 2023-04-16 ENCOUNTER — Ambulatory Visit: Admission: RE | Admit: 2023-04-16 | Payer: BC Managed Care – PPO | Source: Ambulatory Visit

## 2023-05-01 ENCOUNTER — Ambulatory Visit (INDEPENDENT_AMBULATORY_CARE_PROVIDER_SITE_OTHER): Payer: BC Managed Care – PPO | Admitting: Allergy

## 2023-05-01 ENCOUNTER — Other Ambulatory Visit: Payer: Self-pay

## 2023-05-01 ENCOUNTER — Ambulatory Visit: Payer: BC Managed Care – PPO

## 2023-05-01 ENCOUNTER — Encounter: Payer: Self-pay | Admitting: Allergy

## 2023-05-01 VITALS — BP 120/68 | HR 84 | Temp 98.4°F | Ht 64.0 in | Wt 158.0 lb

## 2023-05-01 DIAGNOSIS — L501 Idiopathic urticaria: Secondary | ICD-10-CM | POA: Diagnosis not present

## 2023-05-01 DIAGNOSIS — L299 Pruritus, unspecified: Secondary | ICD-10-CM

## 2023-05-01 DIAGNOSIS — B999 Unspecified infectious disease: Secondary | ICD-10-CM

## 2023-05-01 MED ORDER — MONTELUKAST SODIUM 10 MG PO TABS: 10.0000 mg | ORAL_TABLET | Freq: Every day | ORAL | 5 refills | Status: DC

## 2023-05-01 MED ORDER — FAMOTIDINE 20 MG PO TABS: 20.0000 mg | ORAL_TABLET | Freq: Two times a day (BID) | ORAL | 5 refills | Status: DC

## 2023-05-01 NOTE — Patient Instructions (Addendum)
-   continue Allegra 180mg  1 tab up to 4 tabs a day for additional antihistamine response - continue Pepcid 20mg  1 tab twice a day - continue Singulair 10mg  daily - your labwork indicated autoimmune hives/itching - ointments/creams/sprays are usually not helpful - continue Xolair monthly injections.   Will let Tammy know you are interested in at-home administration which can be started after you have received 3 injections in-office.   Continue access to epipen while on Xolair.  - you can still use benadryl as needed for breaththrough symptoms   Once you have been on Xolair at steady state (around 3-4+ months) and symptom free you can try to wean your oral medications as below: - for hive control recommend use of high-dose antihistamine regimen:  Allegra 180mg  twice a day and famotidine 20 mg (Pepcid) twice a day and Singulair 10mg  daily. If no symptoms for 7-14 days then decrease to. Allegra 180mg   twice a day and famotidine 20 mg (Pepcid) once a day and Singulair 10mg  daily.  If no symptoms for 7-14 days then decrease to. Allegra 180mg   twice a day and Singulair 10mg  daily.  If no symptoms for 7-14 days then decrease to. Allegra 180mg  once a day and Singulair 10mg  daily.  If no symptoms for 7-14 days then decrease to. Allegra 180mg  once a day.  If no symptoms for 7-14 days then stop Allegra.         If symptoms return, then resume last dose removed   - recommend getting your pneumonia vaccine (pneumovax preferred).   After that will monitor your frequency of infections.  We have some pneumovax in our office if you PCP is no longer carrying it.   Follow-up in about 3-4 months or sooner if needed

## 2023-05-01 NOTE — Progress Notes (Signed)
Follow-up Note  RE: Amy Hutchinson MRN: [id number] DOB: December 09, 1960 Date of Office Visit: 05/01/2023   History of present illness: Amy Hutchinson is a 62 y.o. 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  She was last seen in the office on 02/19/2023 by myself. She presents today with her husband.   She was started on Xolair and as of today has had 2 injections.  After first injection she has already noted improvement in symptoms with less itching and if it does occur it is quick lasting.  This is a big improvement than symptoms before.  She continues to take Allegra twice a day, Pepcid twice a day and Singulair daily.  She is quite pleased thus far with the results she has seen.  She has not received the pneumonia vaccine yet.  She did denies needing antibiotics since the last visit but states she does keep getting sinus infections.  She states her PCP wanted her to have a sinus CT to see if she needed her "sinuses scraped" but her insurance/imaging center is wanting her to pay upfront cost.  Review of systems: Review of Systems  Constitutional: Negative.   HENT: Negative.    Eyes: Negative.   Respiratory: Negative.    Cardiovascular: Negative.   Gastrointestinal: Negative.   Musculoskeletal: Negative.   Skin: Negative.   Allergic/Immunologic: Negative.   Neurological: Negative.      All other systems negative unless noted above in HPI  Past medical/social/surgical/family history have been reviewed and are unchanged unless specifically indicated below.  No changes  Medication List: Current Outpatient Medications  Medication Sig Dispense Refill   albuterol (VENTOLIN HFA) 108 (90 Base) MCG/ACT inhaler Inhale 1-2 puffs into the lungs every 6 (six) hours as needed for wheezing or shortness of breath. 8 g 0   alprazolam (XANAX) 2 MG tablet Take 0.5 tablets (1 mg total) by mouth 4 (four) times daily as needed for sleep or anxiety. 60 tablet 0    citalopram (CELEXA) 40 MG tablet Take 1 tablet (40 mg total) by mouth daily. 30 tablet    EPINEPHrine 0.3 mg/0.3 mL IJ SOAJ injection Inject 0.3 mg into the muscle as needed for anaphylaxis. 1 each 2   famotidine (PEPCID) 20 MG tablet Take 1 tablet (20 mg total) by mouth 2 (two) times daily. 60 tablet 3   famotidine (PEPCID) 20 MG tablet Take 1 tablet (20 mg total) by mouth 2 (two) times daily. 60 tablet 5   fexofenadine (ALLEGRA) 180 MG tablet Take 180 mg by mouth daily.     montelukast (SINGULAIR) 10 MG tablet Take 1 tablet (10 mg total) by mouth at bedtime. 30 tablet 5   montelukast (SINGULAIR) 10 MG tablet Take 1 tablet (10 mg total) by mouth at bedtime. 30 tablet 5   QUEtiapine (SEROQUEL) 100 MG tablet Take 1 tablet (100 mg total) by mouth at bedtime. 30 tablet 2   rosuvastatin (CRESTOR) 10 MG tablet TAKE 1 TABLET BY MOUTH AT BEDTIME (STOP  SIMVASTATIN) 90 tablet 0   EPITOL 200 MG tablet Take by mouth. (Patient not taking: Reported on 02/06/2023)     fluticasone (FLONASE) 50 MCG/ACT nasal spray Place 2 sprays into both nostrils daily. Use for 4-6 weeks then stop and use seasonally or as needed. (Patient not taking: Reported on 04/14/2023) 16 g 3   hydrOXYzine (ATARAX) 50 MG tablet Take 1 tablet (50 mg total) by mouth 3 (three) times daily as needed. (Patient not taking:  Reported on 05/01/2023) 90 tablet 3   meclizine (ANTIVERT) 25 MG tablet Take 1 tablet (25 mg total) by mouth 3 (three) times daily as needed for dizziness. (Patient not taking: Reported on 05/01/2023) 90 tablet 2   Current Facility-Administered Medications  Medication Dose Route Frequency Provider Last Rate Last Admin   omalizumab Amy Hutchinson) prefilled syringe 300 mg  300 mg Subcutaneous Q28 days Marcelyn Bruins, MD   300 mg at 05/01/23 1549     Known medication allergies: Allergies  Allergen Reactions   Codeine Nausea And Vomiting   Darvon [Propoxyphene] Nausea And Vomiting   Dilaudid [Hydromorphone] Nausea And  Vomiting   Hydrocodone Nausea And Vomiting   Percocet [Oxycodone-Acetaminophen] Nausea And Vomiting     Physical examination: Blood pressure 120/68, pulse 84, temperature 98.4 F (36.9 C), temperature source Oral, height 5\' 4"  (1.626 m), weight 158 lb (71.7 kg), SpO2 94 %.  General: Alert, interactive, in no acute distress. HEENT: PERRLA, TMs pearly gray, turbinates non-edematous without discharge, post-pharynx non erythematous. Neck: Supple without lymphadenopathy. Lungs: Clear to auscultation without wheezing, rhonchi or rales. {no increased work of breathing. CV: Normal S1, S2 without murmurs. Abdomen: Nondistended, nontender. Skin: Warm and dry, without lesions or rashes. Extremities:  No clubbing, cyanosis or edema. Neuro:   Grossly intact.  Diagnositics/Labs: None today   Assessment and plan: Pruritus History of Urticaria Recurrent respiratory tract infections   - continue Allegra 180mg  1 tab up to 4 tabs a day for additional antihistamine response - continue Pepcid 20mg  1 tab twice a day - continue Singulair 10mg  daily - your labwork indicated autoimmune hives/itching - ointments/creams/sprays are usually not helpful - continue Xolair monthly injections.   Will let Amy Hutchinson know you are interested in at-home administration which can be started after you have received 3 injections in-office.   Continue access to epipen while on Xolair.  - you can still use benadryl as needed for breaththrough symptoms   Once you have been on Xolair at steady state (around 3-4+ months) and symptom free you can try to wean your oral medications as below: - for hive control recommend use of high-dose antihistamine regimen:  Allegra 180mg  twice a day and famotidine 20 mg (Pepcid) twice a day and Singulair 10mg  daily. If no symptoms for 7-14 days then decrease to. Allegra 180mg   twice a day and famotidine 20 mg (Pepcid) once a day and Singulair 10mg  daily.  If no symptoms for 7-14 days then  decrease to. Allegra 180mg   twice a day and Singulair 10mg  daily.  If no symptoms for 7-14 days then decrease to. Allegra 180mg  once a day and Singulair 10mg  daily.  If no symptoms for 7-14 days then decrease to. Allegra 180mg  once a day.  If no symptoms for 7-14 days then stop Allegra.         If symptoms return, then resume last dose removed   - recommend getting your pneumonia vaccine (pneumovax preferred).   After that will monitor your frequency of infections.  We have some pneumovax in our office if you PCP is no longer carrying it.   Follow-up in about 3-4 months or sooner if needed  I appreciate the opportunity to take part in Amy Hutchinson's care. Please do not hesitate to contact me with questions.  Sincerely,   Amy Aye, MD Allergy/Immunology [facility]

## 2023-05-28 ENCOUNTER — Ambulatory Visit (INDEPENDENT_AMBULATORY_CARE_PROVIDER_SITE_OTHER): Payer: BC Managed Care – PPO

## 2023-05-28 DIAGNOSIS — L501 Idiopathic urticaria: Secondary | ICD-10-CM | POA: Diagnosis not present

## 2023-05-30 DIAGNOSIS — F410 Panic disorder [episodic paroxysmal anxiety] without agoraphobia: Secondary | ICD-10-CM | POA: Diagnosis not present

## 2023-05-30 DIAGNOSIS — F411 Generalized anxiety disorder: Secondary | ICD-10-CM | POA: Diagnosis not present

## 2023-05-30 DIAGNOSIS — F3340 Major depressive disorder, recurrent, in remission, unspecified: Secondary | ICD-10-CM | POA: Diagnosis not present

## 2023-05-31 ENCOUNTER — Other Ambulatory Visit: Payer: Self-pay | Admitting: Family Medicine

## 2023-05-31 DIAGNOSIS — E782 Mixed hyperlipidemia: Secondary | ICD-10-CM

## 2023-06-02 NOTE — Telephone Encounter (Signed)
Requested medications are due for refill today.  yes  Requested medications are on the active medications list.  yes  Last refill. 03/04/2023 #90 0 rf  Future visit scheduled.   no  Notes to clinic.  Labs are expired.    Requested Prescriptions  Pending Prescriptions Disp Refills   rosuvastatin (CRESTOR) 10 MG tablet [Pharmacy Med Name: Rosuvastatin Calcium 10 MG Oral Tablet] 90 tablet 0    Sig: TAKE 1 TABLET BY MOUTH AT BEDTIME (STOP  SIMVASTATIN)     Cardiovascular:  Antilipid - Statins 2 Failed - 05/31/2023  6:45 AM      Failed - Lipid Panel in normal range within the last 12 months    Cholesterol  Date Value Ref Range Status  03/18/2022 174 <200 mg/dL Final   LDL Cholesterol (Calc)  Date Value Ref Range Status  03/18/2022 104 (H) mg/dL (calc) Final    Comment:    Reference range: <100 . Desirable range <100 mg/dL for primary prevention;   <70 mg/dL for patients with CHD or diabetic patients  with > or = 2 CHD risk factors. Marland Kitchen LDL-C is now calculated using the Martin-Hopkins  calculation, which is a validated novel method providing  better accuracy than the Friedewald equation in the  estimation of LDL-C.  Horald Pollen et al. Lenox Ahr. 1610;960(45): 2061-2068  (http://education.[employer].com/faq/FAQ164)    HDL  Date Value Ref Range Status  03/18/2022 40 (L) > OR = 50 mg/dL Final   Triglycerides  Date Value Ref Range Status  03/18/2022 181 (H) <150 mg/dL Final         Passed - Cr in normal range and within 360 days    Creat  Date Value Ref Range Status  03/18/2022 0.83 0.50 - 1.03 mg/dL Final   Creatinine, Ser  Date Value Ref Range Status  02/06/2023 0.81 0.57 - 1.00 mg/dL Final         Passed - Patient is not pregnant      Passed - Valid encounter within last 12 months    Recent Outpatient Visits           1 month ago Recurrent sinusitis   [place] [facility] [place], Salvadore Oxford, NP   2 months ago Viral sinusitis   [place]  [facility] [place], Salvadore Oxford, NP   5 months ago Pruritus   [facility] Smitty Cords, DO   7 months ago Pruritus   [facility] [place], Salvadore Oxford, NP   8 months ago Acute non-recurrent frontal sinusitis   [place] [facility] [place], Netta Neat, [place]

## 2023-06-25 ENCOUNTER — Ambulatory Visit: Payer: BC Managed Care – PPO

## 2023-07-04 ENCOUNTER — Ambulatory Visit (INDEPENDENT_AMBULATORY_CARE_PROVIDER_SITE_OTHER): Payer: BC Managed Care – PPO

## 2023-07-04 DIAGNOSIS — H04123 Dry eye syndrome of bilateral lacrimal glands: Secondary | ICD-10-CM | POA: Diagnosis not present

## 2023-07-04 DIAGNOSIS — L501 Idiopathic urticaria: Secondary | ICD-10-CM | POA: Diagnosis not present

## 2023-07-04 DIAGNOSIS — H0259 Other disorders affecting eyelid function: Secondary | ICD-10-CM | POA: Diagnosis not present

## 2023-07-09 ENCOUNTER — Ambulatory Visit: Payer: Self-pay

## 2023-07-09 NOTE — Telephone Encounter (Signed)
Summary: covid?   Pt states that she think she has covid. Her husband had it a week ago. Pt has body aches, head hurt, and ear ache, a virtual appt was made for tomorrow morning at 8:40am. Pt is wanting to know what to do in the mean time.       Chief Complaint: requesting recommendations for sx until appt tomorrow s/p covid sx Symptoms: cough productive, clear to yellow sputum, weakness when standing , dizziness. Reports she is drinking fluids reports taking at home covid test 2 weeks ago when husband tested positive. Sx not getting better Frequency: 2 weeks  Pertinent Negatives: Patient denies chest pain no difficulty breathing no fever Disposition: [] ED /[] Urgent Care (no appt availability in office) / [x] Appointment(In office/virtual)/ []  [place] [facility]/ [] Home Care/ [] Refused Recommended Disposition /[] [place] [facility]/ []  Follow-up with PCP Additional Notes:   Recommended to increase fluids , water , gatorade, pedialyte if possible. Eat small meals. If sx worsen call back or go to ED.       Reason for Disposition  [1] HIGH RISK patient (e.g., weak immune system, age > 64 years, obesity with BMI 30 or higher, pregnant, chronic lung disease or other chronic medical condition) AND [2] COVID symptoms (e.g., cough, fever)  (Exceptions: Already seen by PCP and no new or worsening symptoms.)  Answer Assessment - Initial Assessment Questions 1. COVID-19 DIAGNOSIS: "How do you know that you have COVID?" (e.g., positive lab test or self-test, diagnosed by doctor or NP/PA, symptoms after exposure).     At home covid test 2 weeks ago .  2. COVID-19 EXPOSURE: "Was there any known exposure to COVID before the symptoms began?" CDC Definition of close contact: within 6 feet (2 meters) for a total of 15 minutes or more over a 24-hour period.      Yes reports husband had covid 2 weeks ago and was positive  3. ONSET: "When did the COVID-19 symptoms start?"      2 weeks ago  4. WORST  SYMPTOM: "What is your worst symptom?" (e.g., cough, fever, shortness of breath, muscle aches)     Cough , productive clear to yellow. , weakness, dizziness. 5. COUGH: "Do you have a cough?" If Yes, ask: "How bad is the cough?"       Yes  6. FEVER: "Do you have a fever?" If Yes, ask: "What is your temperature, how was it measured, and when did it start?"     na 7. RESPIRATORY STATUS: "Describe your breathing?" (e.g., normal; shortness of breath, wheezing, unable to speak)      na 8. BETTER-SAME-WORSE: "Are you getting better, staying the same or getting worse compared to yesterday?"  If getting worse, ask, "In what way?"     Not getting better  9. OTHER SYMPTOMS: "Do you have any other symptoms?"  (e.g., chills, fatigue, headache, loss of smell or taste, muscle pain, sore throat)     Weakness , dizziness.  10. HIGH RISK DISEASE: "Do you have any chronic medical problems?" (e.g., asthma, heart or lung disease, weak immune system, obesity, etc.)       See hx  11. VACCINE: "Have you had the COVID-19 vaccine?" If Yes, ask: "Which one, how many shots, when did you get it?"       na 12. PREGNANCY: "Is there any chance you are pregnant?" "When was your last menstrual period?"       na 13. O2 SATURATION MONITOR:  "Do you use an oxygen  saturation monitor (pulse oximeter) at home?" If Yes, ask "What is your reading (oxygen level) today?" "What is your usual oxygen saturation reading?" (e.g., 95%)       na  Protocols used: Coronavirus (COVID-19) Diagnosed or Suspected-A-AH

## 2023-07-09 NOTE — Telephone Encounter (Signed)
Pt states that she think she has covid. Her husband had it a week ago. Pt has body aches, head hurt, and ear ache, a virtual appt was made for tomorrow morning at 8:40am. Pt is wanting to know what to do in the mean time.   Left message to call back about symptoms.

## 2023-07-10 ENCOUNTER — Encounter: Payer: Self-pay | Admitting: Internal Medicine

## 2023-07-10 ENCOUNTER — Ambulatory Visit: Payer: Self-pay

## 2023-07-10 ENCOUNTER — Other Ambulatory Visit: Payer: Self-pay

## 2023-07-10 ENCOUNTER — Telehealth (INDEPENDENT_AMBULATORY_CARE_PROVIDER_SITE_OTHER): Payer: BC Managed Care – PPO | Admitting: Internal Medicine

## 2023-07-10 ENCOUNTER — Emergency Department
Admission: EM | Admit: 2023-07-10 | Discharge: 2023-07-10 | Disposition: A | Discharge status: Home / Self Care | Payer: BC Managed Care – PPO | Attending: Student in an Organized Health Care Education/Training Program | Admitting: Student in an Organized Health Care Education/Training Program

## 2023-07-10 DIAGNOSIS — Z8616 Personal history of COVID-19: Secondary | ICD-10-CM | POA: Insufficient documentation

## 2023-07-10 DIAGNOSIS — J0110 Acute frontal sinusitis, unspecified: Secondary | ICD-10-CM | POA: Diagnosis not present

## 2023-07-10 DIAGNOSIS — R059 Cough, unspecified: Secondary | ICD-10-CM | POA: Diagnosis not present

## 2023-07-10 DIAGNOSIS — U071 COVID-19: Secondary | ICD-10-CM | POA: Diagnosis not present

## 2023-07-10 DIAGNOSIS — H9201 Otalgia, right ear: Secondary | ICD-10-CM | POA: Diagnosis not present

## 2023-07-10 DIAGNOSIS — J069 Acute upper respiratory infection, unspecified: Secondary | ICD-10-CM

## 2023-07-10 LAB — CBC
HCT: 44.3 % (ref 36.0–46.0)
Hemoglobin: 14.5 g/dL (ref 12.0–15.0)
MCH: 29.5 pg (ref 26.0–34.0)
MCHC: 32.7 g/dL (ref 30.0–36.0)
MCV: 90.2 fL (ref 80.0–100.0)
Platelets: 346 10*3/uL (ref 150–400)
RBC: 4.91 MIL/uL (ref 3.87–5.11)
RDW: 13.9 % (ref 11.5–15.5)
WBC: 12.7 10*3/uL — ABNORMAL HIGH (ref 4.0–10.5)
nRBC: 0 % (ref 0.0–0.2)

## 2023-07-10 LAB — BASIC METABOLIC PANEL
Anion gap: 10 (ref 5–15)
BUN: 14 mg/dL (ref 6–20)
CO2: 23 mmol/L (ref 22–32)
Calcium: 9.4 mg/dL (ref 8.9–10.3)
Chloride: 107 mmol/L (ref 98–111)
Creatinine, Ser: 0.71 mg/dL (ref 0.44–1.00)
GFR, Estimated: >60 mL/min (ref >60)
Glucose, Bld: 140 mg/dL — ABNORMAL HIGH (ref 70–99)
Potassium: 3.8 mmol/L (ref 3.5–5.1)
Sodium: 140 mmol/L (ref 135–145)

## 2023-07-10 MED ORDER — PREDNISONE 20 MG PO TABS: 20.0000 mg | ORAL_TABLET | Freq: Every day | ORAL | 0 refills | Status: AC

## 2023-07-10 MED ORDER — AMOXICILLIN-POT CLAVULANATE 400-57 MG/5ML PO SUSR: 875.0000 mg | Freq: Two times a day (BID) | ORAL | 0 refills | Status: AC

## 2023-07-10 MED ORDER — AZITHROMYCIN 250 MG PO TABS: ORAL_TABLET | ORAL | 0 refills | Status: DC

## 2023-07-10 NOTE — Discharge Instructions (Signed)
Please take antibiotics as prescribed as well as oral steroids as prescribed.  You may take Tylenol for additional pain relief.  Return to the ER for any fevers increasing pain swelling worsening symptoms or any urgent changes in your health.

## 2023-07-10 NOTE — Patient Instructions (Signed)

## 2023-07-10 NOTE — Telephone Encounter (Signed)
     Chief Complaint: Sudden swelling to right ear, goes down to her jaw. Had VV today and has started antibiotic. "This is scaring me." Symptoms: Swelling, pain 8/10 Frequency: This afternoon Pertinent Negatives: Patient denies fever Disposition: [] ED /[x] Urgent Care (no appt availability in office) / [] Appointment(In office/virtual)/ []  [place] [facility]/ [] Home Care/ [] Refused Recommended Disposition /[] [place] [facility]/ []  Follow-up with PCP Additional Notes: Pt. States she will have husband take her to ED.  Reason for Disposition  [1] SEVERE pain AND [2] not improved 2 hours after taking analgesic medication (e.g., ibuprofen or acetaminophen)  Answer Assessment - Initial Assessment Questions 1. LOCATION: "Which ear is involved?"     Right  2. ONSET: "When did the ear start hurting"      Swelling today 3. SEVERITY: "How bad is the pain?"  (Scale 1-10; mild, moderate or severe)   - MILD (1-3): doesn't interfere with normal activities    - MODERATE (4-7): interferes with normal activities or awakens from sleep    - SEVERE (8-10): excruciating pain, unable to do any normal activities      8 4. URI SYMPTOMS: "Do you have a runny nose or cough?"     Yes 5. FEVER: "Do you have a fever?" If Yes, ask: "What is your temperature, how was it measured, and when did it start?"     No 6. CAUSE: "Have you been swimming recently?", "How often do you use Q-TIPS?", "Have you had any recent air travel or scuba diving?"     No 7. OTHER SYMPTOMS: "Do you have any other symptoms?" (e.g., headache, stiff neck, dizziness, vomiting, runny nose, decreased hearing)     Congestion, COVID positive 8. PREGNANCY: "Is there any chance you are pregnant?" "When was your last menstrual period?"     No  Protocols used: Davina Poke

## 2023-07-10 NOTE — ED Triage Notes (Signed)
Pt to ED for COVID + 2 weeks ago, states does not feel like getting better. Also reports feels like right side of face is swollen near right ear, no significant swelling noted.

## 2023-07-10 NOTE — Telephone Encounter (Signed)
Will discuss at upcoming appt today 

## 2023-07-10 NOTE — ED Provider Notes (Addendum)
[place] EMERGENCY DEPARTMENT AT [facility] Provider Note   CSN: [id number] Arrival date & time: 07/10/23  1602     History  Chief Complaint  Patient presents with   Cough    Amy Hutchinson is a 62 y.o. female presents to the emergency department for evaluation of right sided ear pain, sinus pain and pressure.  She was diagnosed with COVID 2 weeks ago, cough is much improved.  She states earlier today she noted some swelling along the right side of her face/ear with pressure.  She states this has been improving throughout the day but has had some sinus pain and pressure.  She denies any chest pain or shortness of breath.  No fevers.  No drainage from the right ear.  No warmth or redness HPI     Home Medications Prior to Admission medications   Medication Sig Start Date End Date Taking? Authorizing Provider  amoxicillin-clavulanate (AUGMENTIN) 400-57 MG/5ML suspension Take 10.9 mLs (875 mg total) by mouth 2 (two) times daily for 10 days. 07/10/23 07/20/23 Yes Evon Slack, PA-C  predniSONE (DELTASONE) 20 MG tablet Take 1 tablet (20 mg total) by mouth daily for 5 days. 07/10/23 07/15/23 Yes Evon Slack, PA-C  albuterol (VENTOLIN HFA) 108 (90 Base) MCG/ACT inhaler Inhale 1-2 puffs into the lungs every 6 (six) hours as needed for wheezing or shortness of breath. 04/14/23   Lorre Munroe, NP  alprazolam Prudy Feeler) 2 MG tablet Take 0.5 tablets (1 mg total) by mouth 4 (four) times daily as needed for sleep or anxiety. 08/31/20   Karamalegos, Netta Neat, DO  azithromycin (ZITHROMAX) 250 MG tablet Take 2 tabs today, then 1 tab daily x 4 days 07/10/23   Lorre Munroe, NP  citalopram (CELEXA) 40 MG tablet Take 1 tablet (40 mg total) by mouth daily. 03/06/21   Karamalegos, Netta Neat, DO  EPINEPHrine 0.3 mg/0.3 mL IJ SOAJ injection Inject 0.3 mg into the muscle as needed for anaphylaxis. 02/27/23   Marcelyn Bruins, MD  EPITOL 200 MG tablet Take by mouth. Patient not taking:  Reported on 02/06/2023 02/22/22   [provider]  famotidine (PEPCID) 20 MG tablet Take 1 tablet (20 mg total) by mouth 2 (two) times daily. 02/06/23   Marcelyn Bruins, MD  famotidine (PEPCID) 20 MG tablet Take 1 tablet (20 mg total) by mouth 2 (two) times daily. 05/01/23   Marcelyn Bruins, MD  fexofenadine (ALLEGRA) 180 MG tablet Take 180 mg by mouth daily.    [provider]  fluticasone (FLONASE) 50 MCG/ACT nasal spray Place 2 sprays into both nostrils daily. Use for 4-6 weeks then stop and use seasonally or as needed. Patient not taking: Reported on 04/14/2023 12/31/22   Smitty Cords, DO  hydrOXYzine (ATARAX) 50 MG tablet Take 1 tablet (50 mg total) by mouth 3 (three) times daily as needed. Patient not taking: Reported on 05/01/2023 12/31/22   Smitty Cords, DO  meclizine (ANTIVERT) 25 MG tablet Take 1 tablet (25 mg total) by mouth 3 (three) times daily as needed for dizziness. Patient not taking: Reported on 05/01/2023 02/11/22   Smitty Cords, DO  montelukast (SINGULAIR) 10 MG tablet Take 1 tablet (10 mg total) by mouth at bedtime. 02/21/23 05/01/23  Alfonse Spruce, MD  montelukast (SINGULAIR) 10 MG tablet Take 1 tablet (10 mg total) by mouth at bedtime. 05/01/23   Marcelyn Bruins, MD  QUEtiapine (SEROQUEL) 100 MG tablet Take 1 tablet (100 mg total)  by mouth at bedtime. 06/28/20   Karamalegos, Netta Neat, DO  rosuvastatin (CRESTOR) 10 MG tablet Take 1 tablet (10 mg total) by mouth at bedtime. 06/02/23   Karamalegos, Netta Neat, DO      Allergies    Codeine, Darvon [propoxyphene], Dilaudid [hydromorphone], Hydrocodone, and Percocet [oxycodone-acetaminophen]    Review of Systems   Review of Systems  Physical Exam Updated Vital Signs BP 132/86   Pulse 88   Temp 98.8 F (37.1 C)   Resp 18   Ht 5\' 4"  (1.626 m)   Wt 73.5 kg   SpO2 95%   BMI 27.81 kg/m  Physical Exam Constitutional:      Appearance: She is  well-developed.  HENT:     Head: Normocephalic and atraumatic.     Comments: Positive frontal and maxillary sinus tenderness bilaterally.    Right Ear: Tympanic membrane, ear canal and external ear normal.     Left Ear: Ear canal and external ear normal.     Ears:     Comments: Nonerythematous right TM but significant fluid present.  There is no drainage.  No TM rupture.  Canal is normal.  No mastoid tenderness.  Patient has some mild palpable lymphadenopathy along the preauricle region.  There is no warmth, redness.  No trismus. Eyes:     Conjunctiva/sclera: Conjunctivae normal.  Cardiovascular:     Rate and Rhythm: Normal rate.  Pulmonary:     Effort: Pulmonary effort is normal. No respiratory distress.     Breath sounds: Normal breath sounds. No wheezing or rales.  Abdominal:     General: There is no distension.     Tenderness: There is no abdominal tenderness. There is no guarding.  Musculoskeletal:        General: Normal range of motion.     Cervical back: Normal range of motion.  Skin:    General: Skin is warm.     Findings: No rash.  Neurological:     Mental Status: She is alert and oriented to person, place, and time.  Psychiatric:        Behavior: Behavior normal.        Thought Content: Thought content normal.     ED Results / Procedures / Treatments   Labs (all labs ordered are listed, but only abnormal results are displayed) Labs Reviewed  CBC - Abnormal; Notable for the following components:      Result Value   WBC 12.7 (*)    All other components within normal limits  BASIC METABOLIC PANEL - Abnormal; Notable for the following components:   Glucose, Bld 140 (*)    All other components within normal limits    EKG None  Radiology No results found.  Procedures Procedures    Medications Ordered in ED Medications - No data to display  ED Course/ Medical Decision Making/ A&P                                 Medical Decision Making Amount and/or  Complexity of Data Reviewed Labs: ordered.  Risk Prescription drug management.  62 year old female with COVID.  She is here today for right sided ear/facial and sinus pain.  She is afebrile.  Her cough/COVID symptoms had been improving.  She is tolerating p.o. well.  She has significant facial and sinus tenderness on exam.  Right ear shows significant fluid present with no signs of any infectious process.  Will treat  with Augmentin for sinusitis as well as give prednisone to help with inflammation and treat pain.  Recommend she follow-up for any worsening symptoms or any urgent changes in her health.  Lungs are clear to auscultation.   Final Clinical Impression(s) / ED Diagnoses Final diagnoses:  Acute non-recurrent frontal sinusitis  Right ear pain    Rx / DC Orders ED Discharge Orders          Ordered    predniSONE (DELTASONE) 20 MG tablet  Daily        07/10/23 1725    amoxicillin-clavulanate (AUGMENTIN) 400-57 MG/5ML suspension  2 times daily        07/10/23 1725              Evon Slack, PA-C 07/10/23 1731    Evon Slack, PA-C 07/10/23 1732    Willy Eddy, MD 07/10/23 1931

## 2023-07-10 NOTE — Progress Notes (Signed)
Virtual Visit via Video Note  I connected with Amy Hutchinson on 07/10/23 at  8:40 AM EDT by a video enabled telemedicine application and verified that I am speaking with the correct person using two identifiers.  Location: Patient: Home Provider: Home  Persons participating in this video call: Nicki Reaper, NP and Amy Hutchinson   I discussed the limitations of evaluation and management by telemedicine and the availability of in person appointments. The patient expressed understanding and agreed to proceed.  History of Present Illness:  Patient reports headache, runny nose, nasal congestion, ear pain, sore throat, cough, nausea and bodyaches.  She reports this started 2 weeks ago.  The headache is located in her forehead.  She describes the pain as pressure.  She is blowing yellow mucus out of her nose.  She denies difficulty swallowing.  The cough is productive at times.  She denies shortness of breath, chest pain, vomiting, diarrhea.  She has had fever and bodyaches but denies chills.  She has tried tylenol and robitussin OTC with some relief of symptoms.  She has had exposure to COVID by her husband and she also tested positive.   Past Medical History:  Diagnosis Date   Anxiety    Back pain    Depression    Hyperlipidemia    Recurrent upper respiratory infection (URI)     Current Outpatient Medications  Medication Sig Dispense Refill   albuterol (VENTOLIN HFA) 108 (90 Base) MCG/ACT inhaler Inhale 1-2 puffs into the lungs every 6 (six) hours as needed for wheezing or shortness of breath. 8 g 0   alprazolam (XANAX) 2 MG tablet Take 0.5 tablets (1 mg total) by mouth 4 (four) times daily as needed for sleep or anxiety. 60 tablet 0   citalopram (CELEXA) 40 MG tablet Take 1 tablet (40 mg total) by mouth daily. 30 tablet    EPINEPHrine 0.3 mg/0.3 mL IJ SOAJ injection Inject 0.3 mg into the muscle as needed for anaphylaxis. 1 each 2   EPITOL 200 MG tablet Take by mouth. (Patient not taking:  Reported on 02/06/2023)     famotidine (PEPCID) 20 MG tablet Take 1 tablet (20 mg total) by mouth 2 (two) times daily. 60 tablet 3   famotidine (PEPCID) 20 MG tablet Take 1 tablet (20 mg total) by mouth 2 (two) times daily. 60 tablet 5   fexofenadine (ALLEGRA) 180 MG tablet Take 180 mg by mouth daily.     fluticasone (FLONASE) 50 MCG/ACT nasal spray Place 2 sprays into both nostrils daily. Use for 4-6 weeks then stop and use seasonally or as needed. (Patient not taking: Reported on 04/14/2023) 16 g 3   hydrOXYzine (ATARAX) 50 MG tablet Take 1 tablet (50 mg total) by mouth 3 (three) times daily as needed. (Patient not taking: Reported on 05/01/2023) 90 tablet 3   meclizine (ANTIVERT) 25 MG tablet Take 1 tablet (25 mg total) by mouth 3 (three) times daily as needed for dizziness. (Patient not taking: Reported on 05/01/2023) 90 tablet 2   montelukast (SINGULAIR) 10 MG tablet Take 1 tablet (10 mg total) by mouth at bedtime. 30 tablet 5   montelukast (SINGULAIR) 10 MG tablet Take 1 tablet (10 mg total) by mouth at bedtime. 30 tablet 5   QUEtiapine (SEROQUEL) 100 MG tablet Take 1 tablet (100 mg total) by mouth at bedtime. 30 tablet 2   rosuvastatin (CRESTOR) 10 MG tablet Take 1 tablet (10 mg total) by mouth at bedtime. 90 tablet 3   Current Facility-Administered  Medications  Medication Dose Route Frequency Provider Last Rate Last Admin   omalizumab Geoffry Paradise) prefilled syringe 300 mg  300 mg Subcutaneous Q28 days Marcelyn Bruins, MD   300 mg at 07/04/23 1326    Allergies  Allergen Reactions   Codeine Nausea And Vomiting   Darvon [Propoxyphene] Nausea And Vomiting   Dilaudid [Hydromorphone] Nausea And Vomiting   Hydrocodone Nausea And Vomiting   Percocet [Oxycodone-Acetaminophen] Nausea And Vomiting    Family History  Problem Relation Age of Onset   Diabetes Mother    Bipolar disorder Mother    Anxiety disorder Mother    Depression Mother    Lung cancer Mother        lung   Anxiety  disorder Sister    Depression Sister    Anxiety disorder Sister    Colon cancer Neg Hx    Breast cancer Neg Hx     Social History   Socioeconomic History   Marital status: Married    Spouse name: Not on file   Number of children: Not on file   Years of education: High School   Highest education level: High school graduate  Occupational History   Not on file  Tobacco Use   Smoking status: Every Day    Current packs/day: 0.40    Average packs/day: 0.4 packs/day for 32.0 years (12.8 ttl pk-yrs)    Types: Cigarettes   Smokeless tobacco: Never  Vaping Use   Vaping status: Never Used  Substance and Sexual Activity   Alcohol use: Not Currently   Drug use: Never   Sexual activity: Not on file  Other Topics Concern   Not on file  Social History Narrative   Not on file   Social Determinants of Health   Financial Resource Strain: Not on file  Food Insecurity: Not on file  Transportation Needs: Not on file  Physical Activity: Not on file  Stress: Not on file  Social Connections: Unknown (11/30/2022)   Received from [facility]   Social Network    Social Network: Not on file  Intimate Partner Violence: Unknown (11/30/2022)   Received from [facility]   HITS    Physically Hurt: Not on file    Insult or Talk Down To: Not on file    Threaten Physical Harm: Not on file    Scream or Curse: Not on file     Constitutional: Patient reports headache, fever and fatigue.  Denies abrupt weight changes.  HEENT: Patient reports runny nose, nasal congestion, ear pain and sore throat.  Denies eye pain, eye redness, ringing in the ears, wax buildup or bloody nose. Respiratory: Patient reports cough.  Denies difficulty breathing, shortness of breath.   Cardiovascular: Denies chest pain, chest tightness, palpitations or swelling in the hands or feet.  Gastrointestinal: Patient reports nausea.  Denies abdominal pain, bloating, constipation, diarrhea or blood in the stool.  GU: Denies  urgency, frequency, pain with urination, burning sensation, blood in urine, odor or discharge. Musculoskeletal: Patient reports body aches.  Denies decrease in range of motion, difficulty with gait, or joint swelling.  Skin: Denies redness, rashes, lesions or ulcercations.  Neurological: Denies dizziness, difficulty with memory, difficulty with speech or problems with balance and coordination.  Psych: Denies anxiety, depression, SI/HI.  No other specific complaints in a complete review of systems (except as listed in HPI above).    Observations/Objective:   Wt Readings from Last 3 Encounters:  05/01/23 158 lb (71.7 kg)  04/14/23 168 lb (76.2  kg)  03/28/23 166 lb (75.3 kg)    General: Appears her stated age, appears unwell but in NAD. HEENT: Head: normal shape and size;  Nose: Congestion noted; Throat/Mouth: Hoarseness noted Pulmonary/Chest: Normal effort.  No cough noted.  No respiratory distress.   Neurological: Alert and oriented.   BMET    Component Value Date/Time   NA 141 02/06/2023 1547   K 4.4 02/06/2023 1547   CL 104 02/06/2023 1547   CO2 20 02/06/2023 1547   GLUCOSE 85 02/06/2023 1547   GLUCOSE 117 (H) 08/23/2022 1405   BUN 13 02/06/2023 1547   CREATININE 0.81 02/06/2023 1547   CREATININE 0.83 03/18/2022 0859   CALCIUM 10.0 02/06/2023 1547   GFRNONAA >60 08/23/2022 1405   GFRNONAA 80 03/05/2021 0802   GFRAA 93 03/05/2021 0802    Lipid Panel     Component Value Date/Time   CHOL 174 03/18/2022 0859   TRIG 181 (H) 03/18/2022 0859   HDL 40 (L) 03/18/2022 0859   CHOLHDL 4.4 03/18/2022 0859   LDLCALC 104 (H) 03/18/2022 0859    CBC    Component Value Date/Time   WBC 12.4 (H) 02/06/2023 1547   WBC 11.1 (H) 08/23/2022 1405   RBC 4.82 02/06/2023 1547   RBC 4.57 08/23/2022 1405   HGB 14.7 02/06/2023 1547   HCT 43.0 02/06/2023 1547   PLT 304 08/23/2022 1405   MCV 89 02/06/2023 1547   MCH 30.5 02/06/2023 1547   MCH 28.9 08/23/2022 1405   MCHC 34.2  02/06/2023 1547   MCHC 32.4 08/23/2022 1405   RDW 13.3 02/06/2023 1547   LYMPHSABS 4.8 (H) 02/06/2023 1547   MONOABS 0.5 08/23/2022 1405   EOSABS 0.1 02/06/2023 1547   BASOSABS 0.1 02/06/2023 1547    Hgb A1C Lab Results  Component Value Date   HGBA1C 5.9 (H) 03/18/2022       Assessment and Plan:  COVID-19, URI:  Advised her it is not uncommon to develop a secondary bacterial infection after a viral infection Encouraged rest and fluids Okay to continue Tylenol OTC for fever or bodyaches Rx for azithromycin 250 mg x 5 days She declines RX for pred taper at this time Continue Robitussin for cough Update [place] with new or worsening symptoms  Follow-up with your PCP as previously scheduled  Follow Up Instructions:    I discussed the assessment and treatment plan with the patient. The patient was provided an opportunity to ask questions and all were answered. The patient agreed with the plan and demonstrated an understanding of the instructions.   The patient was advised to call back or seek an in-person evaluation if the symptoms worsen or if the condition fails to improve as anticipated.    Nicki Reaper, NP

## 2023-07-31 ENCOUNTER — Encounter: Payer: Self-pay | Admitting: Allergy

## 2023-07-31 ENCOUNTER — Ambulatory Visit: Payer: BC Managed Care – PPO

## 2023-07-31 ENCOUNTER — Ambulatory Visit: Payer: BC Managed Care – PPO | Admitting: Allergy

## 2023-07-31 ENCOUNTER — Other Ambulatory Visit: Payer: Self-pay

## 2023-07-31 VITALS — BP 108/68 | HR 83 | Temp 98.0°F | Resp 16 | Ht 64.0 in | Wt 164.9 lb

## 2023-07-31 DIAGNOSIS — L501 Idiopathic urticaria: Secondary | ICD-10-CM | POA: Diagnosis not present

## 2023-07-31 DIAGNOSIS — L299 Pruritus, unspecified: Secondary | ICD-10-CM | POA: Diagnosis not present

## 2023-07-31 DIAGNOSIS — H9201 Otalgia, right ear: Secondary | ICD-10-CM

## 2023-07-31 DIAGNOSIS — B999 Unspecified infectious disease: Secondary | ICD-10-CM

## 2023-07-31 NOTE — Patient Instructions (Addendum)
-   continue Xolair monthly injections.   Will have Tammy call you to change to autoinjector for at home administration - you can still use benadryl as needed for breakthrough symptoms  - if having symptoms more through the day then would use Allegra/Pepcid combination  - recommend getting your pneumonia vaccine (pneumovax preferred - We have in stock.  Come back next week to receive).   After that will monitor your frequency of infections.  In 4-6 weeks after vaccination will recheck your titers  - will place ENT referral for recurrent chronic ear pain, fluid in ear  Follow-up in about 4-6 months or sooner if needed Tammy can be reached at [phone]

## 2023-07-31 NOTE — Progress Notes (Signed)
Follow-up Note  RE: Amy Hutchinson MRN: [id number] DOB: [birth year] Date of Office Visit: 07/31/2023   History of present illness: Amy Hutchinson is a 62 y.o. female presenting today for follow-up of pruritus, urticaria and recurrent respiratory infections.  She presents today with her husband.  She was last seen in the office on 04/04/2023 by myself. She has continued on Xolair once a month injections and she is doing well with this.  She states she continues to be nowhere near itchy as she used to be.  She may have occasional breakthrough itch that she may use Benadryl for but this is becoming quite frequent.  She has been able to wean off of all her medicines we were using including Allegra, famotidine and Singulair.  She would like to do Xolair at home administration.  She states her right side face was swollen and painful.  She states the family had had covid recently and had recovered prior to the face swelling and pain.   She just rested at home and COVID illness self-resolved.  She spoke with her PCP about the facial pain and swelling and they advise she go to the ED where she was diagnosed with sinusitis and was prescribed augmentin and prednisone and states didn't really help.  Husband states the swelling has subsided a bit. She states she always has some degree of ear pain, ear pressure or fullness of that site.  She states in the past when she was supposed to have T&A  she was also to have myringotomy tubes placed however she states from an insurance standpoint they only cover the T&A that she never got the ear tubes placed.  She states this is an ongoing issue with the ear pain mostly of the right ear.  Review of systems: 10pt ROS negative unless noted above in HPI  Past medical/social/surgical/family history have been reviewed and are unchanged unless specifically indicated below.  No changes  Medication List: Current Outpatient Medications  Medication Sig Dispense Refill    alprazolam (XANAX) 2 MG tablet Take 0.5 tablets (1 mg total) by mouth 4 (four) times daily as needed for sleep or anxiety. 60 tablet 0   citalopram (CELEXA) 40 MG tablet Take 1 tablet (40 mg total) by mouth daily. 30 tablet    EPINEPHrine 0.3 mg/0.3 mL IJ SOAJ injection Inject 0.3 mg into the muscle as needed for anaphylaxis. 1 each 2   famotidine (PEPCID) 20 MG tablet Take 1 tablet (20 mg total) by mouth 2 (two) times daily. 60 tablet 3   fexofenadine (ALLEGRA) 180 MG tablet Take 180 mg by mouth daily.     QUEtiapine (SEROQUEL) 100 MG tablet Take 1 tablet (100 mg total) by mouth at bedtime. 30 tablet 2   rosuvastatin (CRESTOR) 10 MG tablet Take 1 tablet (10 mg total) by mouth at bedtime. 90 tablet 3   albuterol (VENTOLIN HFA) 108 (90 Base) MCG/ACT inhaler Inhale 1-2 puffs into the lungs every 6 (six) hours as needed for wheezing or shortness of breath. (Patient not taking: Reported on 07/31/2023) 8 g 0   azithromycin (ZITHROMAX) 250 MG tablet Take 2 tabs today, then 1 tab daily x 4 days (Patient not taking: Reported on 07/31/2023) 6 tablet 0   EPITOL 200 MG tablet Take by mouth. (Patient not taking: Reported on 02/06/2023)     famotidine (PEPCID) 20 MG tablet Take 1 tablet (20 mg total) by mouth 2 (two) times daily. 60 tablet 5   fluticasone (FLONASE) 50 MCG/ACT nasal  spray Place 2 sprays into both nostrils daily. Use for 4-6 weeks then stop and use seasonally or as needed. (Patient not taking: Reported on 04/14/2023) 16 g 3   hydrOXYzine (ATARAX) 50 MG tablet Take 1 tablet (50 mg total) by mouth 3 (three) times daily as needed. (Patient not taking: Reported on 05/01/2023) 90 tablet 3   meclizine (ANTIVERT) 25 MG tablet Take 1 tablet (25 mg total) by mouth 3 (three) times daily as needed for dizziness. (Patient not taking: Reported on 05/01/2023) 90 tablet 2   montelukast (SINGULAIR) 10 MG tablet Take 1 tablet (10 mg total) by mouth at bedtime. 30 tablet 5   montelukast (SINGULAIR) 10 MG tablet Take 1  tablet (10 mg total) by mouth at bedtime. (Patient not taking: Reported on 07/31/2023) 30 tablet 5   Current Facility-Administered Medications  Medication Dose Route Frequency Provider Last Rate Last Admin   omalizumab Geoffry Paradise) prefilled syringe 300 mg  300 mg Subcutaneous Q28 days Marcelyn Bruins, MD   300 mg at 07/31/23 1511     Known medication allergies: Allergies  Allergen Reactions   Codeine Nausea And Vomiting   Darvon [Propoxyphene] Nausea And Vomiting   Dilaudid [Hydromorphone] Nausea And Vomiting   Hydrocodone Nausea And Vomiting   Percocet [Oxycodone-Acetaminophen] Nausea And Vomiting     Physical examination: Blood pressure 108/68, pulse 83, temperature 98 F (36.7 C), temperature source Temporal, resp. rate 16, height 5\' 4"  (1.626 m), weight 164 lb 14.4 oz (74.8 kg), SpO2 95%.  General: Alert, interactive, in no acute distress. HEENT: PERRLA, right TM dull with slight bulge without erythema with clear fluid, left TMs pearly gray, turbinates non-edematous without discharge, post-pharynx non erythematous. Neck: Supple without lymphadenopathy. Lungs: Clear to auscultation without wheezing, rhonchi or rales. {no increased work of breathing. CV: Normal S1, S2 without murmurs. Abdomen: Nondistended, nontender. Skin: Warm and dry, without lesions or rashes. Extremities:  No clubbing, cyanosis or edema. Neuro:   Grossly intact.  Diagnositics/Labs: Xolair injections given today 150 mg in each arm for 300 mg total  Assessment and plan: Pruritus History of urticaria Recurrent respiratory tract infections Ear pain   - continue Xolair monthly injections.   Will have Tammy call you to change to autoinjector for at home administration - you can still use benadryl as needed for breakthrough symptoms  - if having symptoms more through the day then would use Allegra/Pepcid combination  - recommend getting your pneumonia vaccine (pneumovax preferred - We have in stock.   Come back next week to receive).   After that will monitor your frequency of infections.  In 4-6 weeks after vaccination will recheck your titers  - will place ENT referral for recurrent chronic ear pain, fluid in ear  Follow-up in about 4-6 months or sooner if needed Tammy can be reached at [phone]   I appreciate the opportunity to take part in Amy Hutchinson's care. Please do not hesitate to contact me with questions.  Sincerely,   Margo Aye, MD Allergy/Immunology [facility]

## 2023-08-01 ENCOUNTER — Telehealth: Payer: Self-pay | Admitting: *Deleted

## 2023-08-01 ENCOUNTER — Other Ambulatory Visit: Payer: Self-pay

## 2023-08-01 NOTE — Telephone Encounter (Signed)
L/m for patient to contact me regarding at home admin for Xolair

## 2023-08-01 NOTE — Telephone Encounter (Signed)
Patient given instructions on delivery, storage,ordering and dosing for Xolair at home

## 2023-08-01 NOTE — Telephone Encounter (Signed)
-----   Message from [facility] Padgett sent at 07/31/2023  5:51 PM EDT ----- Patient would like to do at home administration.  The may have forgot to let you know after her last visit that she wanted to do this.  Can you give her a call regarding change to autoinjector

## 2023-08-06 ENCOUNTER — Ambulatory Visit: Payer: BC Managed Care – PPO

## 2023-08-06 ENCOUNTER — Telehealth: Payer: Self-pay | Admitting: Family Medicine

## 2023-08-06 DIAGNOSIS — J329 Chronic sinusitis, unspecified: Secondary | ICD-10-CM

## 2023-08-06 DIAGNOSIS — H9201 Otalgia, right ear: Secondary | ICD-10-CM

## 2023-08-06 NOTE — Telephone Encounter (Signed)
Pt is calling in because she was seen recently and was prescribed Amoxicillin. Pt says the medication did not work and she wants to know what Dr. Kirtland Bouchard thinks she should do. She wants to know does he want to see her or can he prescribe her something else for her ears? Pt says she has a headache and a sore throat and she believes it's stemming from the issue she had with her ears. Please follow up with pt.

## 2023-08-06 NOTE — Telephone Encounter (Signed)
I have reviewed her chart  Last visit with me 12/2022. She has seen Nicki Reaper, FNP here in the interval for other similar issues.  The recent visit was [facility] ED visit on 07/10/23, which was about 1 month ago.  It sounds like an ongoing problem, not a short term issue. I am not convinced more and different antibiotics will solve her problem.  There is not a lot I can do or recommend based on her phone call today without a visit.  However, my advice is the following:  I looks like she has been evaluated by Allergist for sinuses/allergies, and is on a variety of therapy.  I don't see any recent visit back to the ENT specialist who helps with chronic sinus and ear issues. Her last visit to them was 04/2022 for Dizziness, which was related to a separate issue.  I would suggest we can submit a referral to return to [facility] for a consult on her sinuses / ear next.  Let me know and I can place the referral.  Amy Pilar, DO [facility] 08/06/2023, 5:00 PM

## 2023-08-07 NOTE — Telephone Encounter (Signed)
Referral sent to [facility] to return to Dr Willeen Cass  Saralyn Pilar, DO [facility] 08/07/2023, 11:24 AM

## 2023-08-07 NOTE — Addendum Note (Signed)
Addended by: Smitty Cords on: 08/07/2023 11:24 AM   Modules accepted: Orders

## 2023-08-07 NOTE — Telephone Encounter (Signed)
I spoke with patient and relayed message from Dr. Kirtland Bouchard. She agrees to see ENT. Please place referral.  She is concerned that they will require a CT Scan and she may not be able to afford it. I encouraged her to see the specialist and find out what their recommendations could be - they may or may not require imaging.

## 2023-08-08 ENCOUNTER — Telehealth: Payer: Self-pay

## 2023-08-08 NOTE — Telephone Encounter (Signed)
Patients PCP placed a referral to [facility] on 08/07/2023.

## 2023-08-08 NOTE — Telephone Encounter (Signed)
-----   Message from [facility] Padgett sent at 07/31/2023  5:51 PM EDT ----- Please place ENT referral for chronic right ear pain with recurrent sinusitis

## 2023-08-18 ENCOUNTER — Ambulatory Visit: Payer: BC Managed Care – PPO

## 2023-08-22 DIAGNOSIS — H903 Sensorineural hearing loss, bilateral: Secondary | ICD-10-CM | POA: Diagnosis not present

## 2023-08-22 DIAGNOSIS — J310 Chronic rhinitis: Secondary | ICD-10-CM | POA: Diagnosis not present

## 2023-08-22 DIAGNOSIS — M26623 Arthralgia of bilateral temporomandibular joint: Secondary | ICD-10-CM | POA: Diagnosis not present

## 2023-08-22 DIAGNOSIS — H6983 Other specified disorders of Eustachian tube, bilateral: Secondary | ICD-10-CM | POA: Diagnosis not present

## 2023-08-27 DIAGNOSIS — F411 Generalized anxiety disorder: Secondary | ICD-10-CM | POA: Diagnosis not present

## 2023-08-27 DIAGNOSIS — F3340 Major depressive disorder, recurrent, in remission, unspecified: Secondary | ICD-10-CM | POA: Diagnosis not present

## 2023-08-27 DIAGNOSIS — F410 Panic disorder [episodic paroxysmal anxiety] without agoraphobia: Secondary | ICD-10-CM | POA: Diagnosis not present

## 2023-08-28 ENCOUNTER — Ambulatory Visit: Payer: BC Managed Care – PPO

## 2023-08-28 DIAGNOSIS — L501 Idiopathic urticaria: Secondary | ICD-10-CM

## 2023-08-28 NOTE — Progress Notes (Signed)
Immunotherapy   Patient Details  Name: Amy Hutchinson MRN: [id number] Date of Birth: Aug 19, 1961  08/28/2023  Amy Hutchinson started injections for  Xolair home administration.  Following schedule: Every twenty eight days.  Frequency: Every four weeks.  Epi-Pen:Epi-Pen Available  Consent signed previously and patient instructions given on how to administer injections into the thigh and abdomen. Patient injected one Xolair syringe successfully into her right thigh. Patient and her husband sat in room twenty seven for about twenty minutes without an issue before leaving.    Ralene Muskrat 08/28/2023, 3:35 PM

## 2023-08-29 ENCOUNTER — Ambulatory Visit: Payer: BC Managed Care – PPO

## 2023-09-01 ENCOUNTER — Ambulatory Visit: Payer: BC Managed Care – PPO

## 2023-09-03 DIAGNOSIS — H04123 Dry eye syndrome of bilateral lacrimal glands: Secondary | ICD-10-CM | POA: Diagnosis not present

## 2023-09-03 DIAGNOSIS — H5203 Hypermetropia, bilateral: Secondary | ICD-10-CM | POA: Diagnosis not present

## 2023-09-03 DIAGNOSIS — H35371 Puckering of macula, right eye: Secondary | ICD-10-CM | POA: Diagnosis not present

## 2023-09-03 DIAGNOSIS — H2513 Age-related nuclear cataract, bilateral: Secondary | ICD-10-CM | POA: Diagnosis not present

## 2023-09-25 ENCOUNTER — Ambulatory Visit: Payer: BC Managed Care – PPO

## 2023-09-25 DIAGNOSIS — L501 Idiopathic urticaria: Secondary | ICD-10-CM

## 2023-10-01 ENCOUNTER — Telehealth: Payer: Self-pay | Admitting: *Deleted

## 2023-10-01 NOTE — Telephone Encounter (Signed)
L/m for patient she can start doing home admin at anytime now she can reach out to [facility] to order same. Given instrux on delivery and storage for same

## 2023-10-01 NOTE — Telephone Encounter (Signed)
-----   Message from Nurse Vonzell Schlatter sent at 09/25/2023  6:07 PM EDT ----- Patient is wondering after her Xolair injection on 10/23/2023 what does she need to do to get them at home?

## 2023-10-08 ENCOUNTER — Encounter: Payer: Self-pay | Admitting: *Deleted

## 2023-10-09 ENCOUNTER — Other Ambulatory Visit: Payer: Self-pay | Admitting: Allergy

## 2023-10-09 MED ORDER — MONTELUKAST SODIUM 10 MG PO TABS: 10.0000 mg | ORAL_TABLET | Freq: Every day | ORAL | 4 refills | Status: DC

## 2023-10-09 MED ORDER — FEXOFENADINE HCL 180 MG PO TABS: 180.0000 mg | ORAL_TABLET | Freq: Every day | ORAL | 4 refills | Status: DC

## 2023-10-09 MED ORDER — FAMOTIDINE 20 MG PO TABS: 20.0000 mg | ORAL_TABLET | Freq: Two times a day (BID) | ORAL | 4 refills | Status: DC

## 2023-10-23 ENCOUNTER — Ambulatory Visit: Payer: BC Managed Care – PPO

## 2023-11-21 DIAGNOSIS — F410 Panic disorder [episodic paroxysmal anxiety] without agoraphobia: Secondary | ICD-10-CM | POA: Diagnosis not present

## 2023-11-21 DIAGNOSIS — F411 Generalized anxiety disorder: Secondary | ICD-10-CM | POA: Diagnosis not present

## 2023-11-21 DIAGNOSIS — F3340 Major depressive disorder, recurrent, in remission, unspecified: Secondary | ICD-10-CM | POA: Diagnosis not present

## 2023-12-09 ENCOUNTER — Telehealth: Payer: BC Managed Care – PPO | Admitting: Nurse Practitioner

## 2023-12-09 ENCOUNTER — Ambulatory Visit: Payer: Self-pay

## 2023-12-09 DIAGNOSIS — R112 Nausea with vomiting, unspecified: Secondary | ICD-10-CM | POA: Diagnosis not present

## 2023-12-09 DIAGNOSIS — R197 Diarrhea, unspecified: Secondary | ICD-10-CM | POA: Diagnosis not present

## 2023-12-09 MED ORDER — ONDANSETRON 4 MG PO TBDP: 4.0000 mg | ORAL_TABLET | Freq: Three times a day (TID) | ORAL | 0 refills | Status: DC | PRN

## 2023-12-09 NOTE — Telephone Encounter (Signed)
  Chief Complaint: N/V/D Symptoms: earache to both ears/diarrhea, nasal congestion, headache, jaw swelling on both sides Frequency: since Christmas Eve Pertinent Negatives: Patient denies dry mouth,  Disposition: [] ED /[] Urgent Care (no appt availability in office) / [] Appointment(In office/virtual)/ [x]  [facility]/ [] Home Care/ [] Refused Recommended Disposition /[] [facility]/ []  Follow-up with PCP Additional Notes: no appts Reason for Disposition  Nausea is a chronic symptom (recurrent or ongoing AND present > 4 weeks)  Answer Assessment - Initial Assessment Questions 1. NAUSEA SEVERITY: How bad is the nausea? (e.g., mild, moderate, severe; dehydration, weight loss)   - MILD: loss of appetite without change in eating habits   - MODERATE: decreased oral intake without significant weight loss, dehydration, or malnutrition   - SEVERE: inadequate caloric or fluid intake, significant weight loss, symptoms of dehydration     mild 2. ONSET: When did the nausea begin?     Christmas Evening  3. VOMITING: Any vomiting? If Yes, ask: How many times today?     2 days ago  4. RECURRENT SYMPTOM: Have you had nausea before? If Yes, ask: When was the last time? What happened that time?     Diarrhea. Earache, headache, nasal congestion, jaw swelling  mid ear down ear both ears  Protocols used: Nausea-A-AH

## 2023-12-09 NOTE — Progress Notes (Signed)
 Virtual Visit Consent   Amy Hutchinson, you are scheduled for a virtual visit with a [facility] provider today. Just as with appointments in the office, your consent must be obtained to participate. Your consent will be active for this visit and any virtual visit you may have with one of our providers in the next 365 days. If you have a MyChart account, a copy of this consent can be sent to you electronically.  As this is a virtual visit, video technology does not allow for your provider to perform a traditional examination. This may limit your provider's ability to fully assess your condition. If your provider identifies any concerns that need to be evaluated in person or the need to arrange testing (such as labs, EKG, etc.), we will make arrangements to do so. Although advances in technology are sophisticated, we cannot ensure that it will always work on either your end or our end. If the connection with a video visit is poor, the visit may have to be switched to a telephone visit. With either a video or telephone visit, we are not always able to ensure that we have a secure connection.  By engaging in this virtual visit, you consent to the provision of healthcare and authorize for your insurance to be billed (if applicable) for the services provided during this visit. Depending on your insurance coverage, you may receive a charge related to this service.  I need to obtain your verbal consent now. Are you willing to proceed with your visit today? Phoebe Marter has provided verbal consent on 12/09/2023 for a virtual visit (video or telephone). Lauraine Kitty, FNP  Date: 12/09/2023 12:47 PM  Virtual Visit via Video Note   I, Lauraine Kitty, connected with  Amy Hutchinson  ([id number], 05-16-1961) on 12/09/23 at  1:45 PM EST by a video-enabled telemedicine application and verified that I am speaking with the correct person using two identifiers.  Location: Patient: Virtual Visit Location Patient: Home Provider:  Virtual Visit Location Provider: Home Office   I discussed the limitations of evaluation and management by telemedicine and the availability of in person appointments. The patient expressed understanding and agreed to proceed.    History of Present Illness: Amy Hutchinson is a 63 y.o. 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   Pre-diabetes 03/06/2021   Pruritus 11/23/2020   Psychophysiological insomnia 06/27/2020   GAD (generalized anxiety disorder) 06/27/2020   Screening for colon cancer 06/27/2020   Major depressive disorder, recurrent, in remission (HCC) 06/27/2020   Mixed hyperlipidemia 06/27/2020   History of resection of meningioma 06/27/2020   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 (Patient not taking: Reported on 07/31/2023), Disp: 8 g, Rfl: 0   alprazolam  (XANAX ) 2 MG tablet, Take 0.5 tablets (1 mg total) by mouth 4 (four) times daily as needed for sleep or anxiety., Disp: 60  tablet, Rfl: 0   azithromycin  (ZITHROMAX ) 250 MG tablet, Take 2 tabs today, then 1 tab daily x 4 days (Patient not taking: Reported on 07/31/2023), Disp: 6 tablet, Rfl: 0   citalopram  (CELEXA ) 40 MG tablet, Take 1 tablet (40 mg total) by mouth daily., Disp: 30 tablet, Rfl:    EPINEPHrine  0.3 mg/0.3 mL IJ SOAJ injection, Inject 0.3 mg into the muscle as needed for anaphylaxis., Disp: 1 each, Rfl: 2   EPITOL 200 MG tablet, Take by mouth. (Patient not taking: Reported on 02/06/2023), Disp: , Rfl:    famotidine  (PEPCID ) 20 MG tablet, Take 1 tablet (20 mg total) by mouth 2 (two) times daily., Disp: 60 tablet, Rfl: 4   fexofenadine  (EQ ALLERGY RELIEF) 180 MG tablet, Take 1 tablet (180 mg total) by mouth daily., Disp: 90 tablet, Rfl: 1   fluticasone  (FLONASE ) 50 MCG/ACT nasal spray, Place 2 sprays into both nostrils daily. Use for 4-6 weeks then stop and use seasonally or as needed. (Patient not taking: Reported on 04/14/2023), Disp: 16 g, Rfl: 3   hydrOXYzine  (ATARAX ) 50 MG tablet, Take 1 tablet (50 mg total) by mouth 3 (three) times daily as needed. (Patient not taking: Reported on 05/01/2023), Disp: 90 tablet, Rfl: 3   meclizine  (ANTIVERT ) 25 MG tablet, Take 1 tablet (25 mg total) by mouth 3 (three) times daily as needed for dizziness. (Patient not taking: Reported on 05/01/2023), Disp: 90 tablet, Rfl: 2   montelukast  (SINGULAIR ) 10 MG tablet, Take 1 tablet (10 mg total) by mouth at bedtime. (Patient not taking: Reported on 07/31/2023), Disp: 30 tablet, Rfl: 5   montelukast  (SINGULAIR ) 10 MG tablet, Take 1 tablet (10 mg total) by mouth at bedtime., Disp: 30 tablet, Rfl: 4   QUEtiapine  (SEROQUEL ) 100 MG tablet, Take 1 tablet (100 mg total) by mouth at bedtime., Disp: 30 tablet, Rfl: 2   rosuvastatin  (CRESTOR ) 10 MG tablet, Take 1 tablet (10 mg total) by mouth at bedtime., Disp: 90 tablet, Rfl: 3  Current Facility-Administered Medications:    omalizumab  (XOLAIR ) prefilled syringe 300 mg, 300 mg, Subcutaneous, Q28  days, Jeneal Danita Macintosh, MD, 300 mg at 09/25/23 1523  Observations/Objective: Patient is well-developed, well-nourished in no acute distress.  Resting comfortably  at home.  Head is normocephalic, atraumatic.  No labored breathing.  Speech is clear and coherent with logical content.  Patient is alert and oriented at baseline.    Assessment and Plan:   1. Diarrhea, unspecified type (Primary)  Discussed BRAT diet with patient  Pushing fluids & monitoring hydration   2. Nausea and vomiting, unspecified vomiting type  Discussed red flag symptoms that would warrant urgent in person evaluation  Advised patient to be seen in person by PCP within the week to discuss ongoing symptoms and for testing as needed   - ondansetron  (ZOFRAN -ODT) 4 MG disintegrating tablet; Take 1 tablet (4 mg total) by mouth every 8 (eight) hours as needed.  Dispense: 20 tablet; Refill: 0     Follow Up Instructions: I discussed the assessment and treatment plan with the patient. The patient was provided  an opportunity to ask questions and all were answered. The patient agreed with the plan and demonstrated an understanding of the instructions.  A copy of instructions were sent to the patient via MyChart unless otherwise noted below.    The patient was advised to call back or seek an in-person evaluation if the symptoms worsen or if the condition fails to improve as anticipated.    Lauraine Kitty, FNP

## 2023-12-10 NOTE — Telephone Encounter (Signed)
 Pt calling back because she did [facility] appt yesterday, Sarah, NP advised pt based on sx she needed to be seen in person. No appts available until 12/15/23. Offered UC appt for pt. She refused and prefers to be seen in office. Scheduled appt for 12/15/23 at 1500. Added appt to waitlist. Recommended if sx get worse to go to UC or ED.

## 2023-12-15 ENCOUNTER — Ambulatory Visit: Payer: BC Managed Care – PPO | Admitting: Internal Medicine

## 2023-12-15 NOTE — Progress Notes (Deleted)
 Subjective:    Patient ID: Amy Hutchinson, female    DOB: 06/01/1961, 63 y.o.   MRN: [id number]  HPI    Review of Systems   Past Medical History:  Diagnosis Date   Anxiety    Back pain    Depression    Hyperlipidemia    Recurrent upper respiratory infection (URI)     Current Outpatient Medications  Medication Sig Dispense Refill   albuterol  (VENTOLIN  HFA) 108 (90 Base) MCG/ACT inhaler Inhale 1-2 puffs into the lungs every 6 (six) hours as needed for wheezing or shortness of breath. (Patient not taking: Reported on 07/31/2023) 8 g 0   alprazolam  (XANAX ) 2 MG tablet Take 0.5 tablets (1 mg total) by mouth 4 (four) times daily as needed for sleep or anxiety. 60 tablet 0   azithromycin  (ZITHROMAX ) 250 MG tablet Take 2 tabs today, then 1 tab daily x 4 days (Patient not taking: Reported on 07/31/2023) 6 tablet 0   citalopram  (CELEXA ) 40 MG tablet Take 1 tablet (40 mg total) by mouth daily. 30 tablet    EPINEPHrine  0.3 mg/0.3 mL IJ SOAJ injection Inject 0.3 mg into the muscle as needed for anaphylaxis. 1 each 2   EPITOL 200 MG tablet Take by mouth. (Patient not taking: Reported on 02/06/2023)     famotidine  (PEPCID ) 20 MG tablet Take 1 tablet (20 mg total) by mouth 2 (two) times daily. 60 tablet 4   fexofenadine  (EQ ALLERGY RELIEF) 180 MG tablet Take 1 tablet (180 mg total) by mouth daily. 90 tablet 1   fluticasone  (FLONASE ) 50 MCG/ACT nasal spray Place 2 sprays into both nostrils daily. Use for 4-6 weeks then stop and use seasonally or as needed. (Patient not taking: Reported on 04/14/2023) 16 g 3   hydrOXYzine  (ATARAX ) 50 MG tablet Take 1 tablet (50 mg total) by mouth 3 (three) times daily as needed. (Patient not taking: Reported on 05/01/2023) 90 tablet 3   meclizine  (ANTIVERT ) 25 MG tablet Take 1 tablet (25 mg total) by mouth 3 (three) times daily as needed for dizziness. (Patient not taking: Reported on 05/01/2023) 90 tablet 2   montelukast  (SINGULAIR ) 10 MG tablet Take 1 tablet (10 mg total)  by mouth at bedtime. (Patient not taking: Reported on 07/31/2023) 30 tablet 5   montelukast  (SINGULAIR ) 10 MG tablet Take 1 tablet (10 mg total) by mouth at bedtime. 30 tablet 4   ondansetron  (ZOFRAN -ODT) 4 MG disintegrating tablet Take 1 tablet (4 mg total) by mouth every 8 (eight) hours as needed. 20 tablet 0   QUEtiapine  (SEROQUEL ) 100 MG tablet Take 1 tablet (100 mg total) by mouth at bedtime. 30 tablet 2   rosuvastatin  (CRESTOR ) 10 MG tablet Take 1 tablet (10 mg total) by mouth at bedtime. 90 tablet 3   Current Facility-Administered Medications  Medication Dose Route Frequency Provider Last Rate Last Admin   omalizumab  (XOLAIR ) prefilled syringe 300 mg  300 mg Subcutaneous Q28 days Jeneal Danita Macintosh, MD   300 mg at 09/25/23 1523    Allergies  Allergen Reactions   Codeine Nausea And Vomiting   Darvon [Propoxyphene] Nausea And Vomiting   Dilaudid [Hydromorphone] Nausea And Vomiting   Hydrocodone Nausea And Vomiting   Percocet [Oxycodone-Acetaminophen ] Nausea And Vomiting    Family History  Problem Relation Age of Onset   Diabetes Mother    Bipolar disorder Mother    Anxiety disorder Mother    Depression Mother    Lung cancer Mother  lung   Anxiety disorder Sister    Depression Sister    Anxiety disorder Sister    Colon cancer Neg Hx    Breast cancer Neg Hx     Social History   Socioeconomic History   Marital status: Married    Spouse name: Not on file   Number of children: Not on file   Years of education: High School   Highest education level: High school graduate  Occupational History   Not on file  Tobacco Use   Smoking status: Every Day    Current packs/day: 0.40    Average packs/day: 0.4 packs/day for 32.0 years (12.8 ttl pk-yrs)    Types: Cigarettes   Smokeless tobacco: Never  Vaping Use   Vaping status: Never Used  Substance and Sexual Activity   Alcohol use: Not Currently   Drug use: Never   Sexual activity: Not on file  Other Topics  Concern   Not on file  Social History Narrative   Not on file   Social Drivers of Health   Financial Resource Strain: Not on file  Food Insecurity: Not on file  Transportation Needs: Not on file  Physical Activity: Not on file  Stress: Not on file  Social Connections: Unknown (11/30/2022)   Received from [facility], [facility]   Social Network    Social Network: Not on file  Intimate Partner Violence: Unknown (11/30/2022)   Received from [employer], [facility]   HITS    Physically Hurt: Not on file    Insult or Talk Down To: Not on file    Threaten Physical Harm: Not on file    Scream or Curse: Not on file     Constitutional: Denies fever, malaise, fatigue, headache or abrupt weight changes.  HEENT: Denies eye pain, eye redness, ear pain, ringing in the ears, wax buildup, runny nose, nasal congestion, bloody nose, or sore throat. Respiratory: Denies difficulty breathing, shortness of breath, cough or sputum production.   Cardiovascular: Denies chest pain, chest tightness, palpitations or swelling in the hands or feet.  Gastrointestinal: Pt reports abdominal pain, nausea. Denies bloating, constipation, diarrhea or blood in the stool.  GU: Denies urgency, frequency, pain with urination, burning sensation, blood in urine, odor or discharge. Musculoskeletal: Denies decrease in range of motion, difficulty with gait, muscle pain or joint pain and swelling.  Skin: Denies redness, rashes, lesions or ulcercations.  Neurological: Denies dizziness, difficulty with memory, difficulty with speech or problems with balance and coordination.  Psych: Denies anxiety, depression, SI/HI.  No other specific complaints in a complete review of systems (except as listed in HPI above).      Objective:   Physical Exam  There were no vitals taken for this visit. Wt Readings from Last 3 Encounters:  07/31/23 164 lb 14.4 oz (74.8 kg)  07/10/23 162 lb (73.5 kg)  05/01/23 158 lb (71.7  kg)    General: Appears their stated age, well developed, well nourished in NAD. Skin: Warm, dry and intact. No rashes, lesions or ulcerations noted. HEENT: Head: normal shape and size; Eyes: sclera white, no icterus, conjunctiva pink, PERRLA and EOMs intact; Ears: Tm's gray and intact, normal light reflex; Nose: mucosa pink and moist, septum midline; Throat/Mouth: Teeth present, mucosa pink and moist, no exudate, lesions or ulcerations noted.  Neck:  Neck supple, trachea midline. No masses, lumps or thyromegaly present.  Cardiovascular: Normal rate and rhythm. S1,S2 noted.  No murmur, rubs or gallops noted. No JVD or BLE edema.  No carotid bruits noted. Pulmonary/Chest: Normal effort and positive vesicular breath sounds. No respiratory distress. No wheezes, rales or ronchi noted.  Abdomen: Soft and nontender. Normal bowel sounds. No distention or masses noted. Liver, spleen and kidneys non palpable. Musculoskeletal: Normal range of motion. No signs of joint swelling. No difficulty with gait.  Neurological: Alert and oriented. Cranial nerves II-XII grossly intact. Coordination normal.  Psychiatric: Mood and affect normal. Behavior is normal. Judgment and thought content normal.    BMET    Component Value Date/Time   NA 140 07/10/2023 1609   NA 141 02/06/2023 1547   K 3.8 07/10/2023 1609   CL 107 07/10/2023 1609   CO2 23 07/10/2023 1609   GLUCOSE 140 (H) 07/10/2023 1609   BUN 14 07/10/2023 1609   BUN 13 02/06/2023 1547   CREATININE 0.71 07/10/2023 1609   CREATININE 0.83 03/18/2022 0859   CALCIUM  9.4 07/10/2023 1609   GFRNONAA >60 07/10/2023 1609   GFRNONAA 80 03/05/2021 0802   GFRAA 93 03/05/2021 0802    Lipid Panel     Component Value Date/Time   CHOL 174 03/18/2022 0859   TRIG 181 (H) 03/18/2022 0859   HDL 40 (L) 03/18/2022 0859   CHOLHDL 4.4 03/18/2022 0859   LDLCALC 104 (H) 03/18/2022 0859    CBC    Component Value Date/Time   WBC 12.7 (H) 07/10/2023 1609   RBC 4.91  07/10/2023 1609   HGB 14.5 07/10/2023 1609   HGB 14.7 02/06/2023 1547   HCT 44.3 07/10/2023 1609   HCT 43.0 02/06/2023 1547   PLT 346 07/10/2023 1609   MCV 90.2 07/10/2023 1609   MCV 89 02/06/2023 1547   MCH 29.5 07/10/2023 1609   MCHC 32.7 07/10/2023 1609   RDW 13.9 07/10/2023 1609   RDW 13.3 02/06/2023 1547   LYMPHSABS 4.8 (H) 02/06/2023 1547   MONOABS 0.5 08/23/2022 1405   EOSABS 0.1 02/06/2023 1547   BASOSABS 0.1 02/06/2023 1547    Hgb A1C Lab Results  Component Value Date   HGBA1C 5.9 (H) 03/18/2022            Assessment & Plan:    Followup with your PCP as previously scheduled Angeline Laura, NP

## 2023-12-19 ENCOUNTER — Ambulatory Visit: Payer: BC Managed Care – PPO | Admitting: Family Medicine

## 2023-12-19 ENCOUNTER — Observation Stay: Payer: BC Managed Care – PPO

## 2023-12-19 ENCOUNTER — Emergency Department: Payer: BC Managed Care – PPO

## 2023-12-19 ENCOUNTER — Other Ambulatory Visit: Payer: Self-pay

## 2023-12-19 ENCOUNTER — Observation Stay
Admission: EM | Admit: 2023-12-19 | Discharge: 2023-12-20 | Disposition: A | Discharge status: Home / Self Care | Payer: BC Managed Care – PPO | Attending: Student | Admitting: Student

## 2023-12-19 ENCOUNTER — Ambulatory Visit: Payer: Self-pay | Admitting: *Deleted

## 2023-12-19 DIAGNOSIS — E663 Overweight: Secondary | ICD-10-CM | POA: Insufficient documentation

## 2023-12-19 DIAGNOSIS — Z79899 Other long term (current) drug therapy: Secondary | ICD-10-CM | POA: Insufficient documentation

## 2023-12-19 DIAGNOSIS — F419 Anxiety disorder, unspecified: Secondary | ICD-10-CM | POA: Insufficient documentation

## 2023-12-19 DIAGNOSIS — F418 Other specified anxiety disorders: Secondary | ICD-10-CM | POA: Diagnosis present

## 2023-12-19 DIAGNOSIS — E785 Hyperlipidemia, unspecified: Secondary | ICD-10-CM | POA: Diagnosis present

## 2023-12-19 DIAGNOSIS — R42 Dizziness and giddiness: Secondary | ICD-10-CM

## 2023-12-19 DIAGNOSIS — R569 Unspecified convulsions: Secondary | ICD-10-CM | POA: Diagnosis not present

## 2023-12-19 DIAGNOSIS — F17210 Nicotine dependence, cigarettes, uncomplicated: Secondary | ICD-10-CM | POA: Insufficient documentation

## 2023-12-19 DIAGNOSIS — I6522 Occlusion and stenosis of left carotid artery: Secondary | ICD-10-CM | POA: Diagnosis not present

## 2023-12-19 DIAGNOSIS — R55 Syncope and collapse: Secondary | ICD-10-CM | POA: Diagnosis not present

## 2023-12-19 DIAGNOSIS — J4530 Mild persistent asthma, uncomplicated: Secondary | ICD-10-CM | POA: Diagnosis not present

## 2023-12-19 DIAGNOSIS — F32A Depression, unspecified: Secondary | ICD-10-CM | POA: Insufficient documentation

## 2023-12-19 DIAGNOSIS — Z6829 Body mass index (BMI) 29.0-29.9, adult: Secondary | ICD-10-CM | POA: Insufficient documentation

## 2023-12-19 LAB — URINALYSIS, ROUTINE W REFLEX MICROSCOPIC
Bilirubin Urine: NEGATIVE
Glucose, UA: NEGATIVE mg/dL
Ketones, ur: NEGATIVE mg/dL
Nitrite: NEGATIVE
Protein, ur: NEGATIVE mg/dL
Specific Gravity, Urine: 1.008 (ref 1.005–1.030)
pH: 6 (ref 5.0–8.0)

## 2023-12-19 LAB — COMPREHENSIVE METABOLIC PANEL
ALT: 14 U/L (ref 0–44)
AST: 17 U/L (ref 15–41)
Albumin: 4.1 g/dL (ref 3.5–5.0)
Alkaline Phosphatase: 63 U/L (ref 38–126)
Anion gap: 9 (ref 5–15)
BUN: 15 mg/dL (ref 6–20)
CO2: 22 mmol/L (ref 22–32)
Calcium: 9.3 mg/dL (ref 8.9–10.3)
Chloride: 107 mmol/L (ref 98–111)
Creatinine, Ser: 0.8 mg/dL (ref 0.44–1.00)
GFR, Estimated: >60 mL/min (ref >60)
Glucose, Bld: 110 mg/dL — ABNORMAL HIGH (ref 70–99)
Potassium: 4.2 mmol/L (ref 3.5–5.1)
Sodium: 138 mmol/L (ref 135–145)
Total Bilirubin: 0.6 mg/dL (ref 0.0–1.2)
Total Protein: 6.9 g/dL (ref 6.5–8.1)

## 2023-12-19 LAB — CBC
HCT: 43.4 % (ref 36.0–46.0)
Hemoglobin: 14 g/dL (ref 12.0–15.0)
MCH: 29.5 pg (ref 26.0–34.0)
MCHC: 32.3 g/dL (ref 30.0–36.0)
MCV: 91.6 fL (ref 80.0–100.0)
Platelets: 316 10*3/uL (ref 150–400)
RBC: 4.74 MIL/uL (ref 3.87–5.11)
RDW: 14.1 % (ref 11.5–15.5)
WBC: 10.3 10*3/uL (ref 4.0–10.5)
nRBC: 0 % (ref 0.0–0.2)

## 2023-12-19 LAB — URINE DRUG SCREEN, QUALITATIVE (ARMC ONLY)
Amphetamines, Ur Screen: NOT DETECTED
Barbiturates, Ur Screen: NOT DETECTED
Benzodiazepine, Ur Scrn: POSITIVE — AB
Cannabinoid 50 Ng, Ur ~~LOC~~: NOT DETECTED
Cocaine Metabolite,Ur ~~LOC~~: NOT DETECTED
MDMA (Ecstasy)Ur Screen: NOT DETECTED
Methadone Scn, Ur: NOT DETECTED
Opiate, Ur Screen: NOT DETECTED
Phencyclidine (PCP) Ur S: NOT DETECTED
Tricyclic, Ur Screen: POSITIVE — AB

## 2023-12-19 LAB — CBG MONITORING, ED: Glucose-Capillary: 93 mg/dL (ref 70–99)

## 2023-12-19 LAB — TROPONIN I (HIGH SENSITIVITY)
Troponin I (High Sensitivity): 3 ng/L (ref <18)
Troponin I (High Sensitivity): 2 ng/L (ref <18)

## 2023-12-19 MED ORDER — SODIUM CHLORIDE 0.9 % IV SOLN: INTRAVENOUS | Status: DC

## 2023-12-19 MED ORDER — QUETIAPINE FUMARATE 25 MG PO TABS
100.0000 mg | ORAL_TABLET | Freq: Every day | ORAL | Status: DC
  Administered 2023-12-19: 100 mg via ORAL
  Filled 2023-12-19: qty 4

## 2023-12-19 MED ORDER — MONTELUKAST SODIUM 10 MG PO TABS
10.0000 mg | ORAL_TABLET | Freq: Every day | ORAL | Status: DC
  Administered 2023-12-19: 10 mg via ORAL
  Filled 2023-12-19: qty 1

## 2023-12-19 MED ORDER — LORAZEPAM 2 MG/ML IJ SOLN: 2.0000 mg | INTRAMUSCULAR | Status: DC | PRN

## 2023-12-19 MED ORDER — CITALOPRAM HYDROBROMIDE 20 MG PO TABS
40.0000 mg | ORAL_TABLET | Freq: Every day | ORAL | Status: DC
  Administered 2023-12-19 – 2023-12-20 (×2): 40 mg via ORAL
  Filled 2023-12-19 (×2): qty 2

## 2023-12-19 MED ORDER — NICOTINE 21 MG/24HR TD PT24
21.0000 mg | MEDICATED_PATCH | Freq: Every day | TRANSDERMAL | Status: DC
  Administered 2023-12-19: 21 mg via TRANSDERMAL
  Filled 2023-12-19 (×2): qty 1

## 2023-12-19 MED ORDER — ALBUTEROL SULFATE (2.5 MG/3ML) 0.083% IN NEBU: 3.0000 mL | INHALATION_SOLUTION | RESPIRATORY_TRACT | Status: DC | PRN

## 2023-12-19 MED ORDER — SODIUM CHLORIDE 0.9 % IV BOLUS
1000.0000 mL | Freq: Once | INTRAVENOUS | Status: AC
  Administered 2023-12-19: 1000 mL via INTRAVENOUS

## 2023-12-19 MED ORDER — MECLIZINE HCL 25 MG PO TABS: 25.0000 mg | ORAL_TABLET | Freq: Three times a day (TID) | ORAL | Status: DC | PRN

## 2023-12-19 MED ORDER — DM-GUAIFENESIN ER 30-600 MG PO TB12: 1.0000 | ORAL_TABLET | Freq: Two times a day (BID) | ORAL | Status: DC | PRN

## 2023-12-19 MED ORDER — ALPRAZOLAM 1 MG PO TABS: 1.0000 mg | ORAL_TABLET | Freq: Four times a day (QID) | ORAL | Status: DC | PRN

## 2023-12-19 MED ORDER — ACETAMINOPHEN 325 MG PO TABS: 650.0000 mg | ORAL_TABLET | Freq: Four times a day (QID) | ORAL | Status: DC | PRN

## 2023-12-19 MED ORDER — ONDANSETRON HCL 4 MG/2ML IJ SOLN: 4.0000 mg | Freq: Three times a day (TID) | INTRAMUSCULAR | Status: DC | PRN

## 2023-12-19 MED ORDER — ROSUVASTATIN CALCIUM 10 MG PO TABS
10.0000 mg | ORAL_TABLET | Freq: Every day | ORAL | Status: DC
  Administered 2023-12-19: 10 mg via ORAL
  Filled 2023-12-19: qty 1

## 2023-12-19 MED ORDER — ENOXAPARIN SODIUM 40 MG/0.4ML IJ SOSY
40.0000 mg | PREFILLED_SYRINGE | INTRAMUSCULAR | Status: DC
  Administered 2023-12-19: 40 mg via SUBCUTANEOUS
  Filled 2023-12-19: qty 0.4

## 2023-12-19 NOTE — ED Provider Triage Note (Signed)
Emergency Medicine Provider Triage Evaluation Note  Amy Hutchinson , a 63 y.o. female  was evaluated in triage.  Pt complains of dizziness and multiple witnessed syncopal episodes since Wednesday.  This has never happened to the patient before.  Patient's husband reports patient is out 3-5 minutes and has no recollection of what happened prior to the syncopal episodes.   Review of Systems  Positive:  Negative:   Physical Exam  BP 118/77 (BP Location: Left Arm)   Pulse 75   Temp 98.9 F (37.2 C) (Oral)   Resp 16   Ht 5\' 4"  (1.626 m)   Wt 76.2 kg   SpO2 94%   BMI 28.84 kg/m  Gen:   Awake, no distress   Resp:  Normal effort LCTAB MSK:   Moves extremities without difficulty  Other:    Medical Decision Making  Medically screening exam initiated at 12:44 PM.  Appropriate orders placed.  Amy Hutchinson was informed that the remainder of the evaluation will be completed by another provider, this initial triage assessment does not replace that evaluation, and the importance of remaining in the ED until their evaluation is complete.     Romeo Apple, Linsie Lupo A, PA-C 12/19/23 1248

## 2023-12-19 NOTE — Telephone Encounter (Signed)
It looks like they are asking for a call back.  This is unfortunately short notice for this type of work in appointment. I don't know if I can solve this issue with passing out multiple times with an office visit here on Friday afternoon. Even if we were to do labs or imaging we would not get any results until next week.  I agree that ED is safest for all testing to be completed and evaluation can be done.  Brief advice she should look into Home Epley maneuver if she experiences room spinning vertigo. Those home exercises can help reduce dizziness symptoms.  Saralyn Pilar, DO [facility] 12/19/2023, 1:56 PM

## 2023-12-19 NOTE — H&P (Signed)
History and Physical    Amy Hutchinson HKV:[id number] DOB: October 13, 1961 DOA: 12/19/2023  Referring MD/NP/PA:   PCP: Smitty Cords, DO   Patient coming from:  The patient is coming from home.     Chief Complaint: syncope  HPI: Amy Hutchinson is a 63 y.o. female with medical history significant of hyperlipidemia, depression with anxiety, GERD, tobacco abuse, chronic vertigo, resection for meningioma, rectovaginal fistula, allergy on Xolair injection, who presents with syncope.  Per patient and her husband at bedside, patient has had 5 times of syncope in the past 3 days.  The first episode happened when she was doing laundry and cleaning up the house, suddenly felt dizzy and passed out for few seconds.  No confusion after waking up.  Last night it happened again when she was watching TV.  Husband witnessed event.  No clonic seizure noted.  Patient does not have unilateral numbness or tingling extremities.  No facial droop or slurred speech.  No ear ringing.  No hearing loss.  Denies chest pain, cough, SOB.  No recent long traveling.  No tenderness in the calf area.  No nausea, vomiting, diarrhea or abdominal pain.  No symptoms of UTI.  Denies drinking alcohol or using drugs.  Patient reports that she has bilateral ear infection and was told to have fluid in her ear, today denies any ear drainage or ear pain.  Data reviewed independently and ED Course: pt was found to have WBC 10.3, GFR > 60, urinalysis not impressive, troponin 3--> 2, temperature normal, blood pressure 135/80, heart rate 75, RR 20, oxygen saturation 99% on room air.  CT of the head is negative for acute intracranial abnormalities.  Patient is placed on telemetry bed for observation.  EKG: I have personally reviewed.  Sinus rhythm, QTc 444, low voltage, early R wave progression, low voltage.   Review of Systems:   General: no fevers, chills, no body weight gain, has fatigue HEENT: no blurry vision, hearing changes or sore  throat Respiratory: no dyspnea, coughing, wheezing CV: no chest pain, no palpitations GI: no nausea, vomiting, abdominal pain, diarrhea, constipation GU: no dysuria, burning on urination, increased urinary frequency, hematuria  Ext: no leg edema Neuro: no unilateral weakness, numbness, or tingling, no vision change or hearing loss. Has syncope Skin: no rash, no skin tear. MSK: No muscle spasm, no deformity, no limitation of range of movement in spin Heme: No easy bruising.  Travel history: No recent long distant travel.   Allergy:  Allergies  Allergen Reactions   Codeine Nausea And Vomiting   Darvon [Propoxyphene] Nausea And Vomiting   Dilaudid [Hydromorphone] Nausea And Vomiting   Hydrocodone Nausea And Vomiting   Percocet [Oxycodone-Acetaminophen] Nausea And Vomiting    Past Medical History:  Diagnosis Date   Anxiety    Back pain    Depression    Hyperlipidemia    Recurrent upper respiratory infection (URI)     Past Surgical History:  Procedure Laterality Date   ABDOMINAL HYSTERECTOMY     BIOPSY BREAST Left 2021   bx coil clip   BRAIN SURGERY     NECK SURGERY     TONSILLECTOMY AND ADENOIDECTOMY      Social History:  reports that she has been smoking cigarettes. She has a 12.8 pack-year smoking history. She has never used smokeless tobacco. She reports that she does not currently use alcohol. She reports that she does not use drugs.  Family History:  Family History  Problem Relation Age of Onset  Diabetes Mother    Bipolar disorder Mother    Anxiety disorder Mother    Depression Mother    Lung cancer Mother        lung   Anxiety disorder Sister    Depression Sister    Anxiety disorder Sister    Colon cancer Neg Hx    Breast cancer Neg Hx      Prior to Admission medications   Medication Sig Start Date End Date Taking? Authorizing Provider  albuterol (VENTOLIN HFA) 108 (90 Base) MCG/ACT inhaler Inhale 1-2 puffs into the lungs every 6 (six) hours as needed  for wheezing or shortness of breath. Patient not taking: Reported on 07/31/2023 04/14/23   Lorre Munroe, NP  alprazolam Prudy Feeler) 2 MG tablet Take 0.5 tablets (1 mg total) by mouth 4 (four) times daily as needed for sleep or anxiety. 08/31/20   Karamalegos, Netta Neat, DO  azithromycin (ZITHROMAX) 250 MG tablet Take 2 tabs today, then 1 tab daily x 4 days Patient not taking: Reported on 07/31/2023 07/10/23   Lorre Munroe, NP  citalopram (CELEXA) 40 MG tablet Take 1 tablet (40 mg total) by mouth daily. 03/06/21   Karamalegos, Netta Neat, DO  EPINEPHrine 0.3 mg/0.3 mL IJ SOAJ injection Inject 0.3 mg into the muscle as needed for anaphylaxis. 02/27/23   Marcelyn Bruins, MD  EPITOL 200 MG tablet Take by mouth. Patient not taking: Reported on 02/06/2023 02/22/22   [provider]  famotidine (PEPCID) 20 MG tablet Take 1 tablet (20 mg total) by mouth 2 (two) times daily. 10/09/23   Marcelyn Bruins, MD  fexofenadine (EQ ALLERGY RELIEF) 180 MG tablet Take 1 tablet (180 mg total) by mouth daily. 10/09/23   Marcelyn Bruins, MD  fluticasone (FLONASE) 50 MCG/ACT nasal spray Place 2 sprays into both nostrils daily. Use for 4-6 weeks then stop and use seasonally or as needed. Patient not taking: Reported on 04/14/2023 12/31/22   Smitty Cords, DO  hydrOXYzine (ATARAX) 50 MG tablet Take 1 tablet (50 mg total) by mouth 3 (three) times daily as needed. Patient not taking: Reported on 05/01/2023 12/31/22   Smitty Cords, DO  meclizine (ANTIVERT) 25 MG tablet Take 1 tablet (25 mg total) by mouth 3 (three) times daily as needed for dizziness. Patient not taking: Reported on 05/01/2023 02/11/22   Smitty Cords, DO  montelukast (SINGULAIR) 10 MG tablet Take 1 tablet (10 mg total) by mouth at bedtime. Patient not taking: Reported on 07/31/2023 05/01/23   Marcelyn Bruins, MD  montelukast (SINGULAIR) 10 MG tablet Take 1 tablet (10 mg total) by mouth at  bedtime. 10/09/23   Marcelyn Bruins, MD  ondansetron (ZOFRAN-ODT) 4 MG disintegrating tablet Take 1 tablet (4 mg total) by mouth every 8 (eight) hours as needed. 12/09/23   Viviano Simas, FNP  QUEtiapine (SEROQUEL) 100 MG tablet Take 1 tablet (100 mg total) by mouth at bedtime. 06/28/20   Karamalegos, Netta Neat, DO  rosuvastatin (CRESTOR) 10 MG tablet Take 1 tablet (10 mg total) by mouth at bedtime. 06/02/23   Smitty Cords, DO    Physical Exam: Vitals:   12/19/23 1242 12/19/23 1734 12/19/23 1930 12/19/23 2138  BP:  135/80 128/73 128/73  Pulse:  64 69 69  Resp:  20 (!) 25 (!) 25  Temp:  97.8 F (36.6 C)  98.5 F (36.9 C)  TempSrc:  Oral  Oral  SpO2:  99% 96% 96%  Weight: 76.2 kg  Height: 5\' 4"  (1.626 m)      General: Not in acute distress HEENT:       Eyes: PERRL, EOMI, no jaundice       ENT: No discharge from the ears and nose, no pharynx injection, no tonsillar enlargement.        Neck: No JVD, no bruit, no mass felt. Heme: No neck lymph node enlargement. Cardiac: S1/S2, RRR, No murmurs, No gallops or rubs. Respiratory: No rales, wheezing, rhonchi or rubs. GI: Soft, nondistended, nontender, no rebound pain, no organomegaly, BS present. GU: No hematuria Ext: No pitting leg edema bilaterally. 1+DP/PT pulse bilaterally. Musculoskeletal: No joint deformities, No joint redness or warmth, no limitation of ROM in spin. Skin: No rashes.  Neuro: Alert, oriented X3, cranial nerves II-XII grossly intact, moves all extremities normally. Muscle strength 5/5 in all extremities, sensation to light touch intact.  Psych: Patient is not psychotic, no suicidal or hemocidal ideation.  Labs on Admission: I have personally reviewed following labs and imaging studies  CBC: Recent Labs  Lab 12/19/23 1247  WBC 10.3  HGB 14.0  HCT 43.4  MCV 91.6  PLT 316   Basic Metabolic Panel: Recent Labs  Lab 12/19/23 1247  NA 138  K 4.2  CL 107  CO2 22  GLUCOSE 110*  BUN 15   CREATININE 0.80  CALCIUM 9.3   GFR: Estimated Creatinine Clearance: 74.7 mL/min (by C-G formula based on SCr of 0.8 mg/dL). Liver Function Tests: Recent Labs  Lab 12/19/23 1247  AST 17  ALT 14  ALKPHOS 63  BILITOT 0.6  PROT 6.9  ALBUMIN 4.1   No results for input(s): "LIPASE", "AMYLASE" in the last 168 hours. No results for input(s): "AMMONIA" in the last 168 hours. Coagulation Profile: No results for input(s): "INR", "PROTIME" in the last 168 hours. Cardiac Enzymes: No results for input(s): "CKTOTAL", "CKMB", "CKMBINDEX", "TROPONINI" in the last 168 hours. BNP (last 3 results) No results for input(s): "PROBNP" in the last 8760 hours. HbA1C: No results for input(s): "HGBA1C" in the last 72 hours. CBG: Recent Labs  Lab 12/19/23 1749  GLUCAP 93   Lipid Profile: No results for input(s): "CHOL", "HDL", "LDLCALC", "TRIG", "CHOLHDL", "LDLDIRECT" in the last 72 hours. Thyroid Function Tests: No results for input(s): "TSH", "T4TOTAL", "FREET4", "T3FREE", "THYROIDAB" in the last 72 hours. Anemia Panel: No results for input(s): "VITAMINB12", "FOLATE", "FERRITIN", "TIBC", "IRON", "RETICCTPCT" in the last 72 hours. Urine analysis:    Component Value Date/Time   COLORURINE YELLOW (A) 12/19/2023 1247   APPEARANCEUR HAZY (A) 12/19/2023 1247   LABSPEC 1.008 12/19/2023 1247   PHURINE 6.0 12/19/2023 1247   GLUCOSEU NEGATIVE 12/19/2023 1247   HGBUR SMALL (A) 12/19/2023 1247   BILIRUBINUR NEGATIVE 12/19/2023 1247   KETONESUR NEGATIVE 12/19/2023 1247   PROTEINUR NEGATIVE 12/19/2023 1247   NITRITE NEGATIVE 12/19/2023 1247   LEUKOCYTESUR TRACE (A) 12/19/2023 1247   Sepsis Labs: @LABRCNTIP (procalcitonin:4,lacticidven:4) )No results found for this or any previous visit (from the past 240 hours).   Radiological Exams on Admission:   Assessment/Plan Principal Problem:   Syncope Active Problems:   Chronic vertigo   HLD (hyperlipidemia)   Depression with anxiety   Overweight  (BMI 25.0-29.9)   Assessment and Plan:  Syncope: Etiology is not clear.  CT head negative for acute intracranial abnormalities.  The differential diagnosis is broad, including vasovagal syncope, seizure given history of meningioma resection, TIA, drug abuse, orthostatic status, carotid artery stenosis.  - Place on progressive unit for obs - Orthostatic  vital signs  - Carotid doppler - MRI-brain - EEG - 2d echo - Neuro checks  - IVF: NS  1L - PT/OT eval and treat  Chronic vertigo -Fall precaution -As needed meclizine  HLD (hyperlipidemia) -Crestor  Depression with anxiety -Continue home medications  Overweight (BMI 25.0-29.9): Body weight 76.2 kg, BMI 28.84 -Encourage losing weight -Exercise and healthy diet       DVT ppx: SQ Lovenox  Code Status: Full code    Family Communication:  Yes, patient's husband at bed side.  Disposition Plan:  Anticipate discharge back to previous environment  Consults called:  none  Admission status and Level of care: Telemetry Medical:    for obs      Dispo: The patient is from: Home              Anticipated d/c is to: Home              Anticipated d/c date is: 1 day              Patient currently is not medically stable to d/c.    Severity of Illness:  The appropriate patient status for this patient is OBSERVATION. Observation status is judged to be reasonable and necessary in order to provide the required intensity of service to ensure the patient's safety. The patient's presenting symptoms, physical exam findings, and initial radiographic and laboratory data in the context of their medical condition is felt to place them at decreased risk for further clinical deterioration. Furthermore, it is anticipated that the patient will be medically stable for discharge from the hospital within 2 midnights of admission.        Date of Service 12/19/2023    Lorretta Harp [facility]   If 7PM-7AM, please contact  night-coverage [email or web address] 12/19/2023, 10:30 PM

## 2023-12-19 NOTE — Telephone Encounter (Signed)
  Chief Complaint: dizziness, room spinning at times. Has passed out in the past 3 days more than once.  Symptoms: dizziness lightheaded. Worse with standing and change in head position. Has passed out within the past 3 days . Gets dark and passes out. Was "sick" after christmas and  fell down stairs loss of balance. Unsure of hitting head.  Frequency: Wednesday  Pertinent Negatives: Patient denies chest pain no difficulty breathing  Disposition: [x] ED /[] Urgent Care (no appt availability in office) / [] Appointment(In office/virtual)/ []  [facility]/ [] Home Care/ [] Refused Recommended Disposition /[] [facility]/ []  Follow-up with PCP Additional Notes:   Instructed patient to go to ED now. Patient would like to hear from PCP if he feels she can be seen today . Pleas advise.         Reason for Disposition  Patient sounds very sick or weak to the triager  Answer Assessment - Initial Assessment Questions 1. DESCRIPTION: "Describe your dizziness."     Room spinning and passing out  2. LIGHTHEADED: "Do you feel lightheaded?" (e.g., somewhat faint, woozy, weak upon standing)     Dizziness now lightheaded . Does not feel faint now but has passed out last night and while talking to daughter on phone 3. VERTIGO: "Do you feel like either you or the room is spinning or tilting?" (i.e. vertigo)     Room spinning  4. SEVERITY: "How bad is it?"  "Do you feel like you are going to faint?" "Can you stand and walk?"   - MILD: Feels slightly dizzy, but walking normally.   - MODERATE: Feels unsteady when walking, but not falling; interferes with normal activities (e.g., school, work).   - SEVERE: Unable to walk without falling, or requires assistance to walk without falling; feels like passing out now.      Dizziness now but does not feel like passing out  5. ONSET:  "When did the dizziness begin?"     Wednesday  6. AGGRAVATING FACTORS: "Does anything make it worse?" (e.g.,  standing, change in head position)     Standing and change in head position  7. HEART RATE: "Can you tell me your heart rate?" "How many beats in 15 seconds?"  (Note: not all patients can do this)       na 8. CAUSE: "What do you think is causing the dizziness?"     Not sure  9. RECURRENT SYMPTOM: "Have you had dizziness before?" If Yes, ask: "When was the last time?" "What happened that time?"     na 10. OTHER SYMPTOMS: "Do you have any other symptoms?" (e.g., fever, chest pain, vomiting, diarrhea, bleeding)       When going to pass out everything gets dark , s/p "sick" after christmas  and fell down stairs. Unsure of hitting head but doesn't think so . Dizzy now while sitting but does not feel like passing out  11. PREGNANCY: "Is there any chance you are pregnant?" "When was your last menstrual period?"       na  Protocols used: Dizziness - Lightheadedness-A-AH

## 2023-12-19 NOTE — ED Triage Notes (Signed)
Pt states that she has passed out 5 times since 01/16/24, pt reports doing various things when it has happened, pt states that she has chronic dizziness but states since Jan 16, 2024 its been worse and then gets really dizzy and loses consciousness, denies hx of vertigo, states that her ears hurt all the time but has been told that they are fine

## 2023-12-19 NOTE — ED Provider Notes (Signed)
[facility] Provider Note    Event Date/Time   First MD Initiated Contact with Patient 12/19/23 1724     (approximate)   History   Dizziness and Loss of Consciousness   HPI  Amy Hutchinson is a 63 y.o. female who presents to the department today because of concerns for syncopal episodes.  Patient states she has had 5 syncopal episodes in the past 2 days.  She is somewhat aware that she is get a pass out and so was able to sit down.  This has happened when she has been lying in bed as well.  She denies any chest pain or palpitations.  She does have a history of dizziness which she says is somewhat chronic.  Apparently she is had her ears checked and states that that has been fine.  Patient has only passed out once before prior to these recent episodes.     Physical Exam   Triage Vital Signs: ED Triage Vitals  Encounter Vitals Group     BP 12/19/23 1241 118/77     Systolic BP Percentile --      Diastolic BP Percentile --      Pulse Rate 12/19/23 1241 75     Resp 12/19/23 1241 16     Temp 12/19/23 1241 98.9 F (37.2 C)     Temp Source 12/19/23 1241 Oral     SpO2 12/19/23 1241 94 %     Weight 12/19/23 1242 168 lb (76.2 kg)     Height 12/19/23 1242 5\' 4"  (1.626 m)     Head Circumference --      Peak Flow --      Pain Score 12/19/23 1241 0     Pain Loc --      Pain Education --      Exclude from Growth Chart --     Most recent vital signs: Vitals:   12/19/23 1241  BP: 118/77  Pulse: 75  Resp: 16  Temp: 98.9 F (37.2 C)  SpO2: 94%   General: Awake, alert, oriented. CV:  Good peripheral perfusion. Regular rate and rhythm. Resp:  Normal effort. Lungs clear. Abd:  No distention.   ED Results / Procedures / Treatments   Labs (all labs ordered are listed, but only abnormal results are displayed) Labs Reviewed  URINALYSIS, ROUTINE W REFLEX MICROSCOPIC - Abnormal; Notable for the following components:      Result Value   Color, Urine YELLOW  (*)    APPearance HAZY (*)    Hgb urine dipstick SMALL (*)    Leukocytes,Ua TRACE (*)    Bacteria, UA MANY (*)    All other components within normal limits  COMPREHENSIVE METABOLIC PANEL - Abnormal; Notable for the following components:   Glucose, Bld 110 (*)    All other components within normal limits  CBC  CBG MONITORING, ED  TROPONIN I (HIGH SENSITIVITY)  TROPONIN I (HIGH SENSITIVITY)     EKG  I, Phineas Semen, attending physician, personally viewed and interpreted this EKG  EKG Time: 1249 Rate: 79 Rhythm: normal sinus rhythm Axis: normal Intervals: qtc 444 QRS: narrow ST changes: no st elevation Impression: normal ekg   RADIOLOGY I independently interpreted and visualized the CT head. My interpretation: No bleed Radiology interpretation:  IMPRESSION:  No evidence of acute intracranial abnormality.      PROCEDURES:  Critical Care performed: No   MEDICATIONS ORDERED IN ED: Medications - No data to display   IMPRESSION / MDM /  ASSESSMENT AND PLAN / ED COURSE  I reviewed the triage vital signs and the nursing notes.                              Differential diagnosis includes, but is not limited to, anemia, electrolyte abnormality, arrhythmia, valve disease  Patient's presentation is most consistent with acute presentation with potential threat to life or bodily function.   The patient is on the cardiac monitor to evaluate for evidence of arrhythmia and/or significant heart rate changes.  Patient presented to the emergency department today because of concerns for syncopal episodes.  Patient has 5 in the past 2 days.  EKG without concerning arrhythmia.  Blood work without significant anemia or electrolyte abnormality.  Given frequency of syncopal episodes and concerning syncope while lying flat I do think patient would benefit from further workup and evaluation.  I discussed this with the patient.  Discussed with Dr. Clyde Lundborg with the hospitalist service who  will evaluate for admission.      FINAL CLINICAL IMPRESSION(S) / ED DIAGNOSES   Final diagnoses:  Syncope, unspecified syncope type      Note:  This document was prepared using Dragon voice recognition software and may include unintentional dictation errors.    Phineas Semen, MD 12/19/23 [phone]

## 2023-12-20 ENCOUNTER — Encounter: Payer: Self-pay | Admitting: Internal Medicine

## 2023-12-20 ENCOUNTER — Observation Stay
Admit: 2023-12-20 | Discharge: 2023-12-20 | Disposition: A | Discharge status: Home / Self Care | Payer: BC Managed Care – PPO | Attending: Student | Admitting: Student

## 2023-12-20 DIAGNOSIS — R55 Syncope and collapse: Secondary | ICD-10-CM | POA: Diagnosis not present

## 2023-12-20 LAB — HIV ANTIBODY (ROUTINE TESTING W REFLEX): HIV Screen 4th Generation wRfx: NONREACTIVE

## 2023-12-20 MED ORDER — INFLUENZA VIRUS VACC SPLIT PF (FLUZONE) 0.5 ML IM SUSY: 0.5000 mL | PREFILLED_SYRINGE | INTRAMUSCULAR | Status: DC

## 2023-12-20 MED ORDER — MECLIZINE HCL 25 MG PO TABS: 25.0000 mg | ORAL_TABLET | Freq: Three times a day (TID) | ORAL | 0 refills | Status: DC | PRN

## 2023-12-20 MED ORDER — PNEUMOCOCCAL 20-VAL CONJ VACC 0.5 ML IM SUSY: 0.5000 mL | PREFILLED_SYRINGE | INTRAMUSCULAR | Status: DC

## 2023-12-20 MED ORDER — MIDODRINE HCL 5 MG PO TABS: 5.0000 mg | ORAL_TABLET | Freq: Three times a day (TID) | ORAL | 2 refills | Status: AC | PRN

## 2023-12-20 NOTE — ED Notes (Signed)
ED TO INPATIENT HANDOFF REPORT  ED Nurse Name and Phone #: [phone]  S Name/Age/Gender Hilliard Clark 63 y.o. female Room/Bed: ED06A/ED06A  Code Status   Code Status: Full Code  Home/SNF/Other Home Patient oriented to: self, place, time, and situation Is this baseline? Yes   Triage Complete: Triage complete  Chief Complaint Syncope [R55]  Triage Note Pt states that she has passed out 5 times since January 25, 2024, pt reports doing various things when it has happened, pt states that she has chronic dizziness but states since 2024-01-25 its been worse and then gets really dizzy and loses consciousness, denies hx of vertigo, states that her ears hurt all the time but has been told that they are fine   Allergies Allergies  Allergen Reactions   Codeine Nausea And Vomiting   Darvon [Propoxyphene] Nausea And Vomiting   Dilaudid [Hydromorphone] Nausea And Vomiting   Hydrocodone Nausea And Vomiting   Percocet [Oxycodone-Acetaminophen] Nausea And Vomiting    Level of Care/Admitting Diagnosis ED Disposition     ED Disposition  Admit   Condition  --   Comment  Hospital Area: [facility] [[id number]]  Level of Care: Telemetry Medical [104]  Covid Evaluation: Asymptomatic - no recent exposure (last 10 days) testing not required  Diagnosis: Syncope [206001]  Admitting Physician: Lorretta Harp [[id number]]  Attending Physician: Lorretta Harp [[id number]]          B Medical/Surgery History Past Medical History:  Diagnosis Date   Anxiety    Back pain    Depression    Hyperlipidemia    Recurrent upper respiratory infection (URI)    Past Surgical History:  Procedure Laterality Date   ABDOMINAL HYSTERECTOMY     BIOPSY BREAST Left 2021   bx coil clip   BRAIN SURGERY     NECK SURGERY     TONSILLECTOMY AND ADENOIDECTOMY       A IV Location/Drains/Wounds Patient Lines/Drains/Airways Status     Active Line/Drains/Airways     Name Placement date Placement time Site Days    Peripheral IV 12/19/23 20 G Left Antecubital 12/19/23  1749  Antecubital  1            Intake/Output Last 24 hours  Intake/Output Summary (Last 24 hours) at 12/20/2023 0448 Last data filed at 12/19/2023 2101 Gross per 24 hour  Intake 1064.6 ml  Output --  Net 1064.6 ml    Labs/Imaging Results for orders placed or performed during the hospital encounter of 12/19/23 (from the past 48 hours)  CBC     Status: None   Collection Time: 12/19/23 12:47 PM  Result Value Ref Range   WBC 10.3 4.0 - 10.5 K/uL   RBC 4.74 3.87 - 5.11 MIL/uL   Hemoglobin 14.0 12.0 - 15.0 g/dL   HCT 95.6 21.3 - 08.6 %   MCV 91.6 80.0 - 100.0 fL   MCH 29.5 26.0 - 34.0 pg   MCHC 32.3 30.0 - 36.0 g/dL   RDW 57.8 46.9 - 62.9 %   Platelets 316 150 - 400 K/uL   nRBC 0.0 0.0 - 0.2 %    Comment: Performed at [facility], [address] [place], [place] [address]  Urinalysis, Routine w reflex microscopic -Urine, Clean Catch     Status: Abnormal   Collection Time: 12/19/23 12:47 PM  Result Value Ref Range   Color, Urine YELLOW (A) YELLOW   APPearance HAZY (A) CLEAR   Specific Gravity, Urine 1.008 1.005 - 1.030   pH 6.0  5.0 - 8.0   Glucose, UA NEGATIVE NEGATIVE mg/dL   Hgb urine dipstick SMALL (A) NEGATIVE   Bilirubin Urine NEGATIVE NEGATIVE   Ketones, ur NEGATIVE NEGATIVE mg/dL   Protein, ur NEGATIVE NEGATIVE mg/dL   Nitrite NEGATIVE NEGATIVE   Leukocytes,Ua TRACE (A) NEGATIVE   RBC / HPF 0-5 0 - 5 RBC/hpf   WBC, UA 0-5 0 - 5 WBC/hpf   Bacteria, UA MANY (A) NONE SEEN   Squamous Epithelial / HPF 0-5 0 - 5 /HPF   Mucus PRESENT     Comment: Performed at [facility], [address] [place], [place] [address]  Troponin I (High Sensitivity)     Status: None   Collection Time: 12/19/23 12:47 PM  Result Value Ref Range   Troponin I (High Sensitivity) 3 <18 ng/L    Comment: (NOTE) Elevated high sensitivity troponin I (hsTnI) values and significant  changes across serial measurements may  suggest ACS but many other  chronic and acute conditions are known to elevate hsTnI results.  Refer to the "Links" section for chest pain algorithms and additional  guidance. Performed at [facility], [address] [place], [place] [address]   Comprehensive metabolic panel     Status: Abnormal   Collection Time: 12/19/23 12:47 PM  Result Value Ref Range   Sodium 138 135 - 145 mmol/L   Potassium 4.2 3.5 - 5.1 mmol/L   Chloride 107 98 - 111 mmol/L   CO2 22 22 - 32 mmol/L   Glucose, Bld 110 (H) 70 - 99 mg/dL    Comment: Glucose reference range applies only to samples taken after fasting for at least 8 hours.   BUN 15 6 - 20 mg/dL   Creatinine, Ser 0.98 0.44 - 1.00 mg/dL   Calcium 9.3 8.9 - 11.9 mg/dL   Total Protein 6.9 6.5 - 8.1 g/dL   Albumin 4.1 3.5 - 5.0 g/dL   AST 17 15 - 41 U/L   ALT 14 0 - 44 U/L   Alkaline Phosphatase 63 38 - 126 U/L   Total Bilirubin 0.6 0.0 - 1.2 mg/dL   GFR, Estimated >14 >78 mL/min    Comment: (NOTE) Calculated using the CKD-EPI Creatinine Equation (2021)    Anion gap 9 5 - 15    Comment: Performed at [facility], [address], [place], [place] [address]  Troponin I (High Sensitivity)     Status: None   Collection Time: 12/19/23  5:35 PM  Result Value Ref Range   Troponin I (High Sensitivity) 2 <18 ng/L    Comment: (NOTE) Elevated high sensitivity troponin I (hsTnI) values and significant  changes across serial measurements may suggest ACS but many other  chronic and acute conditions are known to elevate hsTnI results.  Refer to the "Links" section for chest pain algorithms and additional  guidance. Performed at [facility], [address] [place], [place] [address]   CBG monitoring, ED     Status: None   Collection Time: 12/19/23  5:49 PM  Result Value Ref Range   Glucose-Capillary 93 70 - 99 mg/dL    Comment: Glucose reference range applies only to samples taken after fasting for at least 8 hours.   Urine Drug Screen, Qualitative ([facility] only)     Status: Abnormal   Collection Time: 12/19/23  8:01 PM  Result Value Ref Range   Tricyclic, Ur Screen POSITIVE (A) NONE DETECTED   Amphetamines, Ur Screen NONE DETECTED NONE DETECTED  MDMA (Ecstasy)Ur Screen NONE DETECTED NONE DETECTED   Cocaine Metabolite,Ur Wal[place] NONE DETECTED NONE DETECTED   Opiate, Ur Screen NONE DETECTED NONE DETECTED   Phencyclidine (PCP) Ur S NONE DETECTED NONE DETECTED   Cannabinoid 50 Ng, Ur [place] NONE DETECTED NONE DETECTED   Barbiturates, Ur Screen NONE DETECTED NONE DETECTED   Benzodiazepine, Ur Scrn POSITIVE (A) NONE DETECTED   Methadone Scn, Ur NONE DETECTED NONE DETECTED    Comment: (NOTE) Tricyclics + metabolites, urine    Cutoff 1000 ng/mL Amphetamines + metabolites, urine  Cutoff 1000 ng/mL MDMA (Ecstasy), urine              Cutoff 500 ng/mL Cocaine Metabolite, urine          Cutoff 300 ng/mL Opiate + metabolites, urine        Cutoff 300 ng/mL Phencyclidine (PCP), urine         Cutoff 25 ng/mL Cannabinoid, urine                 Cutoff 50 ng/mL Barbiturates + metabolites, urine  Cutoff 200 ng/mL Benzodiazepine, urine              Cutoff 200 ng/mL Methadone, urine                   Cutoff 300 ng/mL  The urine drug screen provides only a preliminary, unconfirmed analytical test result and should not be used for non-medical purposes. Clinical consideration and professional judgment should be applied to any positive drug screen result due to possible interfering substances. A more specific alternate chemical method must be used in order to obtain a confirmed analytical result. Gas chromatography / mass spectrometry (GC/MS) is the preferred confirm atory method. Performed at [facility], [address] [place], [place] [address]    MR BRAIN WO CONTRAST Result Date: 12/19/2023 CLINICAL DATA:  Recurrent syncope EXAM: MRI HEAD WITHOUT CONTRAST TECHNIQUE: Multiplanar, multiecho pulse sequences of the  brain and surrounding structures were obtained without intravenous contrast. COMPARISON:  None Available. FINDINGS: Brain: No acute infarct, mass effect or extra-axial collection. No acute or chronic hemorrhage. Normal white matter signal, parenchymal volume and CSF spaces. The midline structures are normal. Vascular: Normal flow voids. Skull and upper cervical spine: Normal calvarium and skull base. Visualized upper cervical spine and soft tissues are normal. Sinuses/Orbits:No paranasal sinus fluid levels or advanced mucosal thickening. No mastoid or middle ear effusion. Normal orbits. IMPRESSION: Normal brain MRI. Electronically Signed   By: Deatra Robinson M.D.   On: 12/19/2023 23:40   US Carotid Bilateral Result Date: 12/19/2023 CLINICAL DATA:  Syncope, hyperlipidemia, tobacco abuse EXAM: BILATERAL CAROTID DUPLEX ULTRASOUND TECHNIQUE: Wallace Cullens scale imaging, color Doppler and duplex ultrasound were performed of bilateral carotid and vertebral arteries in the neck. COMPARISON:  None Available. FINDINGS: Criteria: Quantification of carotid stenosis is based on velocity parameters that correlate the residual internal carotid diameter with NASCET-based stenosis levels, using the diameter of the distal internal carotid lumen as the denominator for stenosis measurement. The following velocity measurements were obtained: RIGHT ICA: 93/36 cm/sec CCA: 75/24 cm/sec SYSTOLIC ICA/CCA RATIO:  1.2 ECA: 84 cm/sec LEFT ICA: 90/32 cm/sec CCA: 64/17 cm/sec SYSTOLIC ICA/CCA RATIO:  1.4 ECA: 99 cm/sec RIGHT CAROTID ARTERY: No significant atherosclerosis. RIGHT VERTEBRAL ARTERY:  Antegrade LEFT CAROTID ARTERY: Mild atheromatous plaque at the carotid bifurcation. LEFT VERTEBRAL ARTERY:  Antegrade IMPRESSION: 1. No evidence of flow-limiting stenosis within either internal carotid artery. Estimated 0-49% stenosis bilaterally. Electronically Signed  By: Sharlet Salina M.D.   On: 12/19/2023 20:53   CT HEAD WO CONTRAST Result Date:  12/19/2023 CLINICAL DATA:  Syncope/presyncope, cerebrovascular cause suspected EXAM: CT HEAD WITHOUT CONTRAST TECHNIQUE: Contiguous axial images were obtained from the base of the skull through the vertex without intravenous contrast. RADIATION DOSE REDUCTION: This exam was performed according to the departmental dose-optimization program which includes automated exposure control, adjustment of the mA and/or kV according to patient size and/or use of iterative reconstruction technique. COMPARISON:  CT head 11/10/2022 FINDINGS: Brain: Clear sinuses.  No acute findings. Vascular: No hyperdense vessel. Skull: No acute fracture. Sinuses/Orbits: No visible acute orbital abnormality. Clear sinuses. Remote plate and screw left supraorbital fixation. Other: No mastoid effusions. IMPRESSION: No evidence of acute intracranial abnormality. Electronically Signed   By: Feliberto Harts M.D.   On: 12/19/2023 14:15    Pending Labs Unresulted Labs (From admission, onward)     Start     Ordered   12/19/23 1834  HIV Antibody (routine testing w rflx)  (HIV Antibody (Routine testing w reflex) panel)  Once,   R        12/19/23 1834            Vitals/Pain Today's Vitals   12/19/23 2349 12/20/23 0130 12/20/23 0310 12/20/23 0313  BP: 139/86  114/64 114/64  Pulse: 70 74 77 77  Resp: 11 16 19 19   Temp: 97.6 F (36.4 C)   97.8 F (36.6 C)  TempSrc: Oral   Oral  SpO2: 95% 92% 94% 95%  Weight:      Height:      PainSc: 0-No pain   0-No pain    Isolation Precautions No active isolations  Medications Medications  meclizine (ANTIVERT) tablet 25 mg (has no administration in time range)  nicotine (NICODERM CQ - dosed in mg/24 hours) patch 21 mg (21 mg Transdermal Patch Applied 12/19/23 1842)  ondansetron (ZOFRAN) injection 4 mg (has no administration in time range)  acetaminophen (TYLENOL) tablet 650 mg (has no administration in time range)  enoxaparin (LOVENOX) injection 40 mg (40 mg Subcutaneous Given  12/19/23 1952)  LORazepam (ATIVAN) injection 2 mg (has no administration in time range)  rosuvastatin (CRESTOR) tablet 10 mg (10 mg Oral Given 12/19/23 2345)  ALPRAZolam (XANAX) tablet 1 mg (has no administration in time range)  citalopram (CELEXA) tablet 40 mg (40 mg Oral Given 12/19/23 2345)  QUEtiapine (SEROQUEL) tablet 100 mg (100 mg Oral Given 12/19/23 2345)  montelukast (SINGULAIR) tablet 10 mg (10 mg Oral Given 12/19/23 2345)  sodium chloride 0.9 % bolus 1,000 mL (0 mLs Intravenous Stopped 12/19/23 2101)    Mobility walks     Focused Assessments Neuro Assessment Handoff:  Swallow screen pass?    Cardiac Rhythm: Normal sinus rhythm NIH Stroke Scale  Dizziness Present: No Headache Present: No Interval: Shift assessment Level of Consciousness (1a.)   : Alert, keenly responsive LOC Questions (1b. )   : Answers both questions correctly LOC Commands (1c. )   : Performs both tasks correctly Best Gaze (2. )  : Normal Visual (3. )  : No visual loss Facial Palsy (4. )    : Normal symmetrical movements Motor Arm, Left (5a. )   : No drift Motor Arm, Right (5b. ) : No drift Motor Leg, Left (6a. )  : No drift Motor Leg, Right (6b. ) : No drift Limb Ataxia (7. ): Absent Sensory (8. )  : Normal, no sensory loss Best Language (9. )  :  No aphasia Dysarthria (10. ): Normal Extinction/Inattention (11.)   : No Abnormality Complete NIHSS TOTAL: 0     Neuro Assessment: Within Defined Limits Neuro Checks:   Initial (12/19/23 2000)  Has TPA been given? No If patient is a Neuro Trauma and patient is going to OR before floor call report to 4N Charge nurse: [phone] or [phone]   R Recommendations: See Admitting Provider Note  Report given to:   Additional Notes:

## 2023-12-20 NOTE — Discharge Summary (Signed)
[facility] Discharge Summary   Patient: Amy Hutchinson ZOX:[id number]  PCP: Smitty Cords, DO  Date of admission: 12/19/2023   Date of discharge: 12/20/2023     Discharge Diagnoses:  Principal Problem:   Syncope Active Problems:   Chronic vertigo   HLD (hyperlipidemia)   Depression with anxiety   Overweight (BMI 25.0-29.9)   Admitted From: Home Disposition:  Home   Recommendations for Outpatient Follow-up:  F/u with PCP in 1 wk, monitor BP at home and take Midodrine if low BP and symptomatic.  Echo as outpatient, it was delayed and she wants to go home and get it done as am outpatient. Follow up LABS/TEST: 2D echocardiogram   Diet recommendation: Regular diet  Activity: The patient is advised to gradually reintroduce usual activities, as tolerated  Discharge Condition: stable  Code Status: Full code   History of present illness: As per the H and P dictated on admission Hospital Course:  Amy Hutchinson is a 63 y.o. female with medical history significant of hyperlipidemia, depression with anxiety, GERD, tobacco abuse, chronic vertigo, resection for meningioma, rectovaginal fistula, allergy on Xolair injection, who presents with syncope.  As per patient she had 5 episode of syncope in the past 3 days.  As per description most likely it is due to orthostatic hypotension.  No seizure activity was noticed.  Labs and imaging reviewed, no any significant findings.  Assessment and plan  # Syncope most likely due to orthostatic hypotension CT head negative, MRI brain negative, carotid duplex 1 to 49% stenosis bilaterally.  EEG was ordered but less likely seizure activity.  It could not be done today and patient wanted to go home.  Less likely seizure.  2D echocardiogram was ordered but it was not done, patient would like to get it done as an outpatient.  As she wanted to go home.  Orthostatics positive, patient's blood pressure dropped from sitting to standing position but  patient was not dizzy at that time, she stated when she closed her eyes at that time she was feeling little dizzy. Prescribed midodrine 5 mg p.o. 3 times daily if systolic BP less than 120 and symptomatic/dizzy.  Patient was advised to keep systolic BP in 140s and also she can take an additional dose of midodrine if blood pressure persistently low or symptomatic. Continue to monitor BP and follow with PCP for further management as an outpatient.  Patient may need hormonal workup as an outpatient if persistent orthostatic hypotension.  # Vertigo, continue fall precautions and prescribed meclizine as needed.  Follow with PCP. # Hyperlipidemia, continued Crestor. # Depression and anxiety: Resume home medications at discharge.   Body mass index is 28.15 kg/m.  Nutrition Interventions:  - Patient was instructed, not to drive, operate heavy machinery, perform activities at heights, swimming or participation in water activities or provide baby sitting services while on Pain, Sleep and Anxiety Medications; until her outpatient Physician has advised to do so again.  - Also recommended to not to take more than prescribed Pain, Sleep and Anxiety Medications.  Patient was ambulatory without any assistance. On the day of the discharge the patient's vitals were stable, and no other acute medical condition were reported by patient. the patient was felt safe to be discharge at Home.  Consultants: None Procedures: None  Discharge Exam: General: Appear in no distress, no Rash; Oral Mucosa Clear, moist. Cardiovascular: S1 and S2 Present, no Murmur, Respiratory: normal respiratory effort, Bilateral Air entry present and no Crackles, no wheezes Abdomen:  Bowel Sound present, Soft and no tenderness, no hernia Extremities: no Pedal edema, no calf tenderness Neurology: alert and oriented to time, place, and person affect appropriate.  Filed Weights   12/19/23 1242 12/20/23 0936  Weight: 76.2 kg 74.4 kg    Vitals:   12/20/23 1006 12/20/23 1310  BP: 97/70 116/78  Pulse: 88   Resp:    Temp:    SpO2: 98%     DISCHARGE MEDICATION: Allergies as of 12/20/2023       Reactions   Codeine Nausea And Vomiting   Darvon [propoxyphene] Nausea And Vomiting   Dilaudid [hydromorphone] Nausea And Vomiting   Hydrocodone Nausea And Vomiting   Percocet [oxycodone-acetaminophen] Nausea And Vomiting        Medication List     TAKE these medications    alprazolam 2 MG tablet Commonly known as: XANAX Take 0.5 tablets (1 mg total) by mouth 4 (four) times daily as needed for sleep or anxiety.   citalopram 40 MG tablet Commonly known as: CELEXA Take 1 tablet (40 mg total) by mouth daily.   EPINEPHrine 0.3 mg/0.3 mL Soaj injection Commonly known as: EPI-PEN Inject 0.3 mg into the muscle as needed for anaphylaxis.   meclizine 25 MG tablet Commonly known as: ANTIVERT Take 1 tablet (25 mg total) by mouth 3 (three) times daily as needed for dizziness.   midodrine 5 MG tablet Commonly known as: PROAMATINE Take 1 tablet (5 mg total) by mouth 3 (three) times daily as needed (if Systolic BP <120 and sympotomatic/Dizzy (may take an other 5 mg if persistant low BP and symptomatic)).   montelukast 10 MG tablet Commonly known as: Singulair Take 1 tablet (10 mg total) by mouth at bedtime.   ondansetron 4 MG disintegrating tablet Commonly known as: ZOFRAN-ODT Take 1 tablet (4 mg total) by mouth every 8 (eight) hours as needed.   QUEtiapine 100 MG tablet Commonly known as: SEROQUEL Take 1 tablet (100 mg total) by mouth at bedtime.   rosuvastatin 10 MG tablet Commonly known as: CRESTOR Take 1 tablet (10 mg total) by mouth at bedtime.       Allergies  Allergen Reactions   Codeine Nausea And Vomiting   Darvon [Propoxyphene] Nausea And Vomiting   Dilaudid [Hydromorphone] Nausea And Vomiting   Hydrocodone Nausea And Vomiting   Percocet [Oxycodone-Acetaminophen] Nausea And Vomiting    Discharge Instructions     Call MD for:  difficulty breathing, headache or visual disturbances   Complete by: As directed    Call MD for:  extreme fatigue   Complete by: As directed    Call MD for:  persistant dizziness or light-headedness   Complete by: As directed    Call MD for:  persistant nausea and vomiting   Complete by: As directed    Call MD for:  severe uncontrolled pain   Complete by: As directed    Diet - low sodium heart healthy   Complete by: As directed    Discharge instructions   Complete by: As directed    F/u with PCP in 1 wk, monitor BP at home and take Midodrine if low BP and symptomatic.  Echo as outpatient, it was delayed and she wants to go home and get it done as am outpatient.   Increase activity slowly   Complete by: As directed        The results of significant diagnostics from this hospitalization (including imaging, microbiology, ancillary and laboratory) are listed below for reference.  Significant Diagnostic Studies: MR BRAIN WO CONTRAST Result Date: 12/19/2023 CLINICAL DATA:  Recurrent syncope EXAM: MRI HEAD WITHOUT CONTRAST TECHNIQUE: Multiplanar, multiecho pulse sequences of the brain and surrounding structures were obtained without intravenous contrast. COMPARISON:  None Available. FINDINGS: Brain: No acute infarct, mass effect or extra-axial collection. No acute or chronic hemorrhage. Normal white matter signal, parenchymal volume and CSF spaces. The midline structures are normal. Vascular: Normal flow voids. Skull and upper cervical spine: Normal calvarium and skull base. Visualized upper cervical spine and soft tissues are normal. Sinuses/Orbits:No paranasal sinus fluid levels or advanced mucosal thickening. No mastoid or middle ear effusion. Normal orbits. IMPRESSION: Normal brain MRI. Electronically Signed   By: Deatra Robinson M.D.   On: 12/19/2023 23:40   US Carotid Bilateral Result Date: 12/19/2023 CLINICAL DATA:  Syncope, hyperlipidemia,  tobacco abuse EXAM: BILATERAL CAROTID DUPLEX ULTRASOUND TECHNIQUE: Wallace Cullens scale imaging, color Doppler and duplex ultrasound were performed of bilateral carotid and vertebral arteries in the neck. COMPARISON:  None Available. FINDINGS: Criteria: Quantification of carotid stenosis is based on velocity parameters that correlate the residual internal carotid diameter with NASCET-based stenosis levels, using the diameter of the distal internal carotid lumen as the denominator for stenosis measurement. The following velocity measurements were obtained: RIGHT ICA: 93/36 cm/sec CCA: 75/24 cm/sec SYSTOLIC ICA/CCA RATIO:  1.2 ECA: 84 cm/sec LEFT ICA: 90/32 cm/sec CCA: 64/17 cm/sec SYSTOLIC ICA/CCA RATIO:  1.4 ECA: 99 cm/sec RIGHT CAROTID ARTERY: No significant atherosclerosis. RIGHT VERTEBRAL ARTERY:  Antegrade LEFT CAROTID ARTERY: Mild atheromatous plaque at the carotid bifurcation. LEFT VERTEBRAL ARTERY:  Antegrade IMPRESSION: 1. No evidence of flow-limiting stenosis within either internal carotid artery. Estimated 0-49% stenosis bilaterally. Electronically Signed   By: Sharlet Salina M.D.   On: 12/19/2023 20:53   CT HEAD WO CONTRAST Result Date: 12/19/2023 CLINICAL DATA:  Syncope/presyncope, cerebrovascular cause suspected EXAM: CT HEAD WITHOUT CONTRAST TECHNIQUE: Contiguous axial images were obtained from the base of the skull through the vertex without intravenous contrast. RADIATION DOSE REDUCTION: This exam was performed according to the departmental dose-optimization program which includes automated exposure control, adjustment of the mA and/or kV according to patient size and/or use of iterative reconstruction technique. COMPARISON:  CT head 11/10/2022 FINDINGS: Brain: Clear sinuses.  No acute findings. Vascular: No hyperdense vessel. Skull: No acute fracture. Sinuses/Orbits: No visible acute orbital abnormality. Clear sinuses. Remote plate and screw left supraorbital fixation. Other: No mastoid effusions.  IMPRESSION: No evidence of acute intracranial abnormality. Electronically Signed   By: Feliberto Harts M.D.   On: 12/19/2023 14:15    Microbiology: No results found for this or any previous visit (from the past 240 hours).   Labs: CBC: Recent Labs  Lab 12/19/23 1247  WBC 10.3  HGB 14.0  HCT 43.4  MCV 91.6  PLT 316   Basic Metabolic Panel: Recent Labs  Lab 12/19/23 1247  NA 138  K 4.2  CL 107  CO2 22  GLUCOSE 110*  BUN 15  CREATININE 0.80  CALCIUM 9.3   Liver Function Tests: Recent Labs  Lab 12/19/23 1247  AST 17  ALT 14  ALKPHOS 63  BILITOT 0.6  PROT 6.9  ALBUMIN 4.1   No results for input(s): "LIPASE", "AMYLASE" in the last 168 hours. No results for input(s): "AMMONIA" in the last 168 hours. Cardiac Enzymes: No results for input(s): "CKTOTAL", "CKMB", "CKMBINDEX", "TROPONINI" in the last 168 hours. BNP (last 3 results) No results for input(s): "BNP" in the last 8760 hours. CBG: Recent Labs  Lab  12/19/23 1749  GLUCAP 93    Time spent: 35 minutes  Signed:  Gillis Santa  [facility] 12/20/2023 3:27 PM

## 2023-12-20 NOTE — Evaluation (Signed)
Physical Therapy Evaluation Co-eval with OT Patient Details Name: Amy Hutchinson MRN: [id number] DOB: 11/19/1961 Today's Date: 12/20/2023  History of Present Illness  63 y.o. female with PMHx: hyperlipidemia, depression with anxiety, GERD, tobacco abuse, chronic vertigo, resection for meningioma, rectovaginal fistula, allergy on Xolair injection, who presents with syncope. Normal brain MRI.  Clinical Impression  Patient admitted with recent episodes of syncope, etiology unclear. Patient lives at home with her spouse. She reports being independent in all ADLs prior to admittance. She is retired but was still driving. Patient reports feeling like she would get tunnel vision and/or room would start spinning; she would sit down and then wake up later unsure what happened. Spouse witness one syncope episode. She denies any recent falls. She reports them occurring different times of day and during different activities. Pt denies any dizziness during PT/OT evaluation. She exhibits normal sitting and standing balance. She exhibits mild sway during Rhomberg and Sharpened Rhomberg with eyes closed but was able to maintain stability without loss of balance. She exhibits good reaction strategies to pertubation. Patient ambulated x175 feet mod I with reciprocal gait pattern, slightly slower gait speed. She reports this is her normal walking speed. Orthostatic vitals assessed with drop in systolic noted from sit to stand. Unsure if this could be contributing to syncope however as patient reports most of her syncope has occurred when sitting down. PT did educate patient on transitioning slower and to pump arms if necessary to help with raising BP. Patient does report low intake from decreased appetite. Unsure if that could be contributing to syncope. No acute care skilled needs identified at this time. Patient does report history of vertigo and feeling fluid in ears. Recommend patient follow up with ENT.      If plan is  discharge home, recommend the following: Assist for transportation   Can travel by private vehicle        Equipment Recommendations None recommended by PT  Recommendations for Other Services       Functional Status Assessment Patient has not had a recent decline in their functional status     Precautions / Restrictions Precautions Precautions: Fall Precaution Comments: recent syncope Restrictions Weight Bearing Restrictions Per Provider Order: No      Mobility  Bed Mobility Overal bed mobility: Modified Independent                  Transfers Overall transfer level: Modified independent                 General transfer comment: MOD I for bed mobility, STS and IND mobility in the hallway with no AD use    Ambulation/Gait Ambulation/Gait assistance: Modified independent (Device/Increase time) Gait Distance (Feet): 175 Feet Assistive device: None   Gait velocity: decreased     General Gait Details: Pt ambulated in hallway with reciprocal gait pattern, no instabilty but slower gait speed  Stairs            Wheelchair Mobility     Tilt Bed    Modified Rankin (Stroke Patients Only)       Balance Overall balance assessment: Independent, Needs assistance   Sitting balance-Leahy Scale: Normal       Standing balance-Leahy Scale: Good Standing balance comment: Pt able to maintain balance against pertubations with good reaction strategies and no instability     Tandem Stance - Right Leg: 30 (mild sway with eyes closed but no instability)   Rhomberg - Eyes Opened: 30 (normal, no sway)  Rhomberg - Eyes Closed: 30 (mild sway but no instability)   High Level Balance Comments: denies any dizziness and exhibits no instability when looking up             Pertinent Vitals/Pain Pain Assessment Pain Assessment: No/denies pain    Home Living Family/patient expects to be discharged to:: Private residence Living Arrangements:  Spouse/significant other Available Help at Discharge: Family;Available PRN/intermittently (husband works during the day 4am-3pm) Type of Home: Apartment (2nd level) Home Access: Stairs to enter Entrance Stairs-Rails: Can reach both;Left;Right Entrance Stairs-Number of Steps: 1 flight 2 sets of 5-6 steps   Home Layout: One level Home Equipment: Agricultural consultant (2 wheels)      Prior Function Prior Level of Function : Independent/Modified Independent;Driving;Working/employed             Mobility Comments: IND; no AD use; denies falls ADLs Comments: IND with ADL/IADLs; driving and community mobile; works from home     Extremity/Trunk Assessment   Upper Extremity Assessment Upper Extremity Assessment: Overall WFL for tasks assessed    Lower Extremity Assessment Lower Extremity Assessment: Overall WFL for tasks assessed    Cervical / Trunk Assessment Cervical / Trunk Assessment: Normal  Communication   Communication Communication: No apparent difficulties  Cognition Arousal: Alert Behavior During Therapy: WFL for tasks assessed/performed Overall Cognitive Status: Within Functional Limits for tasks assessed                                          General Comments General comments (skin integrity, edema, etc.): pt with minimal to no dizziness throughout evaluation    Exercises Other Exercises Other Exercises: Provided education on limited positional changes or doing them very slowly when she has to with pt verbalizing understanding to prevent dizziness. Also educated patient about orthostatic hypotension and to try pumping arms if she feels light headed or dizzy with postional change.   Assessment/Plan    PT Assessment Patient does not need any further PT services  PT Problem List         PT Treatment Interventions      PT Goals (Current goals can be found in the Care Plan section)  Acute Rehab PT Goals Patient Stated Goal: to go home PT Goal  Formulation: With patient Time For Goal Achievement: 12/20/23 Potential to Achieve Goals: Good    Frequency       Co-evaluation   Reason for Co-Treatment: For patient/therapist safety PT goals addressed during session: Mobility/safety with mobility OT goals addressed during session: ADL's and self-care       AM-PAC PT "6 Clicks" Mobility  Outcome Measure Help needed turning from your back to your side while in a flat bed without using bedrails?: None Help needed moving from lying on your back to sitting on the side of a flat bed without using bedrails?: None Help needed moving to and from a bed to a chair (including a wheelchair)?: None Help needed standing up from a chair using your arms (e.g., wheelchair or bedside chair)?: None Help needed to walk in hospital room?: None Help needed climbing 3-5 steps with a railing? : None 6 Click Score: 24    End of Session Equipment Utilized During Treatment: Gait belt Activity Tolerance: Patient tolerated treatment well Patient left: in chair;with call bell/phone within reach;with chair alarm set Nurse Communication: Mobility status PT Visit Diagnosis: Dizziness and giddiness (R42)  Time: 9629-5284 PT Time Calculation (min) (ACUTE ONLY): 25 min   Charges:   PT Evaluation $PT Eval Low Complexity: 1 Low   PT General Charges $$ ACUTE PT VISIT: 1 Visit        Josephene Marrone PT, DPT 12/20/2023, 4:27 PM

## 2023-12-20 NOTE — Evaluation (Addendum)
Occupational Therapy Evaluation Patient Details Name: Amy Hutchinson MRN: [id number] DOB: December 01, 1961 Today's Date: 12/20/2023   History of Present Illness 63 y.o. female with PMHx: hyperlipidemia, depression with anxiety, GERD, tobacco abuse, chronic vertigo, resection for meningioma, rectovaginal fistula, allergy on Xolair injection, who presents with syncope. Normal brain MRI.   Clinical Impression   Pt was seen for PT/OT co-evaluation this date to maximize pt/therapist safety d/t multiple syncopal episodes recently. Prior to hospital admission, pt was living in a 2nd level apartment with her spouse who works during the day. Pt works from home, but was fully IND and community mobile, driving, etc. Denies any falls, only the 4-5 syncopal episodes at home that occurred once she sat down when she felt them coming on.  Pt presents to acute OT demonstrating no functional decline or impairment in ADL performance or functional transfers. She had minimal dizziness throughout session and demo IND with all tasks. Orthostatic vitals were monitored as follows in R arm: Orthostatic VS:  BP- Lying BP- Sitting BP- Standing at 0 minutes BP- Standing at 3 minutes BP-after walking 1 lap around nursing station in standing  12/20/23 1309 112/71 123/68 101/67 104/72 116/78    Pt has no acute OT needs at this time and was educated on proper positioning and safety techniques to minimize dizziness during ADL/IADL performance. OT to sign off in house.        If plan is discharge home, recommend the following:      Functional Status Assessment  Patient has not had a recent decline in their functional status  Equipment Recommendations       Recommendations for Other Services       Precautions / Restrictions Precautions Precautions: None Restrictions Weight Bearing Restrictions Per Provider Order: No      Mobility Bed Mobility Overal bed mobility: Modified Independent                   Transfers Overall transfer level: Modified independent                 General transfer comment: MOD I for bed mobility, STS and IND mobility in the hallway with no AD use      Balance Overall balance assessment: Independent                                         ADL either performed or assessed with clinical judgement   ADL Overall ADL's : Independent                                       General ADL Comments: able to don socks seated at EOB IND     Vision         Perception         Praxis         Pertinent Vitals/Pain Pain Assessment Pain Assessment: No/denies pain     Extremity/Trunk Assessment Upper Extremity Assessment Upper Extremity Assessment: Overall WFL for tasks assessed   Lower Extremity Assessment Lower Extremity Assessment: Overall WFL for tasks assessed       Communication Communication Communication: No apparent difficulties   Cognition Arousal: Alert Behavior During Therapy: WFL for tasks assessed/performed Overall Cognitive Status: Within Functional Limits for tasks assessed  General Comments  pt with minimal to no dizziness throughout evaluations    Exercises Other Exercises Other Exercises: Provided education on limited positional changes or doing them very slowly when she has to with pt verbalizing understanding to prevent dizziness.   Shoulder Instructions      Home Living Family/patient expects to be discharged to:: Private residence Living Arrangements: Spouse/significant other Available Help at Discharge: Family;Available PRN/intermittently (husband works during the day 4am-3pm) Type of Home: Apartment (2nd level) Home Access: Stairs to enter Entrance Stairs-Number of Steps: 1 flight 2 sets of 5-6 steps Entrance Stairs-Rails: Can reach both;Left;Right Home Layout: One level     Bathroom Shower/Tub: Scientist, research (life sciences): Standard     Home Equipment: Agricultural consultant (2 wheels)          Prior Functioning/Environment Prior Level of Function : Independent/Modified Independent;Driving;Working/employed             Mobility Comments: IND; no AD use; denies falls ADLs Comments: IND with ADL/IADLs; driving and community mobile; works from home        OT Problem List:        OT Treatment/Interventions:      OT Goals(Current goals can be found in the care plan section)    OT Frequency:      Co-evaluation PT/OT/SLP Co-Evaluation/Treatment: Yes Reason for Co-Treatment: For patient/therapist safety PT goals addressed during session: Mobility/safety with mobility OT goals addressed during session: ADL's and self-care      AM-PAC OT "6 Clicks" Daily Activity     Outcome Measure Help from another person eating meals?: None Help from another person taking care of personal grooming?: None Help from another person toileting, which includes using toliet, bedpan, or urinal?: None Help from another person bathing (including washing, rinsing, drying)?: None Help from another person to put on and taking off regular upper body clothing?: None Help from another person to put on and taking off regular lower body clothing?: None 6 Click Score: 24   End of Session Equipment Utilized During Treatment: Gait belt Nurse Communication: Mobility status  Activity Tolerance: Patient tolerated treatment well Patient left: in chair;with call bell/phone within reach;with chair alarm set  OT Visit Diagnosis: Dizziness and giddiness (R42)                Time: 1610-9604 OT Time Calculation (min): 35 min Charges:  OT General Charges $OT Visit: 1 Visit OT Evaluation $OT Eval Low Complexity: 1 Low OT Treatments $Self Care/Home Management : 8-22 mins [name], OTR/L 12/20/23, 1:17 PM  [name] [name] 12/20/2023, 1:13 PM

## 2024-01-06 ENCOUNTER — Telehealth: Payer: Self-pay

## 2024-01-06 NOTE — Telephone Encounter (Signed)
Which blood pressure medication are you taking?  Left message for patient to return call

## 2024-01-06 NOTE — Telephone Encounter (Signed)
Copied from CRM [id number]. Topic: General - Inquiry >> Jan 06, 2024 12:55 PM Amy Hutchinson wrote: Reason for CRM: Pt called with needing clarification on blood pressure medication she is prescribed.  [phone]

## 2024-01-06 NOTE — Telephone Encounter (Signed)
Copied from CRM [id number]. Topic: General - Inquiry >> Jan 06, 2024 12:55 PM Teressa P wrote: Reason for CRM: Pt called with needing clarification on blood pressure medication she is prescribed.  [phone]

## 2024-01-07 NOTE — Telephone Encounter (Signed)
 Spoke to patient she will discuss this at her appointment tomorrow

## 2024-01-08 ENCOUNTER — Ambulatory Visit: Payer: BC Managed Care – PPO | Admitting: Family Medicine

## 2024-01-08 ENCOUNTER — Encounter: Payer: Self-pay | Admitting: Family Medicine

## 2024-01-08 VITALS — BP 110/80 | HR 80 | Ht 64.0 in | Wt 167.0 lb

## 2024-01-08 DIAGNOSIS — Z23 Encounter for immunization: Secondary | ICD-10-CM

## 2024-01-08 DIAGNOSIS — R55 Syncope and collapse: Secondary | ICD-10-CM

## 2024-01-08 DIAGNOSIS — R42 Dizziness and giddiness: Secondary | ICD-10-CM | POA: Diagnosis not present

## 2024-01-08 MED ORDER — MECLIZINE HCL 25 MG PO TABS: 25.0000 mg | ORAL_TABLET | Freq: Three times a day (TID) | ORAL | 3 refills | Status: DC | PRN

## 2024-01-08 NOTE — Patient Instructions (Addendum)
 Thank you for coming to the office today.  ECHOcardiogram ordered  Do not take Midodrine  UNLESS significant low BP < 100 / 60 AND Dizzy lightheaded.  Okay with current BP  Refilled Meclizine   Caution with sudden standing turning movement.  You have symptoms of Vertigo (Benign Paroxysmal Positional Vertigo) - This is commonly caused by inner ear fluid imbalance, sometimes can be worsened by allergies and sinus symptoms, otherwise it can occur randomly sometimes and we may never discover the exact cause. - To treat this, try the Epley Manuever (see diagrams/instructions below) at home up to 3 times a day for 1-2 weeks or until symptoms resolve - You may take Meclizine  as needed up to 3 times a day for dizziness, this will not cure symptoms but may help. Caution may make you drowsy.  If you develop significant worsening episode with vertigo that does not improve and you get severe headache, loss of vision, arm or leg weakness, slurred speech, or other concerning symptoms please seek immediate medical attention at Emergency Department.  Please schedule a follow-up appointment with Dr Edman within 4 weeks if Vertigo not improving, and will consider Referral to Vestibular Rehab  See the next page for images describing the Epley Manuever.     ----------------------------------------------------------------------------------------------------------------------       Please schedule a Follow-up Appointment to: No follow-ups on file.  If you have any other questions or concerns, please feel free to call the office or send a message through MyChart. You may also schedule an earlier appointment if necessary.  Additionally, you may be receiving a survey about your experience at our office within a few days to 1 week by e-mail or mail. We value your feedback.  Marsa Edman, DO [facility], [place]

## 2024-01-08 NOTE — Progress Notes (Signed)
 Subjective:    Patient ID: Amy Hutchinson, female    DOB: 1961-05-08, 63 y.o.   MRN: [id number]  Amy Hutchinson is a 63 y.o. female presenting on 01/08/2024 for Loss of Consciousness (/)   HPI  Discussed the use of AI scribe software for clinical note transcription with the patient, who gave verbal consent to proceed.  History of Present Illness     HOSPITAL FOLLOW-UP VISIT  Hospital/Location: [facility] Date of Admission: 12/19/23 Date of Discharge: 12/20/23 Transitions of care telephone call: Not completed  Reason for Admission: Syncope  - Hospital H&P and Discharge Summary have been reviewed - Patient presents today 3 weeks after recent hospitalization. Brief summary of recent course, patient had symptoms of syncopal event, dizziness, orthostatic hypotension.  She was recently hospitalized due to episodes of dizziness and fainting. During her hospital stay, orthostatic hypotension was identified as a potential cause of her symptoms, with a significant drop in blood pressure upon standing. An MRI was performed, but an echocardiogram was not completed before discharge.  At home, her blood pressure readings have been around 137/75 to 137/78, which she notes is higher than her usual low readings. She has not taken midodrine , a medication prescribed to raise blood pressure, since discharge, as she considers it an 'emergency pill' and prefers not to use it unless absolutely necessary.  She continues to experience occasional dizziness, for which meclizine  has been helpful. She requests a refill of meclizine , which she takes as needed when feeling dizzy or nauseous.  She does not consume caffeine but drinks caffeine-free soda and decaf coffee. She mentions being anxious about her blood pressure readings being higher than usual.  - New medications on discharge: Midodrine , Meclizine  - Changes to current meds on discharge: none  I have reviewed the discharge medication list, and have reconciled the  current and discharge medications today.   Current Outpatient Medications:    alprazolam  (XANAX ) 2 MG tablet, Take 0.5 tablets (1 mg total) by mouth 4 (four) times daily as needed for sleep or anxiety., Disp: 60 tablet, Rfl: 0   citalopram  (CELEXA ) 40 MG tablet, Take 1 tablet (40 mg total) by mouth daily., Disp: 30 tablet, Rfl:    EPINEPHrine  0.3 mg/0.3 mL IJ SOAJ injection, Inject 0.3 mg into the muscle as needed for anaphylaxis., Disp: 1 each, Rfl: 2   midodrine  (PROAMATINE ) 5 MG tablet, Take 1 tablet (5 mg total) by mouth 3 (three) times daily as needed (if Systolic BP <120 and sympotomatic/Dizzy (may take an other 5 mg if persistant low BP and symptomatic))., Disp: 90 tablet, Rfl: 2   montelukast  (SINGULAIR ) 10 MG tablet, Take 1 tablet (10 mg total) by mouth at bedtime., Disp: 30 tablet, Rfl: 4   QUEtiapine  (SEROQUEL ) 100 MG tablet, Take 1 tablet (100 mg total) by mouth at bedtime., Disp: 30 tablet, Rfl: 2   rosuvastatin  (CRESTOR ) 10 MG tablet, Take 1 tablet (10 mg total) by mouth at bedtime., Disp: 90 tablet, Rfl: 3   meclizine  (ANTIVERT ) 25 MG tablet, Take 1 tablet (25 mg total) by mouth 3 (three) times daily as needed for dizziness., Disp: 30 tablet, Rfl: 3   ondansetron  (ZOFRAN -ODT) 4 MG disintegrating tablet, Take 1 tablet (4 mg total) by mouth every 8 (eight) hours as needed. (Patient not taking: Reported on 01/08/2024), Disp: 20 tablet, Rfl: 0  Current Facility-Administered Medications:    omalizumab  (XOLAIR ) prefilled syringe 300 mg, 300 mg, Subcutaneous, Q28 days, Jeneal Danita Macintosh, MD, 300 mg at 09/25/23 1523  -------------------------------------------------------------------------  Social History   Tobacco Use   Smoking status: Every Day    Current packs/day: 0.40    Average packs/day: 0.4 packs/day for 32.0 years (12.8 ttl pk-yrs)    Types: Cigarettes   Smokeless tobacco: Never  Vaping Use   Vaping status: Never Used  Substance Use Topics   Alcohol use: Not  Currently   Drug use: Never    Review of Systems Per HPI unless specifically indicated above     Objective:    BP 110/80   Pulse 80   Ht 5' 4 (1.626 m)   Wt 167 lb (75.8 kg)   SpO2 98%   BMI 28.67 kg/m   Wt Readings from Last 3 Encounters:  01/08/24 167 lb (75.8 kg)  12/20/23 164 lb (74.4 kg)  07/31/23 164 lb 14.4 oz (74.8 kg)    Physical Exam Vitals and nursing note reviewed.  Constitutional:      General: She is not in acute distress.    Appearance: She is well-developed. She is not diaphoretic.     Comments: Well-appearing, comfortable, cooperative  HENT:     Head: Normocephalic and atraumatic.  Eyes:     General:        Right eye: No discharge.        Left eye: No discharge.     Conjunctiva/sclera: Conjunctivae normal.  Neck:     Thyroid : No thyromegaly.  Cardiovascular:     Rate and Rhythm: Normal rate and regular rhythm.     Heart sounds: Normal heart sounds. No murmur heard. Pulmonary:     Effort: Pulmonary effort is normal. No respiratory distress.     Breath sounds: Normal breath sounds. No wheezing or rales.  Musculoskeletal:        General: Normal range of motion.     Cervical back: Normal range of motion and neck supple.  Lymphadenopathy:     Cervical: No cervical adenopathy.  Skin:    General: Skin is warm and dry.     Findings: No erythema or rash.  Neurological:     Mental Status: She is alert and oriented to person, place, and time.  Psychiatric:        Behavior: Behavior normal.     Comments: Well groomed, good eye contact, normal speech and thoughts     I have personally reviewed the radiology report from 12/19/23 on MRI Brain.  CLINICAL DATA:  Recurrent syncope   EXAM: MRI HEAD WITHOUT CONTRAST   TECHNIQUE: Multiplanar, multiecho pulse sequences of the brain and surrounding structures were obtained without intravenous contrast.   COMPARISON:  None Available.   FINDINGS: Brain: No acute infarct, mass effect or extra-axial  collection. No acute or chronic hemorrhage. Normal white matter signal, parenchymal volume and CSF spaces. The midline structures are normal.   Vascular: Normal flow voids.   Skull and upper cervical spine: Normal calvarium and skull base. Visualized upper cervical spine and soft tissues are normal.   Sinuses/Orbits:No paranasal sinus fluid levels or advanced mucosal thickening. No mastoid or middle ear effusion. Normal orbits.   IMPRESSION: Normal brain MRI.     Electronically Signed   By: Franky Stanford M.D.   On: 12/19/2023 23:40 Results for orders placed or performed during the hospital encounter of 12/19/23  CBC   Collection Time: 12/19/23 12:47 PM  Result Value Ref Range   WBC 10.3 4.0 - 10.5 K/uL   RBC 4.74 3.87 - 5.11 MIL/uL   Hemoglobin 14.0 12.0 - 15.0 g/dL  HCT 43.4 36.0 - 46.0 %   MCV 91.6 80.0 - 100.0 fL   MCH 29.5 26.0 - 34.0 pg   MCHC 32.3 30.0 - 36.0 g/dL   RDW 85.8 88.4 - 84.4 %   Platelets 316 150 - 400 K/uL   nRBC 0.0 0.0 - 0.2 %  Urinalysis, Routine w reflex microscopic -Urine, Clean Catch   Collection Time: 12/19/23 12:47 PM  Result Value Ref Range   Color, Urine YELLOW (A) YELLOW   APPearance HAZY (A) CLEAR   Specific Gravity, Urine 1.008 1.005 - 1.030   pH 6.0 5.0 - 8.0   Glucose, UA NEGATIVE NEGATIVE mg/dL   Hgb urine dipstick SMALL (A) NEGATIVE   Bilirubin Urine NEGATIVE NEGATIVE   Ketones, ur NEGATIVE NEGATIVE mg/dL   Protein, ur NEGATIVE NEGATIVE mg/dL   Nitrite NEGATIVE NEGATIVE   Leukocytes,Ua TRACE (A) NEGATIVE   RBC / HPF 0-5 0 - 5 RBC/hpf   WBC, UA 0-5 0 - 5 WBC/hpf   Bacteria, UA MANY (A) NONE SEEN   Squamous Epithelial / HPF 0-5 0 - 5 /HPF   Mucus PRESENT   Comprehensive metabolic panel   Collection Time: 12/19/23 12:47 PM  Result Value Ref Range   Sodium 138 135 - 145 mmol/L   Potassium 4.2 3.5 - 5.1 mmol/L   Chloride 107 98 - 111 mmol/L   CO2 22 22 - 32 mmol/L   Glucose, Bld 110 (H) 70 - 99 mg/dL   BUN 15 6 - 20 mg/dL    Creatinine, Ser 9.19 0.44 - 1.00 mg/dL   Calcium  9.3 8.9 - 10.3 mg/dL   Total Protein 6.9 6.5 - 8.1 g/dL   Albumin 4.1 3.5 - 5.0 g/dL   AST 17 15 - 41 U/L   ALT 14 0 - 44 U/L   Alkaline Phosphatase 63 38 - 126 U/L   Total Bilirubin 0.6 0.0 - 1.2 mg/dL   GFR, Estimated >39 >39 mL/min   Anion gap 9 5 - 15  Troponin I (High Sensitivity)   Collection Time: 12/19/23 12:47 PM  Result Value Ref Range   Troponin I (High Sensitivity) 3 <18 ng/L  Troponin I (High Sensitivity)   Collection Time: 12/19/23  5:35 PM  Result Value Ref Range   Troponin I (High Sensitivity) 2 <18 ng/L  CBG monitoring, ED   Collection Time: 12/19/23  5:49 PM  Result Value Ref Range   Glucose-Capillary 93 70 - 99 mg/dL  HIV Antibody (routine testing w rflx)   Collection Time: 12/19/23  6:40 PM  Result Value Ref Range   HIV Screen 4th Generation wRfx Non Reactive Non Reactive  Urine Drug Screen, Qualitative ([facility] only)   Collection Time: 12/19/23  8:01 PM  Result Value Ref Range   Tricyclic, Ur Screen POSITIVE (A) NONE DETECTED   Amphetamines, Ur Screen NONE DETECTED NONE DETECTED   MDMA (Ecstasy)Ur Screen NONE DETECTED NONE DETECTED   Cocaine Metabolite,Ur [place] NONE DETECTED NONE DETECTED   Opiate, Ur Screen NONE DETECTED NONE DETECTED   Phencyclidine (PCP) Ur S NONE DETECTED NONE DETECTED   Cannabinoid 50 Ng, Ur [place] NONE DETECTED NONE DETECTED   Barbiturates, Ur Screen NONE DETECTED NONE DETECTED   Benzodiazepine, Ur Scrn POSITIVE (A) NONE DETECTED   Methadone Scn, Ur NONE DETECTED NONE DETECTED      Assessment & Plan:   Problem List Items Addressed This Visit     Syncope - Primary   Relevant Orders   ECHOCARDIOGRAM COMPLETE   Other Visit  Diagnoses       Dizziness       Relevant Medications   meclizine  (ANTIVERT ) 25 MG tablet     Flu vaccine need       Relevant Orders   Flu vaccine trivalent PF, 6mos and older(Flulaval,Afluria,Fluarix,Fluzone ) (Completed)      Orthostatic Hypotension Recent  hospitalization for dizziness and fainting. Blood pressure drops significantly upon standing. Midodrine  prescribed as needed for significant low blood pressure, but patient has not needed to take it. -Continue to monitor blood pressure at home. Seems BP stabilized -Take Midodrine  only if blood pressure drops below 100/60 and patient experiences dizziness or lightheadedness. -Increase salt intake and maintain hydration to help manage blood pressure. -Cautions with sudden standing movement  Vertigo Patient experiences occasional dizziness. Meclizine  has been helpful. -Refill Meclizine  prescription. -Continue vertigo exercises as needed. Epley  Pending Echocardiogram Recommended during recent hospitalization but not completed. -Order Echocardiogram as requested  Follow-up No immediate concerns. Patient to reach out as needed.        Meds ordered this encounter  Medications   meclizine  (ANTIVERT ) 25 MG tablet    Sig: Take 1 tablet (25 mg total) by mouth 3 (three) times daily as needed for dizziness.    Dispense:  30 tablet    Refill:  3    Follow up plan: Return if symptoms worsen or fail to improve.   Marsa Officer, DO [facility] 01/08/2024, 9:52 AM

## 2024-01-28 ENCOUNTER — Ambulatory Visit: Payer: BC Managed Care – PPO | Attending: Family Medicine

## 2024-01-28 DIAGNOSIS — R55 Syncope and collapse: Secondary | ICD-10-CM

## 2024-01-28 LAB — ECHOCARDIOGRAM COMPLETE
AV Mean grad: 4 mmHg
AV Peak grad: 6.8 mmHg
Ao pk vel: 1.3 m/s
Area-P 1/2: 3.08 cm2
S' Lateral: 3.1 cm

## 2024-01-29 ENCOUNTER — Encounter: Payer: Self-pay | Admitting: Family Medicine

## 2024-02-19 DIAGNOSIS — F3340 Major depressive disorder, recurrent, in remission, unspecified: Secondary | ICD-10-CM | POA: Diagnosis not present

## 2024-02-19 DIAGNOSIS — F410 Panic disorder [episodic paroxysmal anxiety] without agoraphobia: Secondary | ICD-10-CM | POA: Diagnosis not present

## 2024-02-19 DIAGNOSIS — F411 Generalized anxiety disorder: Secondary | ICD-10-CM | POA: Diagnosis not present

## 2024-02-23 ENCOUNTER — Ambulatory Visit: Admitting: Family Medicine

## 2024-02-27 ENCOUNTER — Other Ambulatory Visit: Payer: Self-pay

## 2024-02-27 ENCOUNTER — Ambulatory Visit: Admitting: Family Medicine

## 2024-02-27 ENCOUNTER — Encounter: Payer: Self-pay | Admitting: Family Medicine

## 2024-02-27 VITALS — BP 100/52 | HR 88 | Temp 97.7°F | Resp 16

## 2024-02-27 DIAGNOSIS — B999 Unspecified infectious disease: Secondary | ICD-10-CM | POA: Diagnosis not present

## 2024-02-27 DIAGNOSIS — J310 Chronic rhinitis: Secondary | ICD-10-CM | POA: Diagnosis not present

## 2024-02-27 DIAGNOSIS — L501 Idiopathic urticaria: Secondary | ICD-10-CM

## 2024-02-27 DIAGNOSIS — H6993 Unspecified Eustachian tube disorder, bilateral: Secondary | ICD-10-CM

## 2024-02-27 DIAGNOSIS — L299 Pruritus, unspecified: Secondary | ICD-10-CM

## 2024-02-27 MED ORDER — MONTELUKAST SODIUM 10 MG PO TABS: 10.0000 mg | ORAL_TABLET | Freq: Every day | ORAL | 4 refills | Status: DC

## 2024-02-27 MED ORDER — EPINEPHRINE 0.3 MG/0.3ML IJ SOAJ: 0.3000 mg | INTRAMUSCULAR | 2 refills | Status: AC | PRN

## 2024-02-27 NOTE — Patient Instructions (Addendum)
 Hives (urticaria)/itching In the morning take Zyrtec (cetirizine) 20 mg + Pepcid (famotidine) 20 mg In the evening  take Zyrtec (cetirizine) 20 mg + Pepcid (famotidine) 20 mg + montelukast Remember to rotate to a different antihistamine about every 3 months. Some examples of over the counter antihistamines include Zyrtec (cetirizine), Xyzal (levocetirizine), Allegra (fexofenadine), and Claritin (loratidine).   May use Benadryl (diphenhydramine) as needed for breakthrough hives For now, continue Xolair injections 300 mg once every 28 days and have access to an epinephrine autoinjector set. We will reach out to Tammy to see if you can increase Xolair to once every 2 weeks Keep a detailed symptom journal including foods eaten, contact with allergens, medications taken, weather changes.    Chronic rhinitis Consider nasal saline rinses for any nasal symptoms Consider nasal saline gel for dry nostrils  Recurrent infection Keep track of infections, antibiotic use, and steroid use  Eustachian tube dysfunction/right ear pressure Follow-up with ENT specialty if needed  Call the clinic if this treatment plan is not working well for you.  Follow up in 6 months or sooner if needed.

## 2024-02-27 NOTE — Progress Notes (Signed)
 [address] [place] [address] Dept: [phone]  FOLLOW UP NOTE  Patient ID: Amy Hutchinson, female    DOB: Mar 08, 1961  Age: 63 y.o. MRN: [id number] Date of Office Visit: 02/27/2024  Assessment  Chief Complaint: Follow-up (She experiencing itchiness but no other symptoms.)  HPI Amy Hutchinson is a 63 year old female who presents to the clinic for follow-up visit.  She was last seen in this clinic on 07/11/2023 by Dr. Delorse Lek for evaluation of pruritus, urticaria, recurrent infection, facial swelling, and ear pressure.  In the interim, she was admitted to the emergency department for syncope on 12/19/2023.  She is accompanied by her husband who assists with history.   At today's visit, she reports that she continues to experience pruritus which has improved somewhat since her last visit.  She reports this pruritus has been ongoing over the last several years with little relief of symptoms.  She denies new foods, personal care products, new medications, or recent illness.  She reports that her husband is not experiencing any symptoms including pruritus or rash.  She continues Allegra 2 tablets in the morning  and 2 tablets in the evening, famotidine 20 mg in the morning and 20 mg in the evening, and montelukast 10 mg once a day.  She continues Xolair injections once every 4 weeks with no large or local reactions.  She reports some relief after receiving Xolair, however, the relief is not lasting a full 4 weeks until her next Xolair injection.  Epinephrine autoinjector set expires today and will be reordered at today's visit.  Chronic urticaria workup indicated positive chronic urticaria marker.  Chronic rhinitis is reported as moderately well-controlled with occasional nasal congestion as the main symptom.  She reports that she occasionally uses nasal saline rinses and is not currently using Flonase.  She does report some nasal bleeding after using Flonase in the past.  Her last environmental  allergy testing via lab was on 02/06/2023 and was negative to the environmental panel.  She continues to experience fullness in both ears.  She denies ear pain or drainage from either ear.  She denies infections requiring antibiotics since her last visit to this clinic.  Her current medications are listed in the chart.  Drug Allergies:  Allergies  Allergen Reactions   Codeine Nausea And Vomiting   Darvon [Propoxyphene] Nausea And Vomiting   Dilaudid [Hydromorphone] Nausea And Vomiting   Hydrocodone Nausea And Vomiting   Percocet [Oxycodone-Acetaminophen] Nausea And Vomiting    Physical Exam: BP (!) 100/52 (BP Location: Right Arm, Patient Position: Sitting, Cuff Size: Normal)   Pulse 88   Temp 97.7 F (36.5 C) (Temporal)   Resp 16   SpO2 96%    Physical Exam Vitals reviewed.  Constitutional:      Appearance: Normal appearance.  HENT:     Head: Normocephalic and atraumatic.     Right Ear: Tympanic membrane normal.     Left Ear: Tympanic membrane normal.     Nose:     Comments: Bilateral nares slightly erythematous with thin clear nasal drainage noted.  Pharynx normal.  Ears normal.  Eyes normal.    Mouth/Throat:     Pharynx: Oropharynx is clear.  Eyes:     Conjunctiva/sclera: Conjunctivae normal.  Cardiovascular:     Rate and Rhythm: Normal rate and regular rhythm.     Heart sounds: Normal heart sounds. No murmur heard. Pulmonary:     Effort: Pulmonary effort is normal.     Breath sounds: Normal  breath sounds.     Comments: Lungs clear to auscultation Musculoskeletal:        General: Normal range of motion.     Cervical back: Normal range of motion and neck supple.  Skin:    General: Skin is warm and dry.  Neurological:     Mental Status: She is alert and oriented to person, place, and time.  Psychiatric:        Mood and Affect: Mood normal.        Behavior: Behavior normal.        Thought Content: Thought content normal.        Judgment: Judgment normal.       Assessment and Plan: 1. Idiopathic urticaria   2. Pruritus   3. Recurrent infections   4. Chronic rhinitis   5. Eustachian tube dysfunction, bilateral     Meds ordered this encounter  Medications   montelukast (SINGULAIR) 10 MG tablet    Sig: Take 1 tablet (10 mg total) by mouth at bedtime.    Dispense:  30 tablet    Refill:  4   EPINEPHrine 0.3 mg/0.3 mL IJ SOAJ injection    Sig: Inject 0.3 mg into the muscle as needed for anaphylaxis.    Dispense:  1 each    Refill:  2    Patient Instructions  Hives (urticaria)/itching In the morning take Zyrtec (cetirizine) 20 mg + Pepcid (famotidine) 20 mg In the evening  take Zyrtec (cetirizine) 20 mg + Pepcid (famotidine) 20 mg + montelukast Remember to rotate to a different antihistamine about every 3 months. Some examples of over the counter antihistamines include Zyrtec (cetirizine), Xyzal (levocetirizine), Allegra (fexofenadine), and Claritin (loratidine).   May use Benadryl (diphenhydramine) as needed for breakthrough hives For now, continue Xolair injections 300 mg once every 28 days and have access to an epinephrine autoinjector set. We will reach out to Tammy to see if you can increase Xolair to once every 2 weeks Keep a detailed symptom journal including foods eaten, contact with allergens, medications taken, weather changes.    Chronic rhinitis Consider nasal saline rinses for any nasal symptoms Consider nasal saline gel for dry nostrils  Recurrent infection Keep track of infections, antibiotic use, and steroid use  Eustachian tube dysfunction/right ear pressure Follow-up with ENT specialty if needed  Call the clinic if this treatment plan is not working well for you.  Follow up in 6 months or sooner if needed.   Return in about 6 months (around 08/29/2024), or if symptoms worsen or fail to improve.    Thank you for the opportunity to care for this patient.  Please do not hesitate to contact me with questions.  Thermon Leyland, FNP [facility]

## 2024-03-03 ENCOUNTER — Telehealth: Payer: Self-pay | Admitting: *Deleted

## 2024-03-03 ENCOUNTER — Telehealth: Payer: Self-pay | Admitting: Family Medicine

## 2024-03-03 MED ORDER — XOLAIR 300 MG/2ML ~~LOC~~ SOSY: 300.0000 mg | PREFILLED_SYRINGE | SUBCUTANEOUS | 11 refills | Status: AC

## 2024-03-03 NOTE — Telephone Encounter (Signed)
 Patient advise with next ship new dosing

## 2024-03-03 NOTE — Telephone Encounter (Signed)
-----   Message from Thermon Leyland sent at 02/27/2024  4:02 PM EDT ----- Hi there Amy Hutchinson Can you please try to increase this patient's Xolair for urticaria from Q4 to Q2. She is getting some relief but wears off before the next injection. Thank you

## 2024-03-03 NOTE — Telephone Encounter (Signed)
 Pt request a call back about being tested.

## 2024-03-03 NOTE — Telephone Encounter (Signed)
 L/m for patient to contact me to advise approval and submit for new dosing 300mg  syr every 14 days to [facility]

## 2024-03-04 NOTE — Telephone Encounter (Signed)
 Called and left a voicemail asking for a return call to discuss.  ?

## 2024-03-04 NOTE — Telephone Encounter (Signed)
 Called and spoke with patient. She was wondering if she needed to do any skin testing to see what may cause the itching just to make sure it rules everything out. I did advise that an environmental panel was done last year thru blood work and reassured that everything was negative. Patient verbalized understanding and will call back if she needs anything.

## 2024-05-19 DIAGNOSIS — F3340 Major depressive disorder, recurrent, in remission, unspecified: Secondary | ICD-10-CM | POA: Diagnosis not present

## 2024-05-19 DIAGNOSIS — F410 Panic disorder [episodic paroxysmal anxiety] without agoraphobia: Secondary | ICD-10-CM | POA: Diagnosis not present

## 2024-05-19 DIAGNOSIS — F411 Generalized anxiety disorder: Secondary | ICD-10-CM | POA: Diagnosis not present

## 2024-05-31 ENCOUNTER — Other Ambulatory Visit: Payer: Self-pay | Admitting: Family Medicine

## 2024-05-31 DIAGNOSIS — E782 Mixed hyperlipidemia: Secondary | ICD-10-CM

## 2024-06-02 NOTE — Telephone Encounter (Signed)
 Requested medication (s) are due for refill today:   Yes  Requested medication (s) are on the active medication list:   Yes  Future visit scheduled:   No    LOV 01/08/2024   Last ordered: 06/02/2023 #90, 3 refills  Unable to refill because lipid panel due per protocol.    Requested Prescriptions  Pending Prescriptions Disp Refills   rosuvastatin  (CRESTOR ) 10 MG tablet [Pharmacy Med Name: Rosuvastatin  Calcium  10 MG Oral Tablet] 90 tablet 0    Sig: TAKE 1 TABLET BY MOUTH AT BEDTIME     Cardiovascular:  Antilipid - Statins 2 Failed - 06/02/2024 10:21 AM      Failed - Lipid Panel in normal range within the last 12 months    Cholesterol  Date Value Ref Range Status  03/18/2022 174 <200 mg/dL Final   LDL Cholesterol (Calc)  Date Value Ref Range Status  03/18/2022 104 (H) mg/dL (calc) Final    Comment:    Reference range: <100 . Desirable range <100 mg/dL for primary prevention;   <70 mg/dL for patients with CHD or diabetic patients  with > or = 2 CHD risk factors. SABRA LDL-C is now calculated using the Martin-Hopkins  calculation, which is a validated novel method providing  better accuracy than the Friedewald equation in the  estimation of LDL-C.  Gladis APPLETHWAITE et al. SANDREA. 7986;689(80): 2061-2068  (http://education.[facility].com/faq/FAQ164)    HDL  Date Value Ref Range Status  03/18/2022 40 (L) > OR = 50 mg/dL Final   Triglycerides  Date Value Ref Range Status  03/18/2022 181 (H) <150 mg/dL Final         Passed - Cr in normal range and within 360 days    Creat  Date Value Ref Range Status  03/18/2022 0.83 0.50 - 1.03 mg/dL Final   Creatinine, Ser  Date Value Ref Range Status  12/19/2023 0.80 0.44 - 1.00 mg/dL Final         Passed - Patient is not pregnant      Passed - Valid encounter within last 12 months    Recent Outpatient Visits           4 months ago Syncope, unspecified syncope type   [facility] [place], [place], [place]

## 2024-06-30 ENCOUNTER — Other Ambulatory Visit: Payer: Self-pay | Admitting: *Deleted

## 2024-06-30 MED ORDER — MONTELUKAST SODIUM 10 MG PO TABS: 10.0000 mg | ORAL_TABLET | Freq: Every day | ORAL | 1 refills | Status: DC

## 2024-08-20 DIAGNOSIS — F3340 Major depressive disorder, recurrent, in remission, unspecified: Secondary | ICD-10-CM | POA: Diagnosis not present

## 2024-08-20 DIAGNOSIS — F410 Panic disorder [episodic paroxysmal anxiety] without agoraphobia: Secondary | ICD-10-CM | POA: Diagnosis not present

## 2024-08-20 DIAGNOSIS — F411 Generalized anxiety disorder: Secondary | ICD-10-CM | POA: Diagnosis not present

## 2024-08-27 ENCOUNTER — Other Ambulatory Visit: Payer: Self-pay | Admitting: Family Medicine

## 2024-08-27 DIAGNOSIS — E782 Mixed hyperlipidemia: Secondary | ICD-10-CM

## 2024-08-30 NOTE — Telephone Encounter (Signed)
 Requested medication (s) are due for refill today: yes  Requested medication (s) are on the active medication list: yes  Last refill:  06/02/24  Future visit scheduled: no  Notes to clinic:  Unable to refill per protocol due to failed labs, no updated lipid results.      Requested Prescriptions  Pending Prescriptions Disp Refills   rosuvastatin  (CRESTOR ) 10 MG tablet [Pharmacy Med Name: ROSUVASTATIN  10MG  TAB] 90 tablet 0    Sig: TAKE 1 TABLET BY MOUTH AT BEDTIME     Cardiovascular:  Antilipid - Statins 2 Failed - 08/30/2024  8:36 AM      Failed - Lipid Panel in normal range within the last 12 months    Cholesterol  Date Value Ref Range Status  03/18/2022 174 <200 mg/dL Final   LDL Cholesterol (Calc)  Date Value Ref Range Status  03/18/2022 104 (H) mg/dL (calc) Final    Comment:    Reference range: <100 . Desirable range <100 mg/dL for primary prevention;   <70 mg/dL for patients with CHD or diabetic patients  with > or = 2 CHD risk factors. SABRA LDL-C is now calculated using the Martin-Hopkins  calculation, which is a validated novel method providing  better accuracy than the Friedewald equation in the  estimation of LDL-C.  Gladis APPLETHWAITE et al. SANDREA. 7986;689(80): 2061-2068  (http://education.[facility].com/faq/FAQ164)    HDL  Date Value Ref Range Status  03/18/2022 40 (L) > OR = 50 mg/dL Final   Triglycerides  Date Value Ref Range Status  03/18/2022 181 (H) <150 mg/dL Final         Passed - Cr in normal range and within 360 days    Creat  Date Value Ref Range Status  03/18/2022 0.83 0.50 - 1.03 mg/dL Final   Creatinine, Ser  Date Value Ref Range Status  12/19/2023 0.80 0.44 - 1.00 mg/dL Final         Passed - Patient is not pregnant      Passed - Valid encounter within last 12 months    Recent Outpatient Visits           7 months ago Syncope, unspecified syncope type   [facility] [place], [place], [place]

## 2024-08-31 ENCOUNTER — Ambulatory Visit: Payer: Self-pay

## 2024-08-31 NOTE — Telephone Encounter (Signed)
 FYI Only or Action Required?: FYI only for provider.  Patient was last seen in primary care on 01/08/2024 by Edman Marsa PARAS, DO.  Called Nurse Triage reporting Dizziness.  Symptoms began several weeks ago.  Interventions attempted: Other: slow position changes.  Symptoms are: unchanged.  Triage Disposition: See PCP When Office is Open (Within 3 Days)  Patient/caregiver understands and will follow disposition?: Yes   Copied from CRM #[id number]. Topic: Clinical - Red Word Triage >> Aug 31, 2024  3:40 PM Larissa S wrote: [facility] that prompted transfer to Nurse Triage: dizziness and fainting   ----------------------------------------------------------------------- From previous Reason for Contact - Scheduling: Patient/patient representative is calling to schedule an appointment. Refer to attachments for appointment information. Reason for Disposition  [1] MODERATE dizziness (e.g., interferes with normal activities) AND [2] has been evaluated by doctor (or NP/PA) for this  Answer Assessment - Initial Assessment Questions 1. DESCRIPTION: Describe your dizziness.     lightheaded 2. LIGHTHEADED: Do you feel lightheaded? (e.g., somewhat faint, woozy, weak upon standing)     Feels faint, has not fallen 3. VERTIGO: Do you feel like either you or the room is spinning or tilting? (i.e., vertigo)     Occasional room spinning 4. SEVERITY: How bad is it?  Do you feel like you are going to faint? Can you stand and walk?     Able to walk  5. ONSET:  When did the dizziness begin?     2 weeks ago  6. AGGRAVATING FACTORS: Does anything make it worse? (e.g., standing, change in head position)     Nothing  7. HEART RATE: Can you tell me your heart rate? How many beats in 15 seconds?  (Note: Not all patients can do this.)       Feels normal  8. CAUSE: What do you think is causing the dizziness? (e.g., decreased fluids or food, diarrhea, emotional distress, heat  exposure, new medicine, sudden standing, vomiting; unknown)     unsure 9. RECURRENT SYMPTOM: Have you had dizziness before? If Yes, ask: When was the last time? What happened that time?     February 10. OTHER SYMPTOMS: Do you have any other symptoms? (e.g., fever, chest pain, vomiting, diarrhea, bleeding)       Ear pain. Occasional sides of hands get bright red. She is worried about poor circulation. Reports intermittent tingling in 3 toes that has been ongoing for a long time, worse when walking long distances or on feet a lot.  11. PREGNANCY: Is there any chance you are pregnant? When was your last menstrual period?  Protocols used: Dizziness - Lightheadedness-A-AH

## 2024-09-01 ENCOUNTER — Encounter: Payer: Self-pay | Admitting: Family Medicine

## 2024-09-01 ENCOUNTER — Other Ambulatory Visit: Payer: Self-pay | Admitting: Family Medicine

## 2024-09-01 ENCOUNTER — Ambulatory Visit: Admitting: Family Medicine

## 2024-09-01 VITALS — BP 110/60 | HR 92 | Ht 64.0 in | Wt 167.1 lb

## 2024-09-01 DIAGNOSIS — I951 Orthostatic hypotension: Secondary | ICD-10-CM | POA: Insufficient documentation

## 2024-09-01 DIAGNOSIS — R42 Dizziness and giddiness: Secondary | ICD-10-CM

## 2024-09-01 DIAGNOSIS — E782 Mixed hyperlipidemia: Secondary | ICD-10-CM | POA: Diagnosis not present

## 2024-09-01 DIAGNOSIS — R55 Syncope and collapse: Secondary | ICD-10-CM

## 2024-09-01 DIAGNOSIS — Z0000 Encounter for general adult medical examination without abnormal findings: Secondary | ICD-10-CM

## 2024-09-01 DIAGNOSIS — R7303 Prediabetes: Secondary | ICD-10-CM

## 2024-09-01 MED ORDER — MIDODRINE HCL 5 MG PO TABS: 5.0000 mg | ORAL_TABLET | Freq: Two times a day (BID) | ORAL | 0 refills | Status: DC

## 2024-09-01 NOTE — Progress Notes (Signed)
 Subjective:    Patient ID: Amy Hutchinson, female    DOB: [birth year], 63 y.o.   MRN: [id number]  Amy Hutchinson is a 63 y.o. female presenting on 09/01/2024 for Dizziness   HPI  Discussed the use of AI scribe software for clinical note transcription with the patient, who gave verbal consent to proceed.  History of Present Illness   Amy Hutchinson is a 63 year old female who presents with persistent dizziness and vertigo symptoms.  Vertigo and dizziness - Persistent dizziness and vertigo since February 2025 hospitalization - Symptoms often accompanied by headaches and a sensation of the room spinning - Symptoms most pronounced in the mornings after showering - No syncope, but experiences near-fainting and needs to sit down to prevent fainting - Meclizine  provides partial relief - Performs home positional maneuvers such as the Epley maneuver  Orthostatic hypotension - Hospitalized in February 2025 for dizziness - Diagnosed with orthostatic drops in blood pressure - Significant dizziness upon standing, raising concerns about potential fainting, especially when alone at home - Prescribed medication for blood pressure drops but has not taken it due to normal home blood pressure readings (typically around 110/?) - Monitors blood pressure at home, especially with position changes  Peripheral vascular and neurologic symptoms - Intermittent red and white discoloration of hands and legs, possibly related to poor circulation - Numbness and burning in the last three toes of one foot, attributed to a prior toe fracture  Barriers to diagnostic evaluation - Echocardiogram ordered in February 2025 was not completed due to insurance issues - Other diagnostic tests, including MRIs, have been canceled due to high out-of-pocket costs despite insurance coverage - Expresses frustration with insurance coverage and financial burden of medical testing         09/01/2024    3:20 PM 01/08/2024    9:17 AM  03/28/2023   11:00 AM  Depression screen PHQ 2/9  Decreased Interest 0 0 0  Down, Depressed, Hopeless 0 0 0  PHQ - 2 Score 0 0 0  Altered sleeping   0  Tired, decreased energy   0  Change in appetite   0  Feeling bad or failure about yourself    0  Trouble concentrating   0  Moving slowly or fidgety/restless   0  Suicidal thoughts   0  PHQ-9 Score   0  Difficult doing work/chores   Not difficult at all       09/01/2024    3:20 PM 01/08/2024    9:17 AM 03/28/2023   11:00 AM 09/12/2022   11:11 AM  GAD 7 : Generalized Anxiety Score  Nervous, Anxious, on Edge 0 0 0 0  Control/stop worrying 0 0 0 0  Worry too much - different things 0 0 0 0  Trouble relaxing 0 0 0 0  Restless 0 0 0 0  Easily annoyed or irritable 0 0 0 0  Afraid - awful might happen 0 0 0 0  Total GAD 7 Score 0 0 0 0  Anxiety Difficulty  Not difficult at all Not difficult at all Not difficult at all    Social History   Tobacco Use   Smoking status: Every Day    Current packs/day: 0.40    Average packs/day: 0.4 packs/day for 32.0 years (12.8 ttl pk-yrs)    Types: Cigarettes   Smokeless tobacco: Never  Vaping Use   Vaping status: Never Used  Substance Use Topics   Alcohol use: Not Currently   Drug  use: Never    Review of Systems Per HPI unless specifically indicated above     Objective:    BP 110/60 (BP Location: Left Arm, Patient Position: Sitting, Cuff Size: Normal)   Pulse 92   Ht 5' 4 (1.626 m)   Wt 167 lb 2 oz (75.8 kg)   SpO2 97%   BMI 28.69 kg/m   Wt Readings from Last 3 Encounters:  09/01/24 167 lb 2 oz (75.8 kg)  01/08/24 167 lb (75.8 kg)  12/20/23 164 lb (74.4 kg)    Physical Exam Vitals and nursing note reviewed.  Constitutional:      General: She is not in acute distress.    Appearance: Normal appearance. She is well-developed. She is not diaphoretic.     Comments: Well-appearing, comfortable, cooperative  HENT:     Head: Normocephalic and atraumatic.  Eyes:     General:         Right eye: No discharge.        Left eye: No discharge.     Conjunctiva/sclera: Conjunctivae normal.  Cardiovascular:     Rate and Rhythm: Normal rate.  Pulmonary:     Effort: Pulmonary effort is normal.  Skin:    General: Skin is warm and dry.     Findings: No erythema or rash.  Neurological:     Mental Status: She is alert and oriented to person, place, and time.  Psychiatric:        Mood and Affect: Mood normal.        Behavior: Behavior normal.        Thought Content: Thought content normal.     Comments: Well groomed, good eye contact, normal speech and thoughts     Results for orders placed or performed in visit on 01/28/24  ECHOCARDIOGRAM COMPLETE   Collection Time: 01/28/24 10:07 AM  Result Value Ref Range   AV Peak grad 6.8 mmHg   Ao pk vel 1.30 m/s   S' Lateral 3.10 cm   Area-P 1/2 3.08 cm2   AV Mean grad 4.0 mmHg   Est EF 60 - 65%       Assessment & Plan:   Problem List Items Addressed This Visit     Mixed hyperlipidemia   Relevant Medications   midodrine  (PROAMATINE ) 5 MG tablet   Other Relevant Orders   AMB Referral [facility] Care Management   Orthostatic hypotension - Primary   Relevant Medications   midodrine  (PROAMATINE ) 5 MG tablet   Other Relevant Orders   Ambulatory referral to Cardiology   AMB Referral [facility]BCI Care Management   Other Visit Diagnoses       Near syncope       Relevant Medications   midodrine  (PROAMATINE ) 5 MG tablet   Other Relevant Orders   Ambulatory referral to Cardiology   AMB Referral [facility] Care Management     Dizziness       Relevant Orders   Ambulatory referral to Cardiology   AMB Referral [facility]BCI Care Management         Orthostatic hypotension with recurrent dizziness and near syncope Complication since 01/2024 hospitalization, lack of resolution in past 6 months Unable to follow up on ECHO that was ordered outpatient and did not return to care, issues with insurance barriers Persistent dizziness and near syncope  due to orthostatic hypotension. Blood pressure drops upon standing. Meclizine  provides partial relief. On hospital DC Midodrine  never picked up by patient.  - Start midodrine  5 mg once daily with food,  increase to twice daily if needed. We opted for daily dosing for now to see if effective, instead of the parameters set by hospital for using it as needed only  - Refer to cardiologist for further evaluation and management. She would likely need further diagnostic work up ECHO, ZIO and may warrant future POTS referral if indicated. - Coordinate with case management for insurance issues and necessary testing. She will need care coordination - Monitor blood pressure at home, especially with position changes. - Encourage hydration and salty snacks to maintain blood volume.  Allergic rhinitis (on Xolair ) Managed with biweekly Xolair  injections. - Continue Xolair  injections biweekly.  General Health Maintenance Due for physical examination and fasting blood work. - Schedule physical examination for late November or early December with prior fasting blood work.       Orders Placed This Encounter  Procedures   Ambulatory referral to Cardiology    Referral Priority:   Routine    Referral Type:   Consultation    Referral Reason:   Specialty Services Required    Number of Visits Requested:   1   AMB Referral [facility] Care Management    Referral Priority:   Routine    Referral Type:   Consultation    Referral Reason:   Care Coordination    Number of Visits Requested:   1    Meds ordered this encounter  Medications   midodrine  (PROAMATINE ) 5 MG tablet    Sig: Take 1 tablet (5 mg total) by mouth 2 (two) times daily with a meal. Start with only once per day in morning, and can increase to the twice per day if it is needed.    Dispense:  60 tablet    Refill:  0    Follow up plan: Return for 6 weeks fasting lab > 1 week later Annual Physical.  Future labs ordered for 10/18/24   Marsa Officer, DO [facility] 09/01/2024, 3:41 PM

## 2024-09-01 NOTE — Patient Instructions (Addendum)
 Thank you for coming to the office today.  [facility]  - Case Manager / Social Worker Team through [facility] that can help us  coordinate your care better. We need to figure out what is happening with the insurance / coverage / testing and if we need referrals or other assistance.  Jackson Acron RN Case Management  Chrystal Land, LCSW   Start the Midodrine  5mg  daily in the morning with meal. For low BP. This should help raise your blood pressure to avoid these drops. If effective, we can consider taking TWICE per day if you need it later in the day. Otherwise just keep the once a day if it helps. And discuss with Cardiologist  Referral  [facility]) HeartCare at [facility] *preferred* [address] [place], [place] [address] Main: [phone]    Please schedule a Follow-up Appointment to: Return for 6 weeks fasting lab > 1 week later Annual Physical.  If you have any other questions or concerns, please feel free to call the office or send a message through MyChart. You may also schedule an earlier appointment if necessary.  Additionally, you may be receiving a survey about your experience at our office within a few days to 1 week by e-mail or mail. We value your feedback.  Marsa Officer, DO [facility], [place]

## 2024-09-02 ENCOUNTER — Telehealth: Payer: Self-pay | Admitting: *Deleted

## 2024-09-02 ENCOUNTER — Telehealth: Payer: Self-pay

## 2024-09-02 MED ORDER — MONTELUKAST SODIUM 10 MG PO TABS: 10.0000 mg | ORAL_TABLET | Freq: Every day | ORAL | 1 refills | Status: AC

## 2024-09-02 NOTE — Telephone Encounter (Signed)
 Courtesy refill sent for Singulair . Pt needs office visit.

## 2024-09-02 NOTE — Progress Notes (Signed)
 Complex Care Management Note  Care Guide Note 09/02/2024 Name: [name] MRN: [id number] DOB: [birth year]  Amy Hutchinson is a 63 y.o. year old female who sees Edman Marsa PARAS, DO for primary care. I reached out to Dorthea Hail by phone today to offer complex care management services.  Amy Hutchinson was given information about Complex Care Management services today including:   The Complex Care Management services include support from the care team which includes your Nurse Care Manager, Clinical Social Worker, or Pharmacist.  The Complex Care Management team is here to help remove barriers to the health concerns and goals most important to you. Complex Care Management services are voluntary, and the patient may decline or stop services at any time by request to their care team member.   Complex Care Management Consent Status: Patient agreed to services and verbal consent obtained.   Follow up plan:  Telephone appointment with complex care management team member scheduled for:  09/10/2024  Encounter Outcome:  Patient Scheduled  Jeoffrey Buffalo , RMA     [facility] Guide  Direct Dial: [phone]  Website: [email or web address]

## 2024-09-10 ENCOUNTER — Telehealth: Payer: Self-pay

## 2024-09-16 ENCOUNTER — Ambulatory Visit: Attending: Physician Assistant | Admitting: Physician Assistant

## 2024-09-16 NOTE — Progress Notes (Deleted)
 Cardiology Office Note    Date:  09/16/2024   ID:  Amy Hutchinson, DOB 1961-07-12, MRN [id number]  PCP:  Edman Marsa PARAS, DO  Cardiologist:  None  Electrophysiologist:  None   Chief Complaint: ***  History of Present Illness:   Amy Hutchinson is a 63 y.o. female with history of syncope felt to be secondary to orthostatic hypotension, vertigo, meningioma s/p resection, HLD, tobacco use, depression with anxiety, and GERD who presents for the evaluation of orthostasis at the request of Dr. Edman, MD.  She was admitted to the hospital in 12/2023 with multiple episodes of syncope in the preceding days felt to be secondary to orthostatic hypotension.  Neurological evaluation including CT of the head, MRI of the brain, and EEG were unrevealing.  Carotid artery ultrasound showed 1 to 49% bilateral ICA stenosis.  Echo in 01/2024 showed an EF of 60 to 65%, no regional wall motion abnormalities, normal LV diastolic function parameters, normal RV systolic function and ventricular cavity size, no significant valvular abnormality, and an estimated right atrial pressure of 3 mmHg.  Orthostatic vital signs were positive for from sitting to standing.  She was prescribed midodrine  to take as needed.  She was seen by her PCP on 09/01/2024 reporting persistent dizziness and vertigo since hospital admission in 12/2023.  Symptoms were associated with headaches and a room spinning sensation.  She was without frank syncope though did report near syncope.  She reported meclizine  provided partial relief.  Note indicates previously recommended midodrine  was never picked up.  It was again recommended that she start midodrine  and was referred to cardiology.  ***   Labs independently reviewed: 12/2023 - high-sensitivity troponin negative x 2, potassium 4.2, BUN 15, serum creatinine 0.8, albumin 4.1, AST/ALT normal, Hgb 14.0, PLT 316 01/2023 - TSH normal 03/2022 - A1c 5.9, TC 174, TG 181, HDL 40, LDL 104  Past  Medical History:  Diagnosis Date   Anxiety    Back pain    Depression    Hyperlipidemia    Recurrent upper respiratory infection (URI)     Past Surgical History:  Procedure Laterality Date   ABDOMINAL HYSTERECTOMY     BIOPSY BREAST Left 2021   bx coil clip   BRAIN SURGERY     NECK SURGERY     TONSILLECTOMY AND ADENOIDECTOMY      Current Medications: No outpatient medications have been marked as taking for the 09/16/24 encounter (Appointment) with Abigail Bernardino HERO, PA-C.   Current Facility-Administered Medications for the 09/16/24 encounter (Appointment) with Abigail Bernardino HERO, PA-C  Medication   omalizumab  (XOLAIR ) prefilled syringe 300 mg    Allergies:   Codeine, Darvon [propoxyphene], Dilaudid [hydromorphone], Hydrocodone, and Percocet [oxycodone-acetaminophen ]   Social History   Socioeconomic History   Marital status: Married    Spouse name: Not on file   Number of children: Not on file   Years of education: High School   Highest education level: High school graduate  Occupational History   Not on file  Tobacco Use   Smoking status: Every Day    Current packs/day: 0.40    Average packs/day: 0.4 packs/day for 32.0 years (12.8 ttl pk-yrs)    Types: Cigarettes   Smokeless tobacco: Never  Vaping Use   Vaping status: Never Used  Substance and Sexual Activity   Alcohol use: Not Currently   Drug use: Never   Sexual activity: Not on file  Other Topics Concern   Not on file  Social History Narrative  Not on file   Social Drivers of Health   Financial Resource Strain: Not on file  Food Insecurity: No Food Insecurity (12/20/2023)   Hunger Vital Sign    Worried About Running Out of Food in the Last Year: Never true    Ran Out of Food in the Last Year: Never true  Transportation Needs: No Transportation Needs (12/20/2023)   PRAPARE - Administrator, Civil Service (Medical): No    Lack of Transportation (Non-Medical): No  Physical Activity: Not on file   Stress: Not on file  Social Connections: Moderately Isolated (12/20/2023)   Social Connection and Isolation Panel    Frequency of Communication with Friends and Family: More than three times a week    Frequency of Social Gatherings with Friends and Family: More than three times a week    Attends Religious Services: Never    Database administrator or Organizations: No    Attends Engineer, structural: Never    Marital Status: Married     Family History:  The patient's family history includes Anxiety disorder in her mother, sister, and sister; Bipolar disorder in her mother; Depression in her mother and sister; Diabetes in her mother; Lung cancer in her mother. There is no history of Colon cancer or Breast cancer.  ROS:   12-point review of systems is negative unless otherwise noted in the HPI.   EKGs/Labs/Other Studies Reviewed:    Studies reviewed were summarized above. The additional studies were reviewed today:  2D echo 01/28/2024: 1. Left ventricular ejection fraction, by estimation, is 60 to 65%. Left  ventricular ejection fraction by 3D volume is 65 %. The left ventricle has  normal function. The left ventricle has no regional wall motion  abnormalities. Left ventricular diastolic   parameters were normal. The average left ventricular global longitudinal  strain is -14.4 %. The global longitudinal strain is indeterminate.   2. Right ventricular systolic function is normal. The right ventricular  size is normal. Tricuspid regurgitation signal is inadequate for assessing  PA pressure.   3. The mitral valve is normal in structure. No evidence of mitral valve  regurgitation. No evidence of mitral stenosis.   4. The aortic valve is normal in structure. Aortic valve regurgitation is  not visualized. No aortic stenosis is present.   5. The inferior vena cava is normal in size with greater than 50%  respiratory variability, suggesting right atrial pressure of 3 mmHg.   __________  Carotid artery ultrasound 12/19/2023: IMPRESSION: 1. No evidence of flow-limiting stenosis within either internal carotid artery. Estimated 0-49% stenosis bilaterally.    EKG:  EKG is ordered today.  The EKG ordered today demonstrates ***  Recent Labs: 12/19/2023: ALT 14; BUN 15; Creatinine, Ser 0.80; Hemoglobin 14.0; Platelets 316; Potassium 4.2; Sodium 138  Recent Lipid Panel    Component Value Date/Time   CHOL 174 03/18/2022 0859   TRIG 181 (H) 03/18/2022 0859   HDL 40 (L) 03/18/2022 0859   CHOLHDL 4.4 03/18/2022 0859   LDLCALC 104 (H) 03/18/2022 0859    PHYSICAL EXAM:    VS:  There were no vitals taken for this visit.  BMI: There is no height or weight on file to calculate BMI.  Physical Exam  Wt Readings from Last 3 Encounters:  09/01/24 167 lb 2 oz (75.8 kg)  01/08/24 167 lb (75.8 kg)  12/20/23 164 lb (74.4 kg)     ASSESSMENT & PLAN:   ***   {Are you  ordering a CV Procedure (e.g. stress test, cath, DCCV, TEE, etc)?   Press F2        :[id number]}     Disposition: F/u with Dr. PIERRETTE or an APP in ***.   Medication Adjustments/Labs and Tests Ordered: Current medicines are reviewed at length with the patient today.  Concerns regarding medicines are outlined above. Medication changes, Labs and Tests ordered today are summarized above and listed in the Patient Instructions accessible in Encounters.   Signed, Bernardino Bring, PA-C 09/16/2024 11:01 AM     [facility] [address] [place], [place] [address] [phone]

## 2024-09-21 ENCOUNTER — Ambulatory Visit (INDEPENDENT_AMBULATORY_CARE_PROVIDER_SITE_OTHER)

## 2024-09-21 ENCOUNTER — Ambulatory Visit

## 2024-09-21 VITALS — BP 104/80 | HR 85 | Ht 64.0 in | Wt 166.0 lb

## 2024-09-21 DIAGNOSIS — R55 Syncope and collapse: Secondary | ICD-10-CM | POA: Diagnosis not present

## 2024-09-21 DIAGNOSIS — I951 Orthostatic hypotension: Secondary | ICD-10-CM

## 2024-09-21 NOTE — Patient Instructions (Signed)
 Medication Instructions:  Your physician recommends that you continue on your current medications as directed. Please refer to the Current Medication list given to you today.  *If you need a refill on your cardiac medications before your next appointment, please call your pharmacy*  Lab Work: No labs ordered today  If you have labs (blood work) drawn today and your tests are completely normal, you will receive your results only by: MyChart Message (if you have MyChart) OR A paper copy in the mail If you have any lab test that is abnormal or we need to change your treatment, we will call you to review the results.  Testing/Procedures: Amy Hutchinson- Long Term Monitor Instructions  Your physician has requested you wear a ZIO patch monitor for 14 days.  This is a single patch monitor. [employer] supplies one patch monitor per enrollment. Additional stickers are not available. Please do not apply patch if you will be having a Nuclear Stress Test, Echocardiogram, Cardiac CT, MRI, or Chest Xray during the period you would be wearing the monitor. The patch cannot be worn during these tests. You cannot remove and re-apply the ZIO XT patch monitor.  Your ZIO patch monitor will be mailed 3 day USPS to your address on file. It may take 3-5 days to receive your monitor after you have been enrolled. Once you have received your monitor, please review the enclosed instructions. Your monitor has already been registered assigning a specific monitor serial number to you.  Billing and Patient Assistance Program Information  We have supplied [employer] with any of your insurance information on file for billing purposes.  [employer] offers a sliding scale Patient Assistance Program for patients that do not have insurance, or whose insurance does not completely cover the cost of the ZIO monitor.  You must apply for the Patient Assistance Program to qualify for this discounted rate.  To apply, please call [employer] at [phone],  select option 4, select option 2, ask to apply for Patient Assistance Program. Meredeth will ask your household income, and how many people are in your household. They will quote your out-of-pocket cost based on that information. [employer] will also be able to set up a 16-month, interest-free payment plan if needed.  Applying the monitor   Shave hair from upper left chest.  Hold abrader disc by orange tab. Rub abrader in 40 strokes over the upper left chest as indicated in your monitor instructions.  Clean area with 4 enclosed alcohol pads. Let dry.  Apply patch as indicated in monitor instructions. Patch will be placed under collarbone on left side of chest with arrow pointing upward.  Rub patch adhesive wings for 2 minutes. Remove white label marked 1. Remove the white label marked 2. Rub patch adhesive wings for 2 additional minutes.  While looking in a mirror, press and release button in center of patch. A small green light will flash 3-4 times. This will be your only indicator that the monitor has been turned on.   After Applying Monitor: Do not shower for the first 24 hours. You may shower after the first 24 hours.  Press the button if you feel a symptom. You will hear a small click. Record Date, Time and Symptom in the Patient Logbook.   After Completing 14 Days: When you are ready to remove the patch, follow instructions on the last 2 pages of Patient Logbook.  Stick patch monitor into the tabs at the bottom of the return box.  Place Patient Logbook in  the blue and white box. Use locking tab on box and tape box closed securely. The blue and white box has prepaid postage on it. Please place it in the mailbox as soon as possible. Your physician should have your test results approximately 7-14 days after the monitor has been mailed back to [facility].   Troubleshooting: Call [facility] at [phone] if you have questions regarding your ZIO XT patch monitor.  Call  them immediately if you see an orange light blinking on your monitor.  If your monitor falls off in less than 4 days, contact our Monitor department at [phone].  If your monitor becomes loose or falls off after 4 days call [employer] at [phone] for suggestions on securing your monitor.   Follow-Up: At [facility], you and your health needs are our priority.  As part of our continuing mission to provide you with exceptional heart care, our providers are all part of one team.  This team includes your primary Cardiologist (physician) and Advanced Practice Providers or APPs (Physician Assistants and Nurse Practitioners) who all work together to provide you with the care you need, when you need it.  Your next appointment:   As needed   Provider:   You may see Caron Poser, MD or one of the following Advanced Practice Providers on your designated Care Team:   Lonni Meager, NP Lesley Maffucci, PA-C Bernardino Bring, PA-C Cadence Cornelius, PA-C Tylene Lunch, NP Barnie Hila, NP    We recommend signing up for the patient portal called MyChart.  Sign up information is provided on this After Visit Summary.  MyChart is used to connect with patients for Virtual Visits (Telemedicine).  Patients are able to view lab/test results, encounter notes, upcoming appointments, etc.  Non-urgent messages can be sent to your provider as well.   To learn more about what you can do with MyChart, go to [email or web address].

## 2024-09-21 NOTE — Progress Notes (Signed)
  Cardiology Office Note   Date:  09/21/2024  ID:  Amy Hutchinson, DOB 06/19/1961, MRN [id number] PCP: Edman Marsa PARAS, DO  [facility] Providers Cardiologist:  Caron Poser, MD     History of Present Illness Amy Hutchinson is a 63 y.o. female PMH allergies, anxiety, HLD, reported history of meningioma status post resection who presents for further evaluation and management of orthostatic hypotension.  Patient was hospitalized for syncope 12/2023.  Workup was only revealing for some mild orthostasis.  Other testing including TTE, carotid Dopplers, MRI brain, were all negative.  Patient reports that she has not had any more syncopal episodes since her hospitalization.  She was started on low-dose midodrine  earlier this month and she reports that she is taking it.  She notes somewhat frequent dizziness with standing.  She does say that the symptoms seem to be more consistent with room spinning rather than lightheadedness.  She says that she has had ENT evaluations in the past for this issue.  It does not sound as though there are any plans for further workup or treatment from an ENT perspective.  Relevant CVD History - TTE 01/2024 normal biventricular function and no valvular disease - Normal carotid Doppler 12/2023 - MRI brain 12/2023 without any abnormal findings   ROS: Pt denies any chest discomfort, jaw pain, arm pain, palpitations, syncope, presyncope, orthopnea, PND, or LE edema.  Studies Reviewed I have independently reviewed the patient's ECG, previous medical records, previous cardiac testing.  Physical Exam VS:  BP 104/80 (BP Location: Left Arm, Patient Position: Sitting, Cuff Size: Normal)   Pulse 85   Ht 5' 4 (1.626 m)   Wt 166 lb (75.3 kg)   SpO2 96%   BMI 28.49 kg/m   Orthostatic VS for the past 24 hrs (Last 3 readings):  BP- Lying Pulse- Lying BP- Sitting Pulse- Sitting BP- Standing at 0 minutes Pulse- Standing at 0 minutes BP- Standing at 3 minutes  Pulse- Standing at 3 minutes  09/21/24 1441 106/68 80 102/65 85 113/76 93 125/78 98      Wt Readings from Last 3 Encounters:  09/21/24 166 lb (75.3 kg)  09/01/24 167 lb 2 oz (75.8 kg)  01/08/24 167 lb (75.8 kg)    GEN: No acute distress. NECK: No JVD; No carotid bruits. CARDIAC: RRR, no murmurs, rubs, gallops. RESPIRATORY:  Clear to auscultation. EXTREMITIES:  Warm and well-perfused. No edema.  ASSESSMENT AND PLAN Orthostatic hypotension Syncope and collapse Patient presents for further evaluation of syncopal episodes and orthostasis.  She fortunately has not had any further syncope since discharge in January.  Her echocardiogram from February was normal.  She does not have any signs of orthostatic hypotension in office today, though she is taking her midodrine .  Her symptoms are described as room spinning, so I do wonder if this is more consistent with vertigo rather than a vasodepressive issue.  Either way, she does seem to be responding to the medication.  Plan: - Will obtain Zio monitor to rule out arrhythmogenic cause of symptoms - Since she does not have orthostatic hypotension measured today while taking midodrine , we should continue on the current dose.  While in the office, she also said she did not experience dizziness while standing, further supporting continued therapy. - Would continue to work up and evaluate vertiginous etiologies since that seems to be the predominant symptom       Dispo: RTC as needed based on results of cardiac testing  Signed, Caron Poser, MD

## 2024-09-22 ENCOUNTER — Other Ambulatory Visit: Payer: Self-pay

## 2024-09-22 NOTE — Patient Outreach (Signed)
 Complex Care Management   Visit Note  09/22/2024  Name:  Amy Hutchinson MRN: [id number] DOB: 1961-11-12  Situation: Referral received for Complex Care Management related to High Risk for Readmission for dizziness/vertigo/near syncope/hypotension. I obtained verbal consent from Patient.  Visit completed with Amy Hutchinson  on the phone.  States near syncope symptoms have resolved since starting Midodrine  once a day.  Was able to attend Cardio appt on 09/21/24, Cardio ordered Zio Patch for 14-day monitoring.  Denies dizziness/lightheadedness/near syncope symptoms today.  Background:   Past Medical History:  Diagnosis Date   Anxiety    Back pain    Depression    Hyperlipidemia    Recurrent upper respiratory infection (URI)     Assessment: Patient Reported Symptoms:  Cognitive Cognitive Status: Alert and oriented to person, place, and time, Insightful and able to interpret abstract concepts, Normal speech and language skills Cognitive/Intellectual Conditions Management [RPT]: None reported or documented in medical history or problem list      Neurological Neurological Review of Symptoms: No symptoms reported    HEENT HEENT Symptoms Reported: No symptoms reported      Cardiovascular Cardiovascular Symptoms Reported: No symptoms reported Does patient have uncontrolled Hypertension?: No Cardiovascular Management Strategies: Medication therapy Cardiovascular Self-Management Outcome: 5 (very good) Cardiovascular Comment: pateint was having syncope episodes with a hospitalization 12/19/2023.  She is taking Midodrine  once daily as ordered and has not had any further syncope episodes.  Cardiology ordered a Zio Patch on OV 09/21/24, she is expecting it to be delivered within 3-5 days.  Cardio is wanting to completely rule out heart problems and will  continue to work up and evaluate vertiginous etiologies since that seems to be the predominant symptom, per OV notes 09/21/24.  Respiratory  Respiratory Symptoms Reported: No symptoms reported    Endocrine Endocrine Symptoms Reported: No symptoms reported Is patient diabetic?: No    Gastrointestinal Gastrointestinal Symptoms Reported: No symptoms reported      Genitourinary Genitourinary Symptoms Reported: No symptoms reported    Integumentary Integumentary Symptoms Reported: No symptoms reported    Musculoskeletal Musculoskelatal Symptoms Reviewed: No symptoms reported     Patient at Risk for Falls Due to: No Fall Risks  Psychosocial       Quality of Family Relationships: helpful, involved, supportive Do you feel physically threatened by others?: No    09/22/2024    PHQ2-9 Depression Screening   Little interest or pleasure in doing things Not at all  Feeling down, depressed, or hopeless Not at all  PHQ-2 - Total Score 0  Trouble falling or staying asleep, or sleeping too much    Feeling tired or having little energy    Poor appetite or overeating     Feeling bad about yourself - or that you are a failure or have let yourself or your family down    Trouble concentrating on things, such as reading the newspaper or watching television    Moving or speaking so slowly that other people could have noticed.  Or the opposite - being so fidgety or restless that you have been moving around a lot more than usual    Thoughts that you would be better off dead, or hurting yourself in some way    PHQ2-9 Total Score    If you checked off any problems, how difficult have these problems made it for you to do your work, take care of things at home, or get along with other people    Depression Interventions/Treatment  There were no vitals filed for this visit.  Medications Reviewed Today     Reviewed by Lucian Santana LABOR, RN (Registered Nurse) on 09/22/24 at 1016  Med List Status: <None>   Medication Order Taking? Sig Documenting Provider Last Dose Status Informant  alprazolam  (XANAX ) 2 MG tablet [id number] Yes Take 0.5  tablets (1 mg total) by mouth 4 (four) times daily as needed for sleep or anxiety. Edman Marsa PARAS, DO  Active Self, Pharmacy Records  citalopram  (CELEXA ) 40 MG tablet [id number] Yes Take 1 tablet (40 mg total) by mouth daily. Edman Marsa PARAS, DO  Active Self, Pharmacy Records  EPINEPHrine  0.3 mg/0.3 mL IJ SOAJ injection [id number] Yes Inject 0.3 mg into the muscle as needed for anaphylaxis. Cari Arlean HERO, FNP  Active   meclizine  (ANTIVERT ) 25 MG tablet [id number] Yes Take 1 tablet (25 mg total) by mouth 3 (three) times daily as needed for dizziness. Edman Marsa PARAS, DO  Active   midodrine  (PROAMATINE ) 5 MG tablet [id number] Yes Take 1 tablet (5 mg total) by mouth 2 (two) times daily with a meal. Start with only once per day in morning, and can increase to the twice per day if it is needed. Edman Marsa PARAS, DO  Active   montelukast  (SINGULAIR ) 10 MG tablet [id number] Yes Take 1 tablet (10 mg total) by mouth at bedtime. Cari Arlean HERO, FNP  Active   omalizumab  (XOLAIR ) 300 MG/2  ML prefilled syringe [id number] Yes Inject 300 mg into the skin every 14 (fourteen) days. Jeneal Danita Macintosh, MD  Active   omalizumab  (XOLAIR ) prefilled syringe 300 mg [id number]   Jeneal Danita Macintosh, MD  Active   ondansetron  (ZOFRAN -ODT) 4 MG disintegrating tablet [id number]  Take 1 tablet (4 mg total) by mouth every 8 (eight) hours as needed.  Patient not taking: Reported on 09/22/2024   Kennyth Domino, FNP  Active Self, Pharmacy Records  QUEtiapine  (SEROQUEL ) 100 MG tablet [id number] Yes Take 1 tablet (100 mg total) by mouth at bedtime. Edman Marsa PARAS, DO  Active Self, Pharmacy Records  rosuvastatin  (CRESTOR ) 10 MG tablet [id number] Yes TAKE 1 TABLET BY MOUTH AT BEDTIME Edman Marsa PARAS, DO  Active             Recommendation:   PCP follow up 11/01/2024 Specialty provider follow-up : Allergist 09/23/24 Continue Current Plan of Care Patient will apply Zio Patch  when received as ordered by Dr. Jacquelynn, will wear for 14 days then return.  No f/u scheduled with Cardio as of yet until after results are read from Zio Patch monitoring.    Follow Up Plan:   Telephone follow-up two weeks  Santana Lucian BSN, CCM [place]  [facility] Population Health RN Care Manager Direct Dial: [phone]  Fax: [phone]

## 2024-09-22 NOTE — Patient Instructions (Signed)
 Visit Information  Thank you for taking time to visit with me today. Please don't hesitate to contact me if I can be of assistance to you before our next scheduled appointment.  Our next appointment is by telephone on Wednesday, November 5th at 10:00am Please call the care guide team at [phone] if you need to cancel or reschedule your appointment.   Following is a copy of your care plan:   Goals Addressed             This Visit's Progress    VBCI RN Care Plan   On track    Problems:  Chronic Disease Management support and education needs related to Hypotension   Goal: Over the next 2 months the Patient will demonstrate Ongoing adherence to prescribed treatment plan for hypotension as evidenced by no syncopal episodes Over the next 20 days, patient will receive Zio Patch and wear for 14 days, AEB Cardiology receiving Zio Patch after 14 day wear time.    Interventions:   Evaluation of current treatment plan related to Hypotension, self-management and patient's adherence to plan as established by provider. Discussed plans with patient for ongoing care management follow up and provided patient with direct contact information for care management team Provided education to patient re: following directions for Zio Patch Reviewed medications with patient and discussed Midodrine  Reviewed scheduled/upcoming provider appointments including Cardiology (no planned follow up until after Zio Patch wear time is completed).  Screening for signs and symptoms of depression related to chronic disease state  Assessed social determinant of health barriers .   Patient Self-Care Activities:  Attend all scheduled provider appointments Call provider office for new concerns or questions  Take medications as prescribed   Apply Zio Patch as ordered.   Plan:  Telephone follow up appointment with care management team member scheduled for:  two weeks             A reminder to ALL  patients/family/friends, please call the [place]  National Suicide Prevention Lifeline: 408-207-9408 or TTY: 951 055 9932 TTY [phone]) to talk to a trained counselor if you are experiencing a Mental Health or Behavioral Health Crisis or need someone to talk to.  Patient verbalizes understanding of instructions and care plan provided today and agrees to view in MyChart. Active MyChart status and patient understanding of how to access instructions and care plan via MyChart confirmed with patient.     Santana Stamp BSN, CCM [place]  [facility] Population Health RN Care Manager Direct Dial: [phone]  Fax: [phone]

## 2024-09-23 ENCOUNTER — Encounter: Payer: Self-pay | Admitting: Family Medicine

## 2024-09-23 ENCOUNTER — Other Ambulatory Visit: Payer: Self-pay

## 2024-09-23 ENCOUNTER — Ambulatory Visit: Admitting: Family Medicine

## 2024-09-23 VITALS — BP 100/60 | HR 89 | Temp 98.0°F

## 2024-09-23 DIAGNOSIS — B999 Unspecified infectious disease: Secondary | ICD-10-CM | POA: Diagnosis not present

## 2024-09-23 DIAGNOSIS — L501 Idiopathic urticaria: Secondary | ICD-10-CM | POA: Diagnosis not present

## 2024-09-23 DIAGNOSIS — J310 Chronic rhinitis: Secondary | ICD-10-CM

## 2024-09-23 DIAGNOSIS — L299 Pruritus, unspecified: Secondary | ICD-10-CM

## 2024-09-23 NOTE — Progress Notes (Signed)
 [address] [place] [address] Dept: [phone]  FOLLOW UP NOTE  Patient ID: Amy Hutchinson, female    DOB: 1961/04/22  Age: 63 y.o. MRN: [id number] Date of Office Visit: 09/23/2024  Assessment  Chief Complaint: Follow-up and Urticaria (No concerns)  HPI Amy Hutchinson is a 63 year old female who presents to the clinic for follow-up visit.  She was last seen in this clinic on 02/27/2024 by Arlean Mutter, FNP, for evaluation of urticaria, chronic rhinitis, eustachian tube dysfunction, and recurrent infection.  Discussed the use of AI scribe software for clinical note transcription with the patient, who gave verbal consent to proceed.  History of Present Illness Amy Hutchinson is a 63 year old female who presents for follow-up regarding her allergy symptoms and treatment.  She experiences sporadic itching, particularly after hot showers, which is manageable. She takes Singulair  once daily at night and rotates an over-the-counter antihistamine every two weeks, taking it twice daily. She also uses famotidine  as needed for acid reflux and itching, noting that pickle juice helps alleviate her reflux symptoms.  She continues Xolair  injections 300 mg once every 2 weeks with no large or local reactions.  She reports a significant decrease in pruritus while continuing on Xolair  injections.  EpiPen  set is up-to-date.  She describes persistent nasal itching, both inside and out, which she finds bothersome. She uses Vaseline to address nasal dryness, which helps with the dryness but not the itching. She takes cetirizine or other over-the-counter allergy medications twice a day and Singulair  once a day.   She reports no infections requiring antibiotics or steroids since her last visit to this clinic.  She has previously suffered from eustachian tube dysfunction, however, at today's visit, she reports that she is not experiencing ear fullness, change in hearing, or popping in her ears.  She mentions a  history of dizziness and fainting, which led to a hospital stay in February.  She continues to follow-up with [facility] and is scheduled to wear a Zio patch.  Her current medications are listed in the chart.   Drug Allergies:  Allergies  Allergen Reactions   Codeine Nausea And Vomiting   Darvon [Propoxyphene] Nausea And Vomiting   Dilaudid [Hydromorphone] Nausea And Vomiting   Hydrocodone Nausea And Vomiting   Percocet [Oxycodone-Acetaminophen ] Nausea And Vomiting    Physical Exam: BP 100/60   Pulse 89   Temp 98 F (36.7 C)   SpO2 97%    Physical Exam Vitals reviewed.  Constitutional:      Appearance: Normal appearance.  HENT:     Head: Normocephalic and atraumatic.     Right Ear: Tympanic membrane normal.     Left Ear: Tympanic membrane normal.     Nose:     Comments: Bilateral nares slightly erythematous with thin clear nasal drainage noted.  Pharynx normal.  Ears normal.  Eyes normal.    Mouth/Throat:     Pharynx: Oropharynx is clear.  Eyes:     Conjunctiva/sclera: Conjunctivae normal.  Cardiovascular:     Rate and Rhythm: Normal rate and regular rhythm.     Heart sounds: Normal heart sounds. No murmur heard. Pulmonary:     Effort: Pulmonary effort is normal.     Breath sounds: Normal breath sounds.     Comments: Lungs clear to auscultation Musculoskeletal:        General: Normal range of motion.     Cervical back: Normal range of motion and neck supple.  Skin:  General: Skin is warm and dry.  Neurological:     Mental Status: She is alert and oriented to person, place, and time.  Psychiatric:        Mood and Affect: Mood normal.        Behavior: Behavior normal.        Thought Content: Thought content normal.        Judgment: Judgment normal.     Assessment and Plan: 1. Idiopathic urticaria   2. Pruritus   3. Recurrent infections   4. Chronic rhinitis     Patient Instructions  Hives (urticaria)/itching In the morning  take Zyrtec (cetirizine) 20 mg + Pepcid  (famotidine ) 20 mg In the evening  take Zyrtec (cetirizine) 20 mg + Pepcid  (famotidine ) 20 mg + montelukast  Remember to rotate to a different antihistamine about every 3 months. Some examples of over the counter antihistamines include Zyrtec (cetirizine), Xyzal (levocetirizine), Allegra  (fexofenadine ), and Claritin (loratidine).   May use Benadryl (diphenhydramine) as needed for breakthrough hives For now, continue Xolair  injections 300 mg once every 14 days and have access to an epinephrine  autoinjector set.  Keep a detailed symptom journal including foods eaten, contact with allergens, medications taken, weather changes.    Chronic rhinitis Consider nasal saline rinses for any nasal symptoms Consider nasal saline gel for dry nostrils  Recurrent infection Keep track of infections, antibiotic use, and steroid use  Call the clinic if this treatment plan is not working well for you.  Follow up in 6 months or sooner if needed.  Return in about 6 months (around 03/24/2025), or if symptoms worsen or fail to improve.    Thank you for the opportunity to care for this patient.  Please do not hesitate to contact me with questions.  Arlean Mutter, FNP [facility] 

## 2024-09-23 NOTE — Patient Instructions (Addendum)
 Hives (urticaria)/itching In the morning take Zyrtec (cetirizine) 20 mg + Pepcid  (famotidine ) 20 mg In the evening  take Zyrtec (cetirizine) 20 mg + Pepcid  (famotidine ) 20 mg + montelukast  Remember to rotate to a different antihistamine about every 3 months. Some examples of over the counter antihistamines include Zyrtec (cetirizine), Xyzal (levocetirizine), Allegra  (fexofenadine ), and Claritin (loratidine).   May use Benadryl (diphenhydramine) as needed for breakthrough hives For now, continue Xolair  injections 300 mg once every 14 days and have access to an epinephrine  autoinjector set.  Keep a detailed symptom journal including foods eaten, contact with allergens, medications taken, weather changes.    Chronic rhinitis Consider nasal saline rinses for any nasal symptoms Consider nasal saline gel for dry nostrils  Recurrent infection Keep track of infections, antibiotic use, and steroid use  Call the clinic if this treatment plan is not working well for you.  Follow up in 6 months or sooner if needed.

## 2024-10-06 ENCOUNTER — Other Ambulatory Visit: Payer: Self-pay

## 2024-10-06 NOTE — Patient Instructions (Signed)
 Visit Information  Thank you for taking time to visit with me today. Please don't hesitate to contact me if I can be of assistance to you before our next scheduled appointment.  Your next care management appointment is by telephone on Wednesday, November 12th at 3:00pm.  Please call the care guide team at [phone] if you need to cancel, schedule, or reschedule an appointment.   A reminder to ALL patients/family/friends, please call the [place]  National Suicide Prevention Lifeline: 212-181-6737 or TTY: 959 491 2371 TTY [phone]) to talk to a trained counselor if you are experiencing a Mental Health or Behavioral Health Crisis or need someone to talk to.  Santana Stamp BSN, CCM [place]  [facility] Population Health RN Care Manager Direct Dial: [phone]  Fax: [phone]

## 2024-10-06 NOTE — Patient Outreach (Signed)
 Complex Care Management   Visit Note  10/06/2024  Name:  Amy Hutchinson MRN: [id number] DOB: 11/28/1961  Situation: Referral received for Complex Care Management related to hypotension, insurance issues. I obtained verbal consent from Patient.  Visit completed with Amy Hutchinson  on the phone. Kept call brief, patient reporting sore throat, clogged ears, losing my voice, had raspy voice on the phone, mild-moderate fatigue, denies fever/cough.  Background:   Past Medical History:  Diagnosis Date   Anxiety    Back pain    Depression    Hyperlipidemia    Recurrent upper respiratory infection (URI)     Assessment: Patient Reported Symptoms:  Cognitive Cognitive Status: Alert and oriented to person, place, and time, Insightful and able to interpret abstract concepts      Neurological Neurological Review of Symptoms: No symptoms reported    HEENT HEENT Symptoms Reported: Sore throat, Other: HEENT Comment: States she is losing her voice, voice is raspy sounding on the phone, ears are clogged.    Cardiovascular Cardiovascular Symptoms Reported: Not assessed    Respiratory Respiratory Symptoms Reported: No symptoms reported    Endocrine Endocrine Symptoms Reported: Not assessed    Gastrointestinal Gastrointestinal Symptoms Reported: Not assessed      Genitourinary Genitourinary Symptoms Reported: Not assessed    Integumentary Integumentary Symptoms Reported: Not assessed    Musculoskeletal Musculoskelatal Symptoms Reviewed: Not assessed        Psychosocial Psychosocial Symptoms Reported: Not assessed          10/06/2024    PHQ2-9 Depression Screening   Little interest or pleasure in doing things    Feeling down, depressed, or hopeless    PHQ-2 - Total Score    Trouble falling or staying asleep, or sleeping too much    Feeling tired or having little energy    Poor appetite or overeating     Feeling bad about yourself - or that you are a failure or have let yourself or  your family down    Trouble concentrating on things, such as reading the newspaper or watching television    Moving or speaking so slowly that other people could have noticed.  Or the opposite - being so fidgety or restless that you have been moving around a lot more than usual    Thoughts that you would be better off dead, or hurting yourself in some way    PHQ2-9 Total Score    If you checked off any problems, how difficult have these problems made it for you to do your work, take care of things at home, or get along with other people    Depression Interventions/Treatment      There were no vitals filed for this visit.  Medications Reviewed Today     Reviewed by Lucian Santana LABOR, RN (Registered Nurse) on 10/06/24 at 1023  Med List Status: <None>   Medication Order Taking? Sig Documenting Provider Last Dose Status Informant  alprazolam  (XANAX ) 2 MG tablet [id number]  Take 0.5 tablets (1 mg total) by mouth 4 (four) times daily as needed for sleep or anxiety. Edman Marsa PARAS, DO  Active Self, Pharmacy Records  citalopram  (CELEXA ) 40 MG tablet [id number]  Take 1 tablet (40 mg total) by mouth daily. Edman Marsa PARAS, DO  Active Self, Pharmacy Records  EPINEPHrine  0.3 mg/0.3 mL IJ SOAJ injection [id number]  Inject 0.3 mg into the muscle as needed for anaphylaxis. Cari Arlean HERO, FNP  Active   meclizine  (ANTIVERT ) 25 MG tablet [id number]  Take 1 tablet (25 mg total) by mouth 3 (three) times daily as needed for dizziness. Edman Marsa PARAS, DO  Active   midodrine  (PROAMATINE ) 5 MG tablet [id number]  Take 1 tablet (5 mg total) by mouth 2 (two) times daily with a meal. Start with only once per day in morning, and can increase to the twice per day if it is needed. Edman Marsa PARAS, DO  Active   montelukast  (SINGULAIR ) 10 MG tablet [id number] Yes Take 1 tablet (10 mg total) by mouth at bedtime. Cari Arlean HERO, FNP  Active   omalizumab  (XOLAIR ) 300 MG/2  ML prefilled syringe  [id number]  Inject 300 mg into the skin every 14 (fourteen) days. Jeneal Danita Macintosh, MD  Active   omalizumab  (XOLAIR ) prefilled syringe 300 mg [id number]   Jeneal Danita Macintosh, MD  Active   ondansetron  (ZOFRAN -ODT) 4 MG disintegrating tablet [id number]  Take 1 tablet (4 mg total) by mouth every 8 (eight) hours as needed.  Patient not taking: Reported on 09/23/2024   Kennyth Domino, FNP  Active Self, Pharmacy Records  QUEtiapine  (SEROQUEL ) 100 MG tablet [id number]  Take 1 tablet (100 mg total) by mouth at bedtime. Edman Marsa PARAS, DO  Active Self, Pharmacy Records  rosuvastatin  (CRESTOR ) 10 MG tablet [id number]  TAKE 1 TABLET BY MOUTH AT BEDTIME Edman Marsa PARAS, DO  Active             Recommendation:   Discussed staying hydrated by drinking fluids throughout the day, eating light meals such as chicken noodle soup, take a prescribed anti-inflammatory for pain, and call MD office for appointment or go to Urgent Care if symptoms worsen.   Follow Up Plan:   Telephone follow-up in 1 week  Santana Stamp BSN, CCM [place]  [facility] Population Health RN Care Manager Direct Dial: [phone]  Fax: [phone]

## 2024-10-08 ENCOUNTER — Other Ambulatory Visit: Payer: Self-pay | Admitting: Family Medicine

## 2024-10-08 DIAGNOSIS — E782 Mixed hyperlipidemia: Secondary | ICD-10-CM

## 2024-10-11 NOTE — Telephone Encounter (Signed)
 Too soon for refill, LRF 08/30/24 FOR 90 DAYS.  Requested Prescriptions  Pending Prescriptions Disp Refills   rosuvastatin  (CRESTOR ) 10 MG tablet [Pharmacy Med Name: Rosuvastatin  Calcium  10 MG Oral Tablet] 90 tablet 0    Sig: TAKE 1 TABLET BY MOUTH AT BEDTIME     Cardiovascular:  Antilipid - Statins 2 Failed - 10/11/2024 10:41 AM      Failed - Lipid Panel in normal range within the last 12 months    Cholesterol  Date Value Ref Range Status  03/18/2022 174 <200 mg/dL Final   LDL Cholesterol (Calc)  Date Value Ref Range Status  03/18/2022 104 (H) mg/dL (calc) Final    Comment:    Reference range: <100 . Desirable range <100 mg/dL for primary prevention;   <70 mg/dL for patients with CHD or diabetic patients  with > or = 2 CHD risk factors. SABRA LDL-C is now calculated using the Martin-Hopkins  calculation, which is a validated novel method providing  better accuracy than the Friedewald equation in the  estimation of LDL-C.  Gladis APPLETHWAITE et al. SANDREA. 7986;689(80): 2061-2068  (http://education.[employer].com/faq/FAQ164)    HDL  Date Value Ref Range Status  03/18/2022 40 (L) > OR = 50 mg/dL Final   Triglycerides  Date Value Ref Range Status  03/18/2022 181 (H) <150 mg/dL Final         Passed - Cr in normal range and within 360 days    Creat  Date Value Ref Range Status  03/18/2022 0.83 0.50 - 1.03 mg/dL Final   Creatinine, Ser  Date Value Ref Range Status  12/19/2023 0.80 0.44 - 1.00 mg/dL Final         Passed - Patient is not pregnant      Passed - Valid encounter within last 12 months    Recent Outpatient Visits           1 month ago Orthostatic hypotension   [place] [facility] Edman Marsa PARAS, DO   9 months ago Syncope, unspecified syncope type   [facility] [place], Marsa PARAS, [place]

## 2024-10-13 ENCOUNTER — Other Ambulatory Visit: Payer: Self-pay

## 2024-10-14 NOTE — Patient Outreach (Signed)
 Complex Care Management   Visit Note  10/14/2024  Name:  Mi Balla MRN: [id number] DOB: [birth year]  Situation: Referral received for Complex Care Management related to Hypotension, issues with insurance. I obtained verbal consent from Patient.  Visit completed with Ms. Demedeiros  on the phone. Patient reports compliance with Midodrine  which has resolved syncope episodes.   Background:   Past Medical History:  Diagnosis Date   Anxiety    Back pain    Depression    Hyperlipidemia    Recurrent upper respiratory infection (URI)     Assessment: Patient Reported Symptoms:  Cognitive Cognitive Status: No symptoms reported, Alert and oriented to person, place, and time, Insightful and able to interpret abstract concepts, Normal speech and language skills      Neurological Neurological Review of Symptoms: No symptoms reported    HEENT HEENT Symptoms Reported: No symptoms reported HEENT Comment: Sore throat has resolved, feels much better today    Cardiovascular Cardiovascular Symptoms Reported: No symptoms reported Cardiovascular Comment: Continues to use Midodrine  as ordered, has not had any syncope episodes.  Respiratory Respiratory Symptoms Reported: No symptoms reported    Endocrine Endocrine Symptoms Reported: Not assessed    Gastrointestinal Gastrointestinal Symptoms Reported: Not assessed      Genitourinary Genitourinary Symptoms Reported: Not assessed    Integumentary Integumentary Symptoms Reported: Not assessed    Musculoskeletal Musculoskelatal Symptoms Reviewed: Not assessed        Psychosocial Psychosocial Symptoms Reported: Not assessed          10/14/2024    PHQ2-9 Depression Screening   Little interest or pleasure in doing things    Feeling down, depressed, or hopeless    PHQ-2 - Total Score    Trouble falling or staying asleep, or sleeping too much    Feeling tired or having little energy    Poor appetite or overeating     Feeling bad about  yourself - or that you are a failure or have let yourself or your family down    Trouble concentrating on things, such as reading the newspaper or watching television    Moving or speaking so slowly that other people could have noticed.  Or the opposite - being so fidgety or restless that you have been moving around a lot more than usual    Thoughts that you would be better off dead, or hurting yourself in some way    PHQ2-9 Total Score    If you checked off any problems, how difficult have these problems made it for you to do your work, take care of things at home, or get along with other people    Depression Interventions/Treatment      There were no vitals filed for this visit.    MEDICATIONS: Patient denies problems with meds today, no issues with refills, taking as prescribed.    Recommendation:   Discussed upcoming appts:  PCP 11/01/24 Specialty provider follow-up : Cardiology has informed patient to make appointment as needed at this time.  Patient has requested to be disenrolled from [facility] services at this time.  Made sure patient has my contact information, informed patient of our team and services for any future needs.   Follow Up Plan:   Closing From:  Complex Care Management, per patient request.  Santana Stamp BSN, CCM [place]  [facility] Population Health RN Care Manager Direct Dial: [phone]  Fax: [phone]

## 2024-10-14 NOTE — Patient Instructions (Signed)
 Visit Information  Thank you for taking time to visit with me today. Please don't hesitate to contact me if I can be of assistance to you for any future case management needs.  Just as a reminder, we have a case production designer, theatre/television/film, pharmacist, and social workers on our team to address problems and concerns.   Your next care management appointment is no further scheduled appointments.   Closing From: Complex Care Management.  Please call the care guide team at [phone] if you need to schedule an appointment.   A reminder to ALL patients/family/friends, please call the [place]  National Suicide Prevention Lifeline: (364)585-0370 or TTY: (626)831-5094 TTY [phone]) to talk to a trained counselor if you are experiencing a Mental Health or Behavioral Health Crisis or need someone to talk to.  Santana Stamp BSN, CCM [place]  [facility]BC[facility] Population Health RN Care Manager Direct Dial: [phone]  Fax: [phone]

## 2024-10-18 ENCOUNTER — Other Ambulatory Visit

## 2024-10-18 DIAGNOSIS — E782 Mixed hyperlipidemia: Secondary | ICD-10-CM

## 2024-10-18 DIAGNOSIS — Z0000 Encounter for general adult medical examination without abnormal findings: Secondary | ICD-10-CM

## 2024-10-18 DIAGNOSIS — R7303 Prediabetes: Secondary | ICD-10-CM

## 2024-10-20 ENCOUNTER — Other Ambulatory Visit

## 2024-10-20 DIAGNOSIS — R55 Syncope and collapse: Secondary | ICD-10-CM | POA: Diagnosis not present

## 2024-10-20 DIAGNOSIS — I951 Orthostatic hypotension: Secondary | ICD-10-CM | POA: Diagnosis not present

## 2024-10-20 DIAGNOSIS — Z0000 Encounter for general adult medical examination without abnormal findings: Secondary | ICD-10-CM | POA: Diagnosis not present

## 2024-10-20 DIAGNOSIS — R7303 Prediabetes: Secondary | ICD-10-CM | POA: Diagnosis not present

## 2024-10-20 DIAGNOSIS — E782 Mixed hyperlipidemia: Secondary | ICD-10-CM | POA: Diagnosis not present

## 2024-10-21 LAB — CBC WITH DIFFERENTIAL/PLATELET
Absolute Lymphocytes: 4040 {cells}/uL — ABNORMAL HIGH (ref 850–3900)
Absolute Monocytes: 505 {cells}/uL (ref 200–950)
Basophils Absolute: 71 {cells}/uL (ref 0–200)
Basophils Relative: 0.7 %
Eosinophils Absolute: 162 {cells}/uL (ref 15–500)
Eosinophils Relative: 1.6 %
HCT: 44.1 % (ref 35.0–45.0)
Hemoglobin: 14.3 g/dL (ref 11.7–15.5)
MCH: 28.9 pg (ref 27.0–33.0)
MCHC: 32.4 g/dL (ref 32.0–36.0)
MCV: 89.3 fL (ref 80.0–100.0)
MPV: 10.2 fL (ref 7.5–12.5)
Monocytes Relative: 5 %
Neutro Abs: 5323 {cells}/uL (ref 1500–7800)
Neutrophils Relative %: 52.7 %
Platelets: 314 Thousand/uL (ref 140–400)
RBC: 4.94 Million/uL (ref 3.80–5.10)
RDW: 13.1 % (ref 11.0–15.0)
Total Lymphocyte: 40 %
WBC: 10.1 Thousand/uL (ref 3.8–10.8)

## 2024-10-21 LAB — COMPREHENSIVE METABOLIC PANEL WITH GFR
AG Ratio: 2 (calc) (ref 1.0–2.5)
ALT: 15 U/L (ref 6–29)
AST: 12 U/L (ref 10–35)
Albumin: 4.1 g/dL (ref 3.6–5.1)
Alkaline phosphatase (APISO): 81 U/L (ref 37–153)
BUN: 14 mg/dL (ref 7–25)
CO2: 22 mmol/L (ref 20–32)
Calcium: 9.2 mg/dL (ref 8.6–10.4)
Chloride: 110 mmol/L (ref 98–110)
Creat: 0.74 mg/dL (ref 0.50–1.05)
Globulin: 2.1 g/dL (ref 1.9–3.7)
Glucose, Bld: 100 mg/dL — ABNORMAL HIGH (ref 65–99)
Potassium: 4.1 mmol/L (ref 3.5–5.3)
Sodium: 142 mmol/L (ref 135–146)
Total Bilirubin: 0.4 mg/dL (ref 0.2–1.2)
Total Protein: 6.2 g/dL (ref 6.1–8.1)
eGFR: 92 mL/min/1.73m2 (ref >=60)

## 2024-10-21 LAB — LIPID PANEL
Cholesterol: 144 mg/dL (ref <200)
HDL: 39 mg/dL — ABNORMAL LOW (ref >=50)
LDL Cholesterol (Calc): 80 mg/dL
Non-HDL Cholesterol (Calc): 105 mg/dL (ref <130)
Total CHOL/HDL Ratio: 3.7 (calc) (ref <5.0)
Triglycerides: 152 mg/dL — ABNORMAL HIGH (ref <150)

## 2024-10-21 LAB — TSH: TSH: 1.43 m[IU]/L (ref 0.40–4.50)

## 2024-10-21 LAB — HEMOGLOBIN A1C
Hgb A1c MFr Bld: 6.1 % — ABNORMAL HIGH (ref <5.7)
Mean Plasma Glucose: 128 mg/dL
eAG (mmol/L): 7.1 mmol/L

## 2024-10-23 ENCOUNTER — Ambulatory Visit: Payer: Self-pay

## 2024-10-23 DIAGNOSIS — R55 Syncope and collapse: Secondary | ICD-10-CM | POA: Diagnosis not present

## 2024-11-01 ENCOUNTER — Ambulatory Visit (INDEPENDENT_AMBULATORY_CARE_PROVIDER_SITE_OTHER): Admitting: Family Medicine

## 2024-11-01 ENCOUNTER — Encounter: Payer: Self-pay | Admitting: Family Medicine

## 2024-11-01 VITALS — BP 122/68 | HR 91 | Ht 64.0 in | Wt 168.0 lb

## 2024-11-01 DIAGNOSIS — Z0000 Encounter for general adult medical examination without abnormal findings: Secondary | ICD-10-CM

## 2024-11-01 DIAGNOSIS — Z1211 Encounter for screening for malignant neoplasm of colon: Secondary | ICD-10-CM

## 2024-11-01 DIAGNOSIS — F3340 Major depressive disorder, recurrent, in remission, unspecified: Secondary | ICD-10-CM

## 2024-11-01 DIAGNOSIS — Z1231 Encounter for screening mammogram for malignant neoplasm of breast: Secondary | ICD-10-CM

## 2024-11-01 DIAGNOSIS — R7303 Prediabetes: Secondary | ICD-10-CM

## 2024-11-01 DIAGNOSIS — E782 Mixed hyperlipidemia: Secondary | ICD-10-CM

## 2024-11-01 DIAGNOSIS — N823 Fistula of vagina to large intestine: Secondary | ICD-10-CM

## 2024-11-01 DIAGNOSIS — M4802 Spinal stenosis, cervical region: Secondary | ICD-10-CM

## 2024-11-01 DIAGNOSIS — Z23 Encounter for immunization: Secondary | ICD-10-CM | POA: Diagnosis not present

## 2024-11-01 DIAGNOSIS — F411 Generalized anxiety disorder: Secondary | ICD-10-CM

## 2024-11-01 MED ORDER — ROSUVASTATIN CALCIUM 10 MG PO TABS: 10.0000 mg | ORAL_TABLET | Freq: Every day | ORAL | 3 refills | Status: AC

## 2024-11-01 NOTE — Progress Notes (Signed)
 Subjective:    Patient ID: Amy Hutchinson, female    DOB: [birth year], 63 y.o.   MRN: [id number]  Amy Hutchinson is a 63 y.o. female presenting on 11/01/2024 for Annual Exam   HPI  Discussed the use of AI scribe software for clinical note transcription with the patient, who gave verbal consent to proceed.  History of Present Illness   Amy Hutchinson is a 63 year old female who presents for an annual physical exam.  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 Interested in repair  Pre-Diabetes A1c at 6.1, slight increase from 5.8 range She has fam history of Diabetes   HYPERLIPIDEMIA: - Reports no concerns. Last lipid panel 10/2024 improved significantly LDL 80 - Currently taking Rosuvastatin  10mg  nightly, tolerating well without side effects or myalgias   Generalized Anxiety Disorder Major Depression, chronic recurrent in remission Chronic history of anxiety disorder, can be sporadic triggers, sometimes unprovoked, has had long term issue with episodic anxiety flares and some panic. Followed by [facility] - Mliss NP on medication management currently with alprazolam  Continues on medication management with Alprazolam , Citalopram , Quetiapine    Cervical Neck Pain / Low Back Pain, Spinal Stenosis, Lumbar OA/DJD Chronic problem for years Followed by [facility].   Tobacco Abuse Currently smoking < 0.5 ppd. She admits concern with quit smoking and weight gain. In past she has quit cold turkey. Has NRT Patches at home.     Health Maintenance:   Due for Mammogram. Needs order   History of Cologuard. Previously did cologuard a few times  in the past, most recent was in [place] 06/2020 - Overdue Cologuard. Agrees to order today and use in future when ready   Due for Flu Shot, will receive today   Due Prevnar-20 today        11/01/2024    6:54 PM 09/22/2024   10:28 AM 09/01/2024    3:20 PM  Depression screen PHQ 2/9  Decreased Interest 0 0 0  Down, Depressed, Hopeless 0 0 0  PHQ - 2 Score 0 0 0  Altered sleeping 0    Tired, decreased energy 0    Change in appetite 0    Feeling bad or failure about yourself  0    Trouble concentrating 0    Moving slowly or fidgety/restless 0    Suicidal thoughts 0    PHQ-9 Score 0    Difficult doing work/chores Not difficult at all         09/01/2024    3:20 PM 01/08/2024    9:17 AM 03/28/2023   11:00 AM 09/12/2022   11:11 AM  GAD 7 : Generalized Anxiety Score  Nervous, Anxious, on Edge 0 0 0 0  Control/stop worrying 0 0 0 0  Worry too much - different things 0 0 0 0  Trouble relaxing 0 0 0 0  Restless 0 0 0 0  Easily annoyed or irritable 0 0 0 0  Afraid - awful might happen 0 0 0 0  Total GAD 7 Score 0 0 0 0  Anxiety Difficulty  Not difficult at all Not difficult at all Not difficult at all     Past Medical History:  Diagnosis Date   Anxiety    Back pain    Depression    Hyperlipidemia    Recurrent upper respiratory infection (URI)    Past Surgical History:  Procedure Laterality Date   ABDOMINAL HYSTERECTOMY     BIOPSY BREAST Left 2021   bx coil clip   BRAIN SURGERY     NECK SURGERY     TONSILLECTOMY AND ADENOIDECTOMY     Social History   Socioeconomic History   Marital status: Married    Spouse name: Not on file   Number of children: Not on file   Years of education: High School   Highest education level: High school graduate  Occupational History   Not on file  Tobacco Use   Smoking status: Every Day    Current packs/day: 0.40    Average packs/day: 0.4 packs/day for 32.0 years (12.8 ttl pk-yrs)    Types: Cigarettes   Smokeless tobacco: Never   Vaping Use   Vaping status: Never Used  Substance and Sexual Activity   Alcohol use: Not Currently   Drug use: Never   Sexual activity: Not Currently  Other Topics Concern   Not on file  Social History Narrative   Not on file   Social Drivers of Health   Financial Resource Strain: Not on file  Food Insecurity: No Food Insecurity (09/22/2024)   Hunger Vital Sign    Worried About Running Out of Food in the Last Year: Never true    Ran Out of Food in the Last Year: Never true  Transportation Needs: No Transportation Needs (09/22/2024)   PRAPARE - Administrator, Civil Service (Medical): No    Lack of Transportation (Non-Medical): No  Physical Activity: Not on file  Stress: Not on file  Social Connections: Moderately Isolated (12/20/2023)   Social Connection and Isolation Panel    Frequency of Communication with Friends and Family: More than three times a week    Frequency of Social Gatherings with Friends and Family: More than three times a week    Attends Religious Services: Never    Database Administrator or Organizations: No    Attends Banker Meetings: Never    Marital Status: Married  Catering Manager Violence: Not At Risk (09/22/2024)   Humiliation, Afraid, Hutchinson, and Kick questionnaire    Fear of Current or Ex-Partner: No    Emotionally Abused: No    Physically Abused: No    Sexually Abused: No   Family History  Problem Relation Age of Onset   Diabetes Mother    Bipolar disorder Mother    Anxiety disorder Mother    Depression Mother    Lung cancer Mother        lung   Anxiety disorder Sister    Depression Sister    Anxiety disorder Sister    Colon cancer Neg Hx    Breast cancer Neg Hx    Current Outpatient Medications on File Prior to Visit  Medication Sig   alprazolam  (XANAX ) 2 MG tablet Take 0.5 tablets (1 mg total) by mouth 4 (four) times daily as needed for sleep or anxiety.   citalopram  (CELEXA ) 40 MG tablet Take 1 tablet (40 mg  total) by mouth daily.   EPINEPHrine  0.3 mg/0.3 mL IJ SOAJ injection Inject 0.3 mg into the muscle as needed for anaphylaxis.   meclizine  (ANTIVERT ) 25 MG tablet Take 1 tablet (25 mg total) by mouth 3 (three) times daily as needed for dizziness.  montelukast  (SINGULAIR ) 10 MG tablet Take 1 tablet (10 mg total) by mouth at bedtime.   omalizumab  (XOLAIR ) 300 MG/2  ML prefilled syringe Inject 300 mg into the skin every 14 (fourteen) days.   QUEtiapine  (SEROQUEL ) 100 MG tablet Take 1 tablet (100 mg total) by mouth at bedtime.   Current Facility-Administered Medications on File Prior to Visit  Medication   omalizumab  (XOLAIR ) prefilled syringe 300 mg    Review of Systems  Constitutional:  Negative for activity change, appetite change, chills, diaphoresis, fatigue and fever.  HENT:  Negative for congestion and hearing loss.   Eyes:  Negative for visual disturbance.  Respiratory:  Negative for cough, chest tightness, shortness of breath and wheezing.   Cardiovascular:  Negative for chest pain, palpitations and leg swelling.  Gastrointestinal:  Negative for abdominal pain, constipation, diarrhea, nausea and vomiting.  Genitourinary:  Negative for dysuria, frequency and hematuria.  Musculoskeletal:  Negative for arthralgias and neck pain.  Skin:  Negative for rash.  Neurological:  Negative for dizziness, weakness, light-headedness, numbness and headaches.  Hematological:  Negative for adenopathy.  Psychiatric/Behavioral:  Negative for behavioral problems, dysphoric mood and sleep disturbance.    Per HPI unless specifically indicated above     Objective:    BP 122/68 (BP Location: Right Arm, Patient Position: Sitting, Cuff Size: Normal)   Pulse 91   Ht 5' 4 (1.626 m)   Wt 168 lb (76.2 kg)   SpO2 96%   BMI 28.84 kg/m   Wt Readings from Last 3 Encounters:  11/01/24 168 lb (76.2 kg)  09/21/24 166 lb (75.3 kg)  09/01/24 167 lb 2 oz (75.8 kg)    Physical Exam Vitals and nursing note  reviewed.  Constitutional:      General: She is not in acute distress.    Appearance: She is well-developed. She is not diaphoretic.     Comments: Well-appearing, comfortable, cooperative  HENT:     Head: Normocephalic and atraumatic.  Eyes:     General:        Right eye: No discharge.        Left eye: No discharge.     Conjunctiva/sclera: Conjunctivae normal.     Pupils: Pupils are equal, round, and reactive to light.  Neck:     Thyroid : No thyromegaly.  Cardiovascular:     Rate and Rhythm: Normal rate and regular rhythm.     Pulses: Normal pulses.     Heart sounds: Normal heart sounds. No murmur heard. Pulmonary:     Effort: Pulmonary effort is normal. No respiratory distress.     Breath sounds: Normal breath sounds. No wheezing or rales.  Abdominal:     General: Bowel sounds are normal. There is no distension.     Palpations: Abdomen is soft. There is no mass.     Tenderness: There is no abdominal tenderness.  Musculoskeletal:        General: No tenderness. Normal range of motion.     Cervical back: Normal range of motion and neck supple.     Comments: Upper / Lower Extremities: - Normal muscle tone, strength bilateral upper extremities 5/5, lower extremities 5/5  Lymphadenopathy:     Cervical: No cervical adenopathy.  Skin:    General: Skin is warm and dry.     Findings: No erythema or rash.  Neurological:     Mental Status: She is alert and oriented to person, place, and time.     Comments: Distal sensation intact to light touch  all extremities  Psychiatric:        Mood and Affect: Mood normal.        Behavior: Behavior normal.        Thought Content: Thought content normal.     Comments: Well groomed, good eye contact, normal speech and thoughts     Results for orders placed or performed in visit on 10/18/24  Comprehensive metabolic panel with GFR   Collection Time: 10/20/24  7:53 AM  Result Value Ref Range   Glucose, Bld 100 (H) 65 - 99 mg/dL   BUN 14 7 - 25  mg/dL   Creat 9.25 9.49 - 8.94 mg/dL   eGFR 92 > OR = 60 fO/fpw/8.26f7   BUN/Creatinine Ratio SEE NOTE: 6 - 22 (calc)   Sodium 142 135 - 146 mmol/L   Potassium 4.1 3.5 - 5.3 mmol/L   Chloride 110 98 - 110 mmol/L   CO2 22 20 - 32 mmol/L   Calcium  9.2 8.6 - 10.4 mg/dL   Total Protein 6.2 6.1 - 8.1 g/dL   Albumin 4.1 3.6 - 5.1 g/dL   Globulin 2.1 1.9 - 3.7 g/dL (calc)   AG Ratio 2.0 1.0 - 2.5 (calc)   Total Bilirubin 0.4 0.2 - 1.2 mg/dL   Alkaline phosphatase (APISO) 81 37 - 153 U/L   AST 12 10 - 35 U/L   ALT 15 6 - 29 U/L  CBC with Differential/Platelet   Collection Time: 10/20/24  7:53 AM  Result Value Ref Range   WBC 10.1 3.8 - 10.8 Thousand/uL   RBC 4.94 3.80 - 5.10 Million/uL   Hemoglobin 14.3 11.7 - 15.5 g/dL   HCT 55.8 64.9 - 54.9 %   MCV 89.3 80.0 - 100.0 fL   MCH 28.9 27.0 - 33.0 pg   MCHC 32.4 32.0 - 36.0 g/dL   RDW 86.8 88.9 - 84.9 %   Platelets 314 140 - 400 Thousand/uL   MPV 10.2 7.5 - 12.5 fL   Neutro Abs 5,323 1,500 - 7,800 cells/uL   Absolute Lymphocytes 4,040 (H) 850 - 3,900 cells/uL   Absolute Monocytes 505 200 - 950 cells/uL   Eosinophils Absolute 162 15 - 500 cells/uL   Basophils Absolute 71 0 - 200 cells/uL   Neutrophils Relative % 52.7 %   Total Lymphocyte 40.0 %   Monocytes Relative 5.0 %   Eosinophils Relative 1.6 %   Basophils Relative 0.7 %  Hemoglobin A1c   Collection Time: 10/20/24  7:53 AM  Result Value Ref Range   Hgb A1c MFr Bld 6.1 (H) <5.7 %   Mean Plasma Glucose 128 mg/dL   eAG (mmol/L) 7.1 mmol/L  Lipid panel   Collection Time: 10/20/24  7:53 AM  Result Value Ref Range   Cholesterol 144 <200 mg/dL   HDL 39 (L) > OR = 50 mg/dL   Triglycerides 847 (H) <150 mg/dL   LDL Cholesterol (Calc) 80 mg/dL (calc)   Total CHOL/HDL Ratio 3.7 <5.0 (calc)   Non-HDL Cholesterol (Calc) 105 <130 mg/dL (calc)  TSH   Collection Time: 10/20/24  7:53 AM  Result Value Ref Range   TSH 1.43 0.40 - 4.50 mIU/L      Assessment & Plan:   Problem List  Items Addressed This Visit     Foraminal stenosis of cervical region   GAD (generalized anxiety disorder)   Major depressive disorder, recurrent, in remission   Mixed hyperlipidemia   Relevant Medications   rosuvastatin  (CRESTOR ) 10 MG tablet   Pre-diabetes  Rectovaginal fistula   Relevant Orders   Ambulatory referral to Obstetrics / Gynecology   Screening for colon cancer   Relevant Orders   Cologuard   Other Visit Diagnoses       Annual physical exam    -  Primary     Needs flu shot       Relevant Orders   Flu vaccine trivalent PF, 6mos and older(Flulaval,Afluria,Fluarix,Fluzone ) (Completed)     Need for Streptococcus pneumoniae vaccination       Relevant Orders   Pneumococcal conjugate vaccine 20-valent (Completed)     Encounter for screening mammogram for malignant neoplasm of breast       Relevant Orders   MM 3D SCREENING MAMMOGRAM BILATERAL BREAST        Updated Health Maintenance information Reviewed recent lab results with patient Encouraged improvement to lifestyle with diet and exercise Goal of weight loss   Adult Wellness Visit Annual wellness visit conducted. Vitals normal. Heart, circulation, and lung sounds normal. - Continue current health maintenance and surveillance.  Immunizations: Influenza and Pneumococcal vaccination Influenza and pneumococcal vaccinations due. No history of allergic reactions to flu shot. Pneumococcal vaccine effective for five years. - Administered influenza vaccine. - Administered Prevnar 20 pneumococcal vaccine.  Screening for colon cancer Eligible for Cologuard screening every three years. Last done 2021, overdue since 2024. Declined today but agreed to future use. - Ordered Cologuard kit for future use.  Screening mammogram for breast cancer Mammogram due. Needs to schedule appointment. Ordered - Provided contact information for scheduling mammogram at [facility].  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 Symptoms include bowel movements exiting both rectally and vaginally.  - Referred to GYN specialist for evaluation of rectovaginal fistula. She may need colorectal surgeon in future or in conjunction with GYN  Prediabetes A1c 6.1 indicates prediabetes. Glucose level 100. Family history of diabetes. Lifestyle modifications recommended. - Advised limiting excess carbohydrates, starches, and sugars. - Will monitor A1c and glucose levels.  Mixed hyperlipidemia Cholesterol levels improved with treatment. LDL decreased from 104 to 80, triglycerides from 181 to 152. Rosuvastatin  effective. - Continue rosuvastatin  10 mg daily. - Added refills for rosuvastatin .     Major Depression recurrent in remission Anxiety Followed by [facility] On medication management with Alprazolam , Citalopram , Quetiapine     Orders Placed This Encounter  Procedures   MM 3D SCREENING MAMMOGRAM BILATERAL BREAST    Standing Status:   Future    Expiration Date:   11/01/2025    Reason for Exam (SYMPTOM  OR DIAGNOSIS REQUIRED):   Screening bilateral 3D Mammogram Tomo    Preferred imaging location?:   [facility]   Pneumococcal conjugate vaccine 20-valent   Flu vaccine trivalent PF, 6mos and older(Flulaval,Afluria,Fluarix,Fluzone )   Cologuard   Ambulatory referral to Obstetrics / Gynecology    Referral Priority:   Routine    Referral Type:   Consultation    Referral Reason:   Specialty Services Required    Requested Specialty:   Obstetrics and Gynecology    Number of Visits Requested:   1    Meds ordered this encounter  Medications   rosuvastatin  (CRESTOR ) 10 MG tablet    Sig: Take 1 tablet (10 mg total) by mouth at bedtime.    Dispense:  90 tablet    Refill:  3    Add refills     Follow up plan: Return in about 6 months (around 05/02/2025) for 6 month PreDM A1c.  Marsa Officer, DO [facility] 11/01/2024, 3:55  PM

## 2024-11-01 NOTE — Patient Instructions (Addendum)
 Thank you for coming to the office today.  For Mammogram screening for breast cancer   Call the Imaging Center below anytime to schedule your own appointment now that order has been placed.  [facility] at [facility] [address] [place], [place] [address] Phone: [phone]  --------  Referral to GYN specialist for evaluation of Rectovaginal Fistula.  [facility] at [facility] [address] [place],  [place]  [address] Main: [phone]  ------  Recent Labs    10/20/24 0753  HGBA1C 6.1*    Keep working on limiting excess carb starch sugar.  Flu Shot and Prevnar-20 pneumonia vaccine today!  Colon Cancer Screening:  Ordered the Cologuard (home kit) test for colon cancer screening. Stay tuned for further updates.  It will be shipped to you directly. If not received in 2-4 weeks, call us  or the company.   If you send it back and no results are received in 2-4 weeks, call us  or the company as well!   Colon Cancer Screening: - For all adults age 47+ routine colon cancer screening is highly recommended.     - Recent guidelines from American Cancer Society recommend starting age of 9 - Early detection of colon cancer is important, because often there are no warning signs or symptoms, also if found early usually it can be cured. Late stage is hard to treat.   - If Cologuard is NEGATIVE, then it is good for 3 years before next due - If Cologuard is POSITIVE, then it is strongly advised to get a Colonoscopy, which allows the GI doctor to locate the source of the cancer or polyp (even very early stage) and treat it by removing it. ------------------------- Follow instructions to collect sample, you may call the company for any help or questions, 24/7 telephone support at [phone].   Please schedule a Follow-up Appointment to: Return in about 6 months (around 05/02/2025) for 6 month PreDM  A1c.  If you have any other questions or concerns, please feel free to call the office or send a message through MyChart. You may also schedule an earlier appointment if necessary.  Additionally, you may be receiving a survey about your experience at our office within a few days to 1 week by e-mail or mail. We value your feedback.  Marsa Officer, DO [facility], [place]

## 2024-11-12 DIAGNOSIS — F3340 Major depressive disorder, recurrent, in remission, unspecified: Secondary | ICD-10-CM | POA: Diagnosis not present

## 2024-11-12 DIAGNOSIS — F410 Panic disorder [episodic paroxysmal anxiety] without agoraphobia: Secondary | ICD-10-CM | POA: Diagnosis not present

## 2024-11-12 DIAGNOSIS — F411 Generalized anxiety disorder: Secondary | ICD-10-CM | POA: Diagnosis not present

## 2024-11-26 ENCOUNTER — Encounter: Payer: Self-pay | Admitting: Emergency Medicine

## 2024-11-26 ENCOUNTER — Emergency Department
Admission: EM | Admit: 2024-11-26 | Discharge: 2024-11-26 | Disposition: A | Discharge status: Home / Self Care | Payer: Worker's Compensation | Attending: Emergency Medicine | Admitting: Emergency Medicine

## 2024-11-26 ENCOUNTER — Emergency Department: Payer: Worker's Compensation

## 2024-11-26 DIAGNOSIS — M25562 Pain in left knee: Secondary | ICD-10-CM | POA: Insufficient documentation

## 2024-11-26 DIAGNOSIS — M79675 Pain in left toe(s): Secondary | ICD-10-CM | POA: Diagnosis not present

## 2024-11-26 DIAGNOSIS — W19XXXA Unspecified fall, initial encounter: Secondary | ICD-10-CM

## 2024-11-26 DIAGNOSIS — M79672 Pain in left foot: Secondary | ICD-10-CM | POA: Diagnosis not present

## 2024-11-26 DIAGNOSIS — W010XXA Fall on same level from slipping, tripping and stumbling without subsequent striking against object, initial encounter: Secondary | ICD-10-CM | POA: Insufficient documentation

## 2024-11-26 DIAGNOSIS — Y990 Civilian activity done for income or pay: Secondary | ICD-10-CM | POA: Diagnosis not present

## 2024-11-26 NOTE — Discharge Instructions (Addendum)
 You have been seen in the Emergency Department (ED) today for a fall.  Your work up does not show any concerning injuries.  Please take over-the-counter ibuprofen and/or Tylenol  as needed for your pain (unless you have an allergy or your doctor as told you not to take them), or take any prescribed medication as instructed. Wear the knee brace for comfort as needed.  Please follow up with your doctor regarding today's Emergency Department (ED) visit and your recent fall if your symptoms are not resolving within a week.  Return to the ED if you have any headache, confusion, slurred speech, weakness/numbness of any arm or leg, any new or worsening symptoms.

## 2024-11-26 NOTE — ED Notes (Signed)
 Applied knee brace to pt affected knee on the left side. Pt stated it felt comfortable at this time. Pt given ortho order copy form.

## 2024-11-26 NOTE — ED Provider Notes (Signed)
 "  [facility] Provider Note    Event Date/Time   First MD Initiated Contact with Patient 11/26/24 2124     (approximate)   History   Fall and Knee Pain   HPI  Amy Hutchinson is a 63 y.o. female  with a past medical history of patient, hyperlipidemia, anxiety, psychophysiological insomnia, prediabetes orthostatic hypotension tension presents to the emergency department with fall that occurred today while working at [employer].  She reports left knee and toe pain.  She denies any other injuries, hitting her head, losing consciousness.  She reports she tripped over a box while at work and fell onto her knee.  Denies any history of knee surgeries.  Denies any numbness or tingling or open wound. Denies ankle pain.  Physical Exam   Triage Vital Signs: ED Triage Vitals  Encounter Vitals Group     BP 11/26/24 1759 117/74     Girls Systolic BP Percentile --      Girls Diastolic BP Percentile --      Boys Systolic BP Percentile --      Boys Diastolic BP Percentile --      Pulse Rate 11/26/24 1759 81     Resp 11/26/24 1759 16     Temp 11/26/24 1759 98.2 F (36.8 C)     Temp Source 11/26/24 1759 Oral     SpO2 11/26/24 1751 98 %     Weight 11/26/24 1800 167 lb 8.8 oz (76 kg)     Height 11/26/24 1800 5' 4 (1.626 m)     Head Circumference --      Peak Flow --      Pain Score 11/26/24 1751 7     Pain Loc --      Pain Education --      Exclude from Growth Chart --     Most recent vital signs: Vitals:   11/26/24 1759 11/26/24 2211  BP: 117/74 133/89  Pulse: 81 69  Resp: 16 16  Temp: 98.2 F (36.8 C) 97.8 F (36.6 C)  SpO2: 95% 96%    General: Awake, in no acute distress. Appears stated age. Head: Normocephalic, atraumatic. Neck: Supple. CV: Good peripheral perfusion. Respiratory:Normal respiratory effort.  No respiratory distress.  GI: Soft, non-distended, non-tender.  MSK: Normal ROM and  5/5 strength in b/l upper and lower extremities. TTP along medial  left knee and anterior aspect of all left toes. No open wound. No swelling or erythema. Skin:Warm, dry, intact.  Neurological: A&Ox4 to person, place, time, and situation. Sensation intact and equal to b/l upper and lower extremities. Strength symmetric. No focal deficits.   ED Results / Procedures / Treatments   Labs (all labs ordered are listed, but only abnormal results are displayed) Labs Reviewed - No data to display   EKG     RADIOLOGY X ray left knee FINDINGS: No evidence of fracture, dislocation, or joint effusion. The alignment and joint spaces are preserved. No evidence of arthropathy or other focal bone abnormality. Soft tissues are unremarkable.   IMPRESSION: Negative radiographs of the left knee.  X ray left foot FINDINGS: No evidence of fracture, dislocation, or joint effusion. The alignment and joint spaces are preserved. No evidence of arthropathy or other focal bone abnormality. Soft tissues are unremarkable.   IMPRESSION: Negative radiographs of the left knee.  PROCEDURES:  Critical Care performed: No   Procedures   MEDICATIONS ORDERED IN ED: Medications - No data to display   IMPRESSION / MDM /  ASSESSMENT AND PLAN / ED COURSE  I reviewed the triage vital signs and the nursing notes.                              Differential diagnosis includes, but is not limited to, mechanical fall, knee sprain, knee fracture or dislocation, foot or toe sprain, foot or toe fracture  Patient's presentation is most consistent with acute complicated illness / injury requiring diagnostic workup.  Patient is a 63 year old female presenting following a fall that occurred at [facility].  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  She does not have any paperwork with her at this time.  X-rays of left knee and foot ordered in triage.  She is neurovascularly intact and able to ambulate without assistance. I independently viewed the x-rays and  radiologist's report.  I agree with the radiologist's reports there are no acute fractures or dislocations.  Provided knee brace for support.  She does have new, supportive tennis shoes that I told her to continue to wear.  Discussed OTC medication use at home for pain as well as RICE. She can follow-up with her primary care provider as needed following today's visit.  The patient may return to the emergency department for any new, worsening, or concerning symptoms. Patient was given the opportunity to ask questions; all questions were answered. Emergency department return precautions were discussed with the patient.  Patient is in agreement to the treatment plan.  Patient is stable for discharge.   FINAL CLINICAL IMPRESSION(S) / ED DIAGNOSES   Final diagnoses:  Acute pain of left knee  Fall, initial encounter     Rx / DC Orders   ED Discharge Orders     None        Note:  This document was prepared using Dragon voice recognition software and may include unintentional dictation errors.     Sheron Salm, PA-C 11/27/24 1507    Floy Roberts, MD 11/27/24 2259  "

## 2024-11-26 NOTE — ED Notes (Signed)
 Pt left exam room ambulatory. Pt verbalizes understanding of discharge instructions

## 2024-11-26 NOTE — ED Triage Notes (Signed)
 Pt arrives via EMS from work with reports of right knee pain. Pt tripped on a box. Denies LOC.

## 2024-11-28 ENCOUNTER — Other Ambulatory Visit: Payer: Self-pay | Admitting: Family Medicine

## 2024-11-28 DIAGNOSIS — R42 Dizziness and giddiness: Secondary | ICD-10-CM

## 2024-11-30 NOTE — Telephone Encounter (Signed)
 Requested medication (s) are due for refill today: yes  Requested medication (s) are on the active medication list: yes  Last refill:  01/08/24  Future visit scheduled: yes  Notes to clinic:  Unable to refill per protocol, cannot delegate.      Requested Prescriptions  Pending Prescriptions Disp Refills   meclizine  (ANTIVERT ) 25 MG tablet [Pharmacy Med Name: Meclizine  HCl 25 MG Oral Tablet] 30 tablet 0    Sig: TAKE 1 TABLET BY MOUTH THREE TIMES DAILY AS NEEDED FOR DIZZINESS     Not Delegated - Gastroenterology: Antiemetics Failed - 11/30/2024 12:41 PM      Failed - This refill cannot be delegated      Passed - Valid encounter within last 6 months    Recent Outpatient Visits           4 weeks ago Annual physical exam   [facility] [place], Marsa PARAS, DO   3 months ago Orthostatic hypotension   [facility] [place] Marsa PARAS, DO   10 months ago Syncope, unspecified syncope type   [facility] [place], Marsa PARAS, [place]

## 2024-12-09 ENCOUNTER — Ambulatory Visit: Payer: Self-pay

## 2024-12-09 DIAGNOSIS — G8929 Other chronic pain: Secondary | ICD-10-CM

## 2024-12-09 DIAGNOSIS — G5792 Unspecified mononeuropathy of left lower limb: Secondary | ICD-10-CM

## 2024-12-09 NOTE — Telephone Encounter (Addendum)
 Copied from CRM #[id number]. Topic: Clinical - Red Word Triage >> Dec 09, 2024  1:55 PM Antwanette L wrote: Red Word that prompted transfer to Nurse Triage: . She reports pain and burning in the last three toes of her left foot and is requesting a referral for a podiatrist Answer Assessment - Initial Assessment Questions 1. ONSET: When did the pain start?      Chronic, getting worse 2. LOCATION: Where is the pain located?   (e.g., around nail, entire toe, at foot joint)       3. PAIN: How bad is the pain?    (Scale 1-10; or mild, moderate, severe)     10/10 4/10 4. APPEARANCE: What does the toe look like? (e.g., redness, swelling, bruising, pallor)      5. CAUSE: What do you think is causing the toe pain?      6. OTHER SYMPTOMS: Do you have any other symptoms? (e.g., leg pain, rash, fever, numbness)    denies  Protocols used: Toe Pain-A-AH Scheduled 12/10/24 Advised call back or ED/911 if symptoms worsen. Patient verbalized understanding.   Answer Assessment - Initial Assessment Questions 1. ONSET: When did the pain start?      Chronic, getting worse 2. LOCATION: Where is the pain located?   (e.g., around nail, entire toe, at foot joint)      Right foot; 3 last toes 3. PAIN: How bad is the pain?    (Scale 1-10; or mild, moderate, severe)    4/10 now, 10/10 at worst 4. APPEARANCE: What does the toe look like? (e.g., redness, swelling, bruising, pallor)     Slightly redness; denies swelling, cool/ hot touch, blue, drainage 5. CAUSE: What do you think is causing the toe pain?     unsure 6. OTHER SYMPTOMS: Do you have any other symptoms? (e.g., leg pain, rash, fever, numbness)    Denies leg pain, fever chills, n/v rash, weakness/numbness

## 2024-12-09 NOTE — Telephone Encounter (Signed)
 Patient advised.

## 2024-12-09 NOTE — Telephone Encounter (Signed)
 I saw her recently in December for yearly visit and I checked my notes in the past I have documented Left Toe pain. I can go ahead and place this referral.  ----------------  Please let her know that I have placed referral.  [facility] Address: [address] [place], [place] [address] Phone: [phone]  They will contact her with appointment.  Marsa Officer, DO [facility] 12/09/2024, 3:09 PM

## 2024-12-09 NOTE — Telephone Encounter (Signed)
 Reason for Disposition  Toe pain is a chronic symptom (recurrent or ongoing AND present > 4 weeks)  Protocols used: Toe Pain-A-AH

## 2024-12-10 ENCOUNTER — Ambulatory Visit: Admitting: Internal Medicine

## 2024-12-21 ENCOUNTER — Ambulatory Visit: Admitting: Podiatry

## 2024-12-21 ENCOUNTER — Ambulatory Visit
Admission: RE | Admit: 2024-12-21 | Discharge: 2024-12-21 | Disposition: A | Discharge status: Home / Self Care | Source: Ambulatory Visit | Attending: Family Medicine | Admitting: Family Medicine

## 2024-12-21 DIAGNOSIS — Z8739 Personal history of other diseases of the musculoskeletal system and connective tissue: Secondary | ICD-10-CM | POA: Diagnosis not present

## 2024-12-21 DIAGNOSIS — Q666 Other congenital valgus deformities of feet: Secondary | ICD-10-CM | POA: Diagnosis not present

## 2024-12-21 DIAGNOSIS — Z1231 Encounter for screening mammogram for malignant neoplasm of breast: Secondary | ICD-10-CM | POA: Insufficient documentation

## 2024-12-21 DIAGNOSIS — G5732 Lesion of lateral popliteal nerve, left lower limb: Secondary | ICD-10-CM

## 2024-12-21 MED ORDER — PREGABALIN 100 MG PO CAPS: 100.0000 mg | ORAL_CAPSULE | Freq: Two times a day (BID) | ORAL | 0 refills | Status: DC

## 2024-12-21 NOTE — Progress Notes (Unsigned)
 Numbness to left third fourth fifth digit superficial peroneal neuritis discussed shoe gear modification discussed orthotics  Nerve conduction study history of lower back pain  Pesplanovalgus orhotics

## 2025-01-04 ENCOUNTER — Ambulatory Visit: Payer: Self-pay

## 2025-01-04 NOTE — Telephone Encounter (Signed)
 FYI Only or Action Required?: FYI only for provider: appointment scheduled on 01/25/25.  Patient was last seen in primary care on 11/01/2024 by [place] Marsa PARAS, DO.  Called Nurse Triage reporting Dizziness.  Symptoms began several months ago.  Interventions attempted: Prescription medications: Meclizine .  Symptoms are: unchanged.  Triage Disposition: See PCP When Office is Open (Within 3 Days)  Patient/caregiver understands and will follow disposition?: Yes  Reason for Disposition  [1] MODERATE dizziness (e.g., vertigo; feels very unsteady, interferes with normal activities) AND [2] has been evaluated by doctor (or NP/PA) for this  Answer Assessment - Initial Assessment Questions Was given a new medication from Podiatrist, Pregabalin , started last week. Pt reports BP has been good  1. DESCRIPTION: Describe your dizziness.     Really dizzy will often bump into things  2. VERTIGO: Do you feel like either you or the room is spinning or tilting?      Room is spinning  3. LIGHTHEADED: Do you feel lightheaded? (e.g., somewhat faint, woozy, weak upon standing)     Denies   4. SEVERITY: How bad is it?  Can you walk?     Yes, experiencing some balance issues and bumping into things.   5. ONSET:  When did the dizziness begin?     Few days  6. AGGRAVATING FACTORS: Does anything make it worse? (e.g., standing, change in head position)     Constant  7. CAUSE: What do you think is causing the dizziness?     Unsure  8. RECURRENT SYMPTOM: Have you had dizziness before? If Yes, ask: When was the last time? What happened that time?     Yes, fainted last year, went to ED and was admitted, takes Meclizine , no relief.  9. OTHER SYMPTOMS: Do you have any other symptoms? (e.g., earache, headache, numbness, tinnitus, vomiting, weakness)     Sometimes experiences double vision, last time was a couple weeks ago  Protocols used: Dizziness -  Vertigo-A-AH  Message from [facility] G sent at 01/04/2025  3:49 PM EST  Reason for Triage: constant dizzy, bumping into things

## 2025-01-05 ENCOUNTER — Encounter: Payer: Self-pay | Admitting: Family Medicine

## 2025-01-05 ENCOUNTER — Ambulatory Visit: Admitting: Family Medicine

## 2025-01-05 VITALS — BP 124/70 | HR 84 | Ht 64.0 in | Wt 167.0 lb

## 2025-01-05 DIAGNOSIS — F3340 Major depressive disorder, recurrent, in remission, unspecified: Secondary | ICD-10-CM

## 2025-01-05 DIAGNOSIS — H8193 Unspecified disorder of vestibular function, bilateral: Secondary | ICD-10-CM

## 2025-01-05 DIAGNOSIS — R42 Dizziness and giddiness: Secondary | ICD-10-CM

## 2025-01-05 DIAGNOSIS — F411 Generalized anxiety disorder: Secondary | ICD-10-CM

## 2025-01-05 MED ORDER — MECLIZINE HCL 25 MG PO TABS: 25.0000 mg | ORAL_TABLET | Freq: Three times a day (TID) | ORAL | 3 refills | Status: AC | PRN

## 2025-01-05 NOTE — Progress Notes (Signed)
 "  Subjective:    Patient ID: Amy Hutchinson, female    DOB: 1961/08/26, 64 y.o.   MRN: [id number]  Amy Hutchinson is a 63 y.o. female presenting on 01/05/2025 for Dizziness (About 1 month)   HPI  Discussed the use of AI scribe software for clinical note transcription with the patient, who gave verbal consent to proceed.  History of Present Illness   [name] is a 64 year old female who presents with worsening dizziness.  Chronic Dizziness - Constant dizziness for >3+ years but worsening over one month, worsening compared to previous episodes - Occurs in all positions: lying down, sitting up, and standing - Persistent throughout the day - Interferes with ability to work and perform daily activities - Previously experienced intermittent episodes, now persistent - No relief with meclizine , which previously provided some benefit but now causes nausea - Discontinued pregabalin  100 mg after two doses due to concern for increased dizziness and lack of improvement - Tried midodrine  for suspected low blood pressure; blood pressure remains normal and medication was not helpful  Fall and injury - Sustained a fall at work due to dizziness - Resulted in a bruised knee without significant injuries  Prior diagnostic evaluation Cardiology ENT and Neurology over the years - Echocardiogram, Doppler studies, ultrasound of the neck, MRI of the brain, and Zio patch heart monitor performed - No etiology identified for dizziness - Evaluated by ENT and cardiology specialists  Psychiatry Major Depression recurrent in remission Anxiety Followed by [facility] Current medications - Seroquel  (quetiapine ) and alprazolam  taken at night  She says medications help her mental health prefers to avoid changing if causing side effect          01/05/2025    6:32 PM 01/05/2025    2:43 PM 11/01/2024    6:54 PM  Depression screen PHQ 2/9  Decreased Interest 0 0 0  Down, Depressed, Hopeless 0 0 0   PHQ - 2 Score 0 0 0  Altered sleeping 0  0  Tired, decreased energy 0  0  Change in appetite 0  0  Feeling bad or failure about yourself  0  0  Trouble concentrating 0  0  Moving slowly or fidgety/restless 0  0  Suicidal thoughts 0  0  PHQ-9 Score 0  0  Difficult doing work/chores Not difficult at all  Not difficult at all       01/05/2025    2:43 PM 09/01/2024    3:20 PM 01/08/2024    9:17 AM 03/28/2023   11:00 AM  GAD 7 : Generalized Anxiety Score  Nervous, Anxious, on Edge 0 0  0  0   Control/stop worrying 0 0  0  0   Worry too much - different things 0 0  0  0   Trouble relaxing 0 0  0  0   Restless 0 0  0  0   Easily annoyed or irritable 0 0  0  0   Afraid - awful might happen 0 0  0  0   Total GAD 7 Score 0 0 0 0  Anxiety Difficulty   Not difficult at all Not difficult at all     Data saved with a previous flowsheet row definition    Social History[1]  Review of Systems Per HPI unless specifically indicated above     Objective:    BP 124/70 (BP Location: Left Arm, Patient Position: Sitting, Cuff Size: Normal)   Pulse 84  Ht 5' 4 (1.626 m)   Wt 167 lb (75.8 kg)   SpO2 98%   BMI 28.67 kg/m   Wt Readings from Last 3 Encounters:  01/05/25 167 lb (75.8 kg)  11/26/24 167 lb 8.8 oz (76 kg)  11/01/24 168 lb (76.2 kg)    Physical Exam Vitals and nursing note reviewed.  Constitutional:      General: She is not in acute distress.    Appearance: She is well-developed. She is not diaphoretic.     Comments: Well-appearing, comfortable, cooperative  HENT:     Head: Normocephalic and atraumatic.  Eyes:     General:        Right eye: No discharge.        Left eye: No discharge.     Conjunctiva/sclera: Conjunctivae normal.  Neck:     Thyroid : No thyromegaly.  Cardiovascular:     Rate and Rhythm: Normal rate and regular rhythm.     Heart sounds: Normal heart sounds. No murmur heard. Pulmonary:     Effort: Pulmonary effort is normal. No respiratory distress.      Breath sounds: Normal breath sounds. No wheezing or rales.  Musculoskeletal:        General: Normal range of motion.     Cervical back: Normal range of motion and neck supple.  Lymphadenopathy:     Cervical: No cervical adenopathy.  Skin:    General: Skin is warm and dry.     Findings: No erythema or rash.  Neurological:     Mental Status: She is alert and oriented to person, place, and time.  Psychiatric:        Behavior: Behavior normal.     Comments: Well groomed, good eye contact, normal speech and thoughts     Relevant CVD History - TTE 01/2024 normal biventricular function and no valvular disease - Normal carotid Doppler 12/2023 - MRI brain 12/2023 without any abnormal findings  I have personally reviewed the radiology report from 12/19/23 MRI BRAIN  CLINICAL DATA:  Recurrent syncope   EXAM: MRI HEAD WITHOUT CONTRAST   TECHNIQUE: Multiplanar, multiecho pulse sequences of the brain and surrounding structures were obtained without intravenous contrast.   COMPARISON:  None Available.   FINDINGS: Brain: No acute infarct, mass effect or extra-axial collection. No acute or chronic hemorrhage. Normal white matter signal, parenchymal volume and CSF spaces. The midline structures are normal.   Vascular: Normal flow voids.   Skull and upper cervical spine: Normal calvarium and skull base. Visualized upper cervical spine and soft tissues are normal.   Sinuses/Orbits:No paranasal sinus fluid levels or advanced mucosal thickening. No mastoid or middle ear effusion. Normal orbits.   IMPRESSION: Normal brain MRI.     Electronically Signed   By: Franky Stanford M.D.   On: 12/19/2023 23:40  Results for orders placed or performed in visit on 10/18/24  Comprehensive metabolic panel with GFR   Collection Time: 10/20/24  7:53 AM  Result Value Ref Range   Glucose, Bld 100 (H) 65 - 99 mg/dL   BUN 14 7 - 25 mg/dL   Creat 9.25 9.49 - 8.94 mg/dL   eGFR 92 > OR = 60 fO/fpw/8.26f7    BUN/Creatinine Ratio SEE NOTE: 6 - 22 (calc)   Sodium 142 135 - 146 mmol/L   Potassium 4.1 3.5 - 5.3 mmol/L   Chloride 110 98 - 110 mmol/L   CO2 22 20 - 32 mmol/L   Calcium  9.2 8.6 - 10.4 mg/dL   Total  Protein 6.2 6.1 - 8.1 g/dL   Albumin 4.1 3.6 - 5.1 g/dL   Globulin 2.1 1.9 - 3.7 g/dL (calc)   AG Ratio 2.0 1.0 - 2.5 (calc)   Total Bilirubin 0.4 0.2 - 1.2 mg/dL   Alkaline phosphatase (APISO) 81 37 - 153 U/L   AST 12 10 - 35 U/L   ALT 15 6 - 29 U/L  CBC with Differential/Platelet   Collection Time: 10/20/24  7:53 AM  Result Value Ref Range   WBC 10.1 3.8 - 10.8 Thousand/uL   RBC 4.94 3.80 - 5.10 Million/uL   Hemoglobin 14.3 11.7 - 15.5 g/dL   HCT 55.8 64.9 - 54.9 %   MCV 89.3 80.0 - 100.0 fL   MCH 28.9 27.0 - 33.0 pg   MCHC 32.4 32.0 - 36.0 g/dL   RDW 86.8 88.9 - 84.9 %   Platelets 314 140 - 400 Thousand/uL   MPV 10.2 7.5 - 12.5 fL   Neutro Abs 5,323 1,500 - 7,800 cells/uL   Absolute Lymphocytes 4,040 (H) 850 - 3,900 cells/uL   Absolute Monocytes 505 200 - 950 cells/uL   Eosinophils Absolute 162 15 - 500 cells/uL   Basophils Absolute 71 0 - 200 cells/uL   Neutrophils Relative % 52.7 %   Total Lymphocyte 40.0 %   Monocytes Relative 5.0 %   Eosinophils Relative 1.6 %   Basophils Relative 0.7 %  Hemoglobin A1c   Collection Time: 10/20/24  7:53 AM  Result Value Ref Range   Hgb A1c MFr Bld 6.1 (H) <5.7 %   Mean Plasma Glucose 128 mg/dL   eAG (mmol/L) 7.1 mmol/L  Lipid panel   Collection Time: 10/20/24  7:53 AM  Result Value Ref Range   Cholesterol 144 <200 mg/dL   HDL 39 (L) > OR = 50 mg/dL   Triglycerides 847 (H) <150 mg/dL   LDL Cholesterol (Calc) 80 mg/dL (calc)   Total CHOL/HDL Ratio 3.7 <5.0 (calc)   Non-HDL Cholesterol (Calc) 105 <130 mg/dL (calc)  TSH   Collection Time: 10/20/24  7:53 AM  Result Value Ref Range   TSH 1.43 0.40 - 4.50 mIU/L      Assessment & Plan:   Problem List Items Addressed This Visit     GAD (generalized anxiety disorder)    Major depressive disorder, recurrent, in remission   Other Visit Diagnoses       Dizziness    -  Primary   Relevant Medications   meclizine  (ANTIVERT ) 25 MG tablet   Other Relevant Orders   Ambulatory referral to Physical Therapy     Vestibular dysfunction of both ears       Relevant Orders   Ambulatory referral to Physical Therapy        Vestibular dysfunction Chronic dizziness with no clear etiology >3+ years, this is not acute, but it is worsening lately. Previously evaluated by ENT, cardiology, and neurology. Extensive work up imaging MRI CT ECHO EKG ZIO monitor Meclizine  mild relief but not resolving. Normal blood pressure. Not orthostatic, off midodrine . Suspected vestibular dysfunction.  - Referred to vestibular therapist PT for evaluation and treatment. - Continue meclizine  until vestibular therapy appointment.  Major Depression recurrent in remission Anxiety Followed by [facility] On seroquel  / alprazolam  - Consider medication review with psychiatry if vestibular therapy is unsuccessful.  We discussed maybe medication side effect with Seroquel /Quetiapine  and or Alprazolam  however she prefers to avoid medication change.       Refer to [facility] [place] PT Jason  Huprich PT vestibular PT    Orders Placed This Encounter  Procedures   Ambulatory referral to Physical Therapy    Referral Priority:   Routine    Referral Type:   Physical Medicine    Referral Reason:   Specialty Services Required    Requested Specialty:   Physical Therapy    Number of Visits Requested:   1    Meds ordered this encounter  Medications   meclizine  (ANTIVERT ) 25 MG tablet    Sig: Take 1 tablet (25 mg total) by mouth 3 (three) times daily as needed for dizziness.    Dispense:  90 tablet    Refill:  3    Follow up plan: Return if symptoms worsen or fail to improve.  Marsa Officer, DO [facility] 01/05/2025, 2:21 PM      [1]  Social History Tobacco Use   Smoking status: Every Day    Current packs/day: 0.40    Average packs/day: 0.4 packs/day for 32.0 years (12.8 ttl pk-yrs)    Types: Cigarettes   Smokeless tobacco: Never  Vaping Use   Vaping status: Never Used  Substance Use Topics   Alcohol use: Not Currently   Drug use: Never   "

## 2025-01-05 NOTE — Patient Instructions (Addendum)
 Thank you for coming to the office today.  Referral to Selinda Eck PT - Vestibular Balance Therapist in [place].  [facility] [address] [place], [place] [address] [phone]  Review of medications today I do still think that the Seroquel  Quetapine can cause a dizziness side effect, so possibly this is related. If not successful with Vestibular therapy PT, we can consider talking to your Psychiatry to find alternative medications that don't cause dizziness to get worse.  Okay to keep taking Meclizine  for now  Please schedule a Follow-up Appointment to: Return if symptoms worsen or fail to improve.  If you have any other questions or concerns, please feel free to call the office or send a message through MyChart. You may also schedule an earlier appointment if necessary.  Additionally, you may be receiving a survey about your experience at our office within a few days to 1 week by e-mail or mail. We value your feedback.  Marsa Officer, DO [facility], [place]

## 2025-01-26 ENCOUNTER — Ambulatory Visit

## 2025-05-02 ENCOUNTER — Ambulatory Visit: Admitting: Family Medicine
